# Patient Record
Sex: Female | Born: 1940 | Race: White | Hispanic: No | Smoking: Never smoker
Health system: Southern US, Community
[De-identification: ages and names within clinical notes are randomized; demographics above are authoritative.]

## PROBLEM LIST (undated history)

## (undated) DIAGNOSIS — J449 Chronic obstructive pulmonary disease, unspecified: Secondary | ICD-10-CM

## (undated) DIAGNOSIS — Z87442 Personal history of urinary calculi: Secondary | ICD-10-CM

## (undated) DIAGNOSIS — F0280 Dementia in other diseases classified elsewhere without behavioral disturbance: Secondary | ICD-10-CM

## (undated) DIAGNOSIS — G309 Alzheimer's disease, unspecified: Secondary | ICD-10-CM

## (undated) DIAGNOSIS — D72829 Elevated white blood cell count, unspecified: Secondary | ICD-10-CM

## (undated) DIAGNOSIS — M069 Rheumatoid arthritis, unspecified: Secondary | ICD-10-CM

## (undated) DIAGNOSIS — I10 Essential (primary) hypertension: Secondary | ICD-10-CM

## (undated) DIAGNOSIS — F0390 Unspecified dementia without behavioral disturbance: Secondary | ICD-10-CM

## (undated) DIAGNOSIS — R0600 Dyspnea, unspecified: Secondary | ICD-10-CM

## (undated) DIAGNOSIS — F419 Anxiety disorder, unspecified: Secondary | ICD-10-CM

## (undated) DIAGNOSIS — E876 Hypokalemia: Secondary | ICD-10-CM

## (undated) DIAGNOSIS — K5900 Constipation, unspecified: Secondary | ICD-10-CM

## (undated) DIAGNOSIS — F329 Major depressive disorder, single episode, unspecified: Secondary | ICD-10-CM

## (undated) DIAGNOSIS — M1990 Unspecified osteoarthritis, unspecified site: Secondary | ICD-10-CM

## (undated) DIAGNOSIS — D649 Anemia, unspecified: Secondary | ICD-10-CM

## (undated) DIAGNOSIS — F32A Depression, unspecified: Secondary | ICD-10-CM

## (undated) DIAGNOSIS — L409 Psoriasis, unspecified: Secondary | ICD-10-CM

## (undated) DIAGNOSIS — E538 Deficiency of other specified B group vitamins: Secondary | ICD-10-CM

## (undated) DIAGNOSIS — E559 Vitamin D deficiency, unspecified: Secondary | ICD-10-CM

## (undated) HISTORY — DX: Elevated white blood cell count, unspecified: D72.829

## (undated) HISTORY — DX: Vitamin D deficiency, unspecified: E55.9

## (undated) HISTORY — PX: TOE SURGERY: SHX1073

## (undated) HISTORY — DX: Psoriasis, unspecified: L40.9

## (undated) HISTORY — PX: APPENDECTOMY: SHX54

## (undated) HISTORY — DX: Unspecified dementia, unspecified severity, without behavioral disturbance, psychotic disturbance, mood disturbance, and anxiety: F03.90

## (undated) HISTORY — PX: OTHER SURGICAL HISTORY: SHX169

## (undated) HISTORY — DX: Dyspnea, unspecified: R06.00

---
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
## 2012-01-27 DIAGNOSIS — I10 Essential (primary) hypertension: Secondary | ICD-10-CM | POA: Diagnosis not present

## 2012-01-27 DIAGNOSIS — J449 Chronic obstructive pulmonary disease, unspecified: Secondary | ICD-10-CM | POA: Diagnosis not present

## 2012-01-27 DIAGNOSIS — R413 Other amnesia: Secondary | ICD-10-CM | POA: Diagnosis not present

## 2012-01-30 DIAGNOSIS — R413 Other amnesia: Secondary | ICD-10-CM | POA: Diagnosis not present

## 2012-01-30 DIAGNOSIS — G3184 Mild cognitive impairment, so stated: Secondary | ICD-10-CM | POA: Diagnosis not present

## 2012-02-14 DIAGNOSIS — R413 Other amnesia: Secondary | ICD-10-CM | POA: Diagnosis not present

## 2012-02-28 DIAGNOSIS — L408 Other psoriasis: Secondary | ICD-10-CM | POA: Diagnosis not present

## 2012-03-13 DIAGNOSIS — R413 Other amnesia: Secondary | ICD-10-CM | POA: Diagnosis not present

## 2012-03-13 DIAGNOSIS — R42 Dizziness and giddiness: Secondary | ICD-10-CM | POA: Diagnosis not present

## 2012-03-22 DIAGNOSIS — R42 Dizziness and giddiness: Secondary | ICD-10-CM | POA: Diagnosis not present

## 2012-05-03 DIAGNOSIS — R42 Dizziness and giddiness: Secondary | ICD-10-CM | POA: Diagnosis not present

## 2012-06-13 DIAGNOSIS — N390 Urinary tract infection, site not specified: Secondary | ICD-10-CM | POA: Diagnosis not present

## 2012-06-13 DIAGNOSIS — Z0000 Encounter for general adult medical examination without abnormal findings: Secondary | ICD-10-CM | POA: Diagnosis not present

## 2012-06-13 DIAGNOSIS — I10 Essential (primary) hypertension: Secondary | ICD-10-CM | POA: Diagnosis not present

## 2012-06-13 DIAGNOSIS — E785 Hyperlipidemia, unspecified: Secondary | ICD-10-CM | POA: Diagnosis not present

## 2012-06-13 DIAGNOSIS — R7989 Other specified abnormal findings of blood chemistry: Secondary | ICD-10-CM | POA: Diagnosis not present

## 2012-06-13 DIAGNOSIS — Z23 Encounter for immunization: Secondary | ICD-10-CM | POA: Diagnosis not present

## 2012-08-07 DIAGNOSIS — F0390 Unspecified dementia without behavioral disturbance: Secondary | ICD-10-CM | POA: Diagnosis not present

## 2012-08-10 DIAGNOSIS — F339 Major depressive disorder, recurrent, unspecified: Secondary | ICD-10-CM | POA: Diagnosis not present

## 2012-08-10 DIAGNOSIS — F429 Obsessive-compulsive disorder, unspecified: Secondary | ICD-10-CM | POA: Diagnosis not present

## 2012-08-13 DIAGNOSIS — F429 Obsessive-compulsive disorder, unspecified: Secondary | ICD-10-CM | POA: Diagnosis not present

## 2012-08-13 DIAGNOSIS — F339 Major depressive disorder, recurrent, unspecified: Secondary | ICD-10-CM | POA: Diagnosis not present

## 2012-08-22 DIAGNOSIS — F339 Major depressive disorder, recurrent, unspecified: Secondary | ICD-10-CM | POA: Diagnosis not present

## 2012-08-22 DIAGNOSIS — F429 Obsessive-compulsive disorder, unspecified: Secondary | ICD-10-CM | POA: Diagnosis not present

## 2012-08-24 DIAGNOSIS — F339 Major depressive disorder, recurrent, unspecified: Secondary | ICD-10-CM | POA: Diagnosis not present

## 2012-08-24 DIAGNOSIS — F429 Obsessive-compulsive disorder, unspecified: Secondary | ICD-10-CM | POA: Diagnosis not present

## 2012-09-13 DIAGNOSIS — F0390 Unspecified dementia without behavioral disturbance: Secondary | ICD-10-CM | POA: Diagnosis not present

## 2012-09-17 DIAGNOSIS — F411 Generalized anxiety disorder: Secondary | ICD-10-CM | POA: Diagnosis not present

## 2012-09-21 DIAGNOSIS — R413 Other amnesia: Secondary | ICD-10-CM | POA: Diagnosis not present

## 2012-09-25 DIAGNOSIS — R413 Other amnesia: Secondary | ICD-10-CM | POA: Diagnosis not present

## 2012-09-27 DIAGNOSIS — Z6834 Body mass index (BMI) 34.0-34.9, adult: Secondary | ICD-10-CM | POA: Diagnosis not present

## 2012-09-27 DIAGNOSIS — F0390 Unspecified dementia without behavioral disturbance: Secondary | ICD-10-CM | POA: Diagnosis not present

## 2012-09-27 DIAGNOSIS — J45909 Unspecified asthma, uncomplicated: Secondary | ICD-10-CM | POA: Diagnosis not present

## 2012-09-27 DIAGNOSIS — E785 Hyperlipidemia, unspecified: Secondary | ICD-10-CM | POA: Diagnosis not present

## 2012-09-27 DIAGNOSIS — I10 Essential (primary) hypertension: Secondary | ICD-10-CM | POA: Diagnosis not present

## 2012-09-27 DIAGNOSIS — IMO0002 Reserved for concepts with insufficient information to code with codable children: Secondary | ICD-10-CM | POA: Diagnosis not present

## 2012-10-16 DIAGNOSIS — R413 Other amnesia: Secondary | ICD-10-CM | POA: Diagnosis not present

## 2012-10-16 DIAGNOSIS — F411 Generalized anxiety disorder: Secondary | ICD-10-CM | POA: Diagnosis not present

## 2012-12-03 DIAGNOSIS — Z111 Encounter for screening for respiratory tuberculosis: Secondary | ICD-10-CM | POA: Diagnosis not present

## 2013-01-18 DIAGNOSIS — J45909 Unspecified asthma, uncomplicated: Secondary | ICD-10-CM | POA: Diagnosis not present

## 2013-01-18 DIAGNOSIS — F411 Generalized anxiety disorder: Secondary | ICD-10-CM | POA: Diagnosis not present

## 2013-01-18 DIAGNOSIS — E785 Hyperlipidemia, unspecified: Secondary | ICD-10-CM | POA: Diagnosis not present

## 2013-01-18 DIAGNOSIS — F0390 Unspecified dementia without behavioral disturbance: Secondary | ICD-10-CM | POA: Diagnosis not present

## 2013-01-18 DIAGNOSIS — L408 Other psoriasis: Secondary | ICD-10-CM | POA: Diagnosis not present

## 2013-05-24 DIAGNOSIS — R5381 Other malaise: Secondary | ICD-10-CM | POA: Diagnosis not present

## 2013-05-24 DIAGNOSIS — J438 Other emphysema: Secondary | ICD-10-CM | POA: Diagnosis not present

## 2013-05-24 DIAGNOSIS — I779 Disorder of arteries and arterioles, unspecified: Secondary | ICD-10-CM | POA: Diagnosis not present

## 2013-05-24 DIAGNOSIS — D72829 Elevated white blood cell count, unspecified: Secondary | ICD-10-CM | POA: Diagnosis not present

## 2013-05-24 DIAGNOSIS — M899 Disorder of bone, unspecified: Secondary | ICD-10-CM | POA: Diagnosis not present

## 2013-05-24 DIAGNOSIS — IMO0002 Reserved for concepts with insufficient information to code with codable children: Secondary | ICD-10-CM | POA: Diagnosis not present

## 2013-05-24 DIAGNOSIS — L408 Other psoriasis: Secondary | ICD-10-CM | POA: Diagnosis not present

## 2013-05-24 DIAGNOSIS — E559 Vitamin D deficiency, unspecified: Secondary | ICD-10-CM | POA: Diagnosis not present

## 2013-05-28 ENCOUNTER — Telehealth: Payer: Self-pay | Admitting: Hematology and Oncology

## 2013-05-28 NOTE — Telephone Encounter (Signed)
Left vm to pt's sister to return call on np ref

## 2013-06-03 ENCOUNTER — Telehealth: Payer: Self-pay | Admitting: Hematology and Oncology

## 2013-06-03 NOTE — Telephone Encounter (Signed)
C/D 06/03/13 for appt. 10/14

## 2013-06-03 NOTE — Telephone Encounter (Signed)
S/W PT SISTER (CAROL) AND GVE NP APPT 10/14 @ 10:30 W/DR. GORSUCH  REFERRING DR. SHARON WOLTERS DX-LEUKOCYTOSIS WELCOME PACKET MAILED.

## 2013-06-12 DIAGNOSIS — L408 Other psoriasis: Secondary | ICD-10-CM | POA: Diagnosis not present

## 2013-06-12 DIAGNOSIS — Z79899 Other long term (current) drug therapy: Secondary | ICD-10-CM | POA: Diagnosis not present

## 2013-06-25 ENCOUNTER — Telehealth: Payer: Self-pay | Admitting: Hematology and Oncology

## 2013-06-25 ENCOUNTER — Encounter: Payer: Self-pay | Admitting: Hematology and Oncology

## 2013-06-25 ENCOUNTER — Ambulatory Visit: Payer: Medicare Other

## 2013-06-25 ENCOUNTER — Ambulatory Visit (HOSPITAL_BASED_OUTPATIENT_CLINIC_OR_DEPARTMENT_OTHER): Payer: Medicare Other | Admitting: Lab

## 2013-06-25 ENCOUNTER — Ambulatory Visit (HOSPITAL_BASED_OUTPATIENT_CLINIC_OR_DEPARTMENT_OTHER): Payer: Medicare Other | Admitting: Hematology and Oncology

## 2013-06-25 ENCOUNTER — Ambulatory Visit (HOSPITAL_COMMUNITY)
Admission: RE | Admit: 2013-06-25 | Discharge: 2013-06-25 | Disposition: A | Discharge status: Home / Self Care | Payer: Medicare Other | Source: Ambulatory Visit | Attending: Hematology and Oncology | Admitting: Hematology and Oncology

## 2013-06-25 VITALS — BP 155/74 | HR 163 | Temp 97.8°F | Resp 20 | Ht 63.0 in | Wt 217.2 lb

## 2013-06-25 DIAGNOSIS — R0600 Dyspnea, unspecified: Secondary | ICD-10-CM

## 2013-06-25 DIAGNOSIS — D473 Essential (hemorrhagic) thrombocythemia: Secondary | ICD-10-CM

## 2013-06-25 DIAGNOSIS — D72829 Elevated white blood cell count, unspecified: Secondary | ICD-10-CM

## 2013-06-25 DIAGNOSIS — Z23 Encounter for immunization: Secondary | ICD-10-CM | POA: Diagnosis not present

## 2013-06-25 DIAGNOSIS — R0609 Other forms of dyspnea: Secondary | ICD-10-CM

## 2013-06-25 DIAGNOSIS — R0989 Other specified symptoms and signs involving the circulatory and respiratory systems: Secondary | ICD-10-CM

## 2013-06-25 DIAGNOSIS — D75839 Thrombocytosis, unspecified: Secondary | ICD-10-CM

## 2013-06-25 DIAGNOSIS — E559 Vitamin D deficiency, unspecified: Secondary | ICD-10-CM

## 2013-06-25 HISTORY — DX: Vitamin D deficiency, unspecified: E55.9

## 2013-06-25 HISTORY — DX: Elevated white blood cell count, unspecified: D72.829

## 2013-06-25 HISTORY — DX: Dyspnea, unspecified: R06.00

## 2013-06-25 LAB — COMPREHENSIVE METABOLIC PANEL (CC13)
AST: 11 U/L (ref 5–34)
Anion Gap: 10 mEq/L (ref 3–11)
CO2: 25 mEq/L (ref 22–29)
Creatinine: 0.8 mg/dL (ref 0.6–1.1)
Glucose: 108 mg/dl (ref 70–140)
Potassium: 4.1 mEq/L (ref 3.5–5.1)
Total Bilirubin: 0.45 mg/dL (ref 0.20–1.20)
Total Protein: 7.2 g/dL (ref 6.4–8.3)

## 2013-06-25 LAB — URINALYSIS, MICROSCOPIC - CHCC
Bilirubin (Urine): NEGATIVE
Ketones: NEGATIVE mg/dL
Leukocyte Esterase: NEGATIVE
Nitrite: NEGATIVE
Protein: 30 mg/dL

## 2013-06-25 LAB — CBC WITH DIFFERENTIAL/PLATELET
BASO%: 0.8 % (ref 0.0–2.0)
EOS%: 1 % (ref 0.0–7.0)
HCT: 41.3 % (ref 34.8–46.6)
MCH: 28.5 pg (ref 25.1–34.0)
MCHC: 32.3 g/dL (ref 31.5–36.0)
NEUT#: 14.5 10*3/uL — ABNORMAL HIGH (ref 1.5–6.5)
RDW: 15.2 % — ABNORMAL HIGH (ref 11.2–14.5)
WBC: 17.4 10*3/uL — ABNORMAL HIGH (ref 3.9–10.3)
lymph#: 2 10*3/uL (ref 0.9–3.3)

## 2013-06-25 LAB — MORPHOLOGY

## 2013-06-25 LAB — SEDIMENTATION RATE: Sed Rate: 23 mm/hr — ABNORMAL HIGH (ref 0–22)

## 2013-06-25 LAB — FERRITIN CHCC: Ferritin: 39 ng/ml (ref 9–269)

## 2013-06-25 MED ORDER — INFLUENZA VAC SPLIT QUAD 0.5 ML IM SUSP
0.5000 mL | Freq: Once | INTRAMUSCULAR | Status: AC
  Administered 2013-06-25: 0.5 mL via INTRAMUSCULAR
  Filled 2013-06-25: qty 0.5

## 2013-06-25 MED ORDER — VITAMIN D 1000 UNITS PO TABS: 1000.0000 [IU] | ORAL_TABLET | Freq: Every day | ORAL | Status: DC

## 2013-06-25 NOTE — Progress Notes (Signed)
[facility] CONSULT NOTE  Patient Care Team: Emeterio Reeve, MD as PCP - General (Family Medicine)  CHIEF COMPLAINTS/PURPOSE OF CONSULTATION:  Leukocytosis and thrombocytosis  HISTORY OF PRESENTING ILLNESS:  Danielle Dixon 72 y.o. female is here because of abnormal CBC. The opportunity to review her blood test data back from 2010. Over the past few years, she had fluctuation of her white count and platelet count. It ranges between normal and mildly elevated. For the platelet count, it ranges from 362-459. On her white blood cell count, it ranges from 9.7-14.6. Leukocytosis is predominantly neutrophilia. The patient had been suffering from psoriasis and arthritis for at least 5 years or more. She has been placed on topical corticosteroid cream predominantly, and most recently had received a steroid Dosepak and methotrexate. She denies any recent infection such as cough, nasal drainage, diarrhea or, her urinary tract infection symptoms such as dysuria, frequency, or urgency. In terms of the leukocytosis, she never had any prior diagnosis of blood clot. She have chronic shortness breath but was told she had emphysema due to passive secondhand smoking for a long time. Her most recent CBC from 05/24/2013 shoulder, 16.4 hemoglobin of 13.4 and a platelet count 426. Serum chemistries showed elevated alkaline phosphatase at 151 and the low serum vitamin D at 7.4.  MEDICAL HISTORY:  Past Medical History  Diagnosis Date  . Psoriasis   . Dementia   . Leukocytosis   . Leukocytosis, unspecified 06/25/2013  . Leukocytosis, unspecified 06/25/2013  . Dyspnea 06/25/2013  . Unspecified vitamin D deficiency 06/25/2013    SURGICAL HISTORY: Past Surgical History  Procedure Laterality Date  . Knee replacment Left   . Toe surgery    . Appendectomy      SOCIAL HISTORY: History   Social History  . Marital Status: Divorced    Spouse Name: N/A    Number of Children: N/A  . Years of  Education: N/A   Occupational History  . Not on file.   Social History Main Topics  . Smoking status: Never Smoker   . Smokeless tobacco: Not on file  . Alcohol Use: No  . Drug Use: No  . Sexual Activity: Not on file   Other Topics Concern  . Not on file   Social History Narrative  . No narrative on file    FAMILY HISTORY: Family History  Problem Relation Age of Onset  . Cancer Mother     breast cancer  . Cancer Father     lung    ALLERGIES:  has no allergies on file.  MEDICATIONS:  Current Outpatient Prescriptions  Medication Sig Dispense Refill  . ALPRAZolam (XANAX) 0.25 MG tablet Take 0.25 mg by mouth at bedtime as needed for sleep. Unsure of dose      . aspirin 81 MG tablet Take 81 mg by mouth daily.      Marland Kitchen atenolol (TENORMIN) 25 MG tablet Take 25 mg by mouth daily.      . cholecalciferol (VITAMIN D) 1000 UNITS tablet Take 1 tablet (1,000 Units total) by mouth daily.  30 tablet  3  . clobetasol cream (TEMOVATE) 0.05 % Apply topically 2 (two) times daily. To scalp      . donepezil (ARICEPT) 10 MG tablet Take 10 mg by mouth at bedtime as needed. Unsure of dose      . folic acid (FOLVITE) 1 MG tablet Take 1 mg by mouth daily.      . methotrexate (RHEUMATREX) 2.5 MG tablet Take  7.5 mg by mouth 3 (three) times a week.      Marland Kitchen PARoxetine HCl (PAXIL PO) Take by mouth.      . simvastatin (ZOCOR) 10 MG tablet Take 10 mg by mouth at bedtime. Unsure of dose      . zafirlukast (ACCOLATE) 20 MG tablet Take 20 mg by mouth 2 (two) times daily.       No current facility-administered medications for this visit.    REVIEW OF SYSTEMS:   Constitutional: Denies fevers, chills or abnormal night sweats Eyes: Denies blurriness of vision, double vision or watery eyes Ears, nose, mouth, throat, and face: Denies mucositis or sore throat Cardiovascular: Denies palpitation, chest discomfort or lower extremity swelling Gastrointestinal:  Denies nausea, heartburn or change in bowel  habits Lymphatics: Denies new lymphadenopathy or easy bruising Neurological:Denies numbness, tingling or new weaknesses Behavioral/Psych: Mood is stable, no new changes  All other systems were reviewed with the patient and are negative.  PHYSICAL EXAMINATION: ECOG PERFORMANCE STATUS: 1 - Symptomatic but completely ambulatory  Filed Vitals:   06/25/13 1050  BP: 155/74  Pulse: 163  Temp: 97.8 F (36.6 C)  Resp: 20   Filed Weights   06/25/13 1050  Weight: 217 lb 3.2 oz (98.521 kg)    GENERAL:alert, no distress and comfortable SKIN: noted patchy skin rashes resembling psoriasis OROPHARYNX:no exudate, no erythema and lips, buccal mucosa, and tongue normal  NECK: supple, thyroid normal size, non-tender, without nodularity LYMPH:  no palpable lymphadenopathy in the cervical, axillary or inguinal LUNGS: clear to auscultation and percussion with normal breathing effort HEART: regular rate & rhythm and no murmurs and no lower extremity edema ABDOMEN:abdomen soft, non-tender and normal bowel sounds Musculoskeletal:no cyanosis of digits and no clubbing  PSYCH: alert & oriented x 3 with fluent speech NEURO: no focal motor/sensory deficits  LABORATORY DATA:  I have reviewed the data as listed  ASSESSMENT:  leukocytosis and thrombocytosis a background history of psoriasis   PLAN:  #1 leukocytosis It is predominantly a neutrophilia. This is likely to be reactive in nature. A medication such as the topical steroid cream sometimes can have mild absorption and that could cause brief leukocytosis as well. The patient is having some mild shortness of breath with background history of emphysema and I would recommend ordering a chest x-ray for evaluation. It is not uncommon for patients like her could have asymptomatic urinary tract infection and I will order a urinalysis as well. At present time she is relatively asymptomatic. We will observe for now until a figure out the cause of this #2  thrombocytosis This again could be reactive in nature. Sometimes mired deficiency can also cause reactive thrombocytosis. I will order some iron study for this #3 psoriasis #4 arthritis The most recent provider visits suggested she might have a flare of arthritis and psoriasis. I would check a set rate to evaluate. Certainly this could cause abnormal CBC as described above #5 shortness of breath I will order a chest x-ray to make sure that she has not has any abnormal infiltrate or pleural effusion. She's been placed on methotrexate that sometimes accumulate in her body due to third spacing.  We discussed the importance of preventive care and reviewed the vaccination programs. She does not have any prior allergic reactions to influenza vaccination. She agrees to proceed with influenza vaccination today and we will administer it today at the clinic.  Orders Placed This Encounter  Procedures  . DG Chest 2 View    Standing  Status: Future     Number of Occurrences:      Standing Expiration Date: 06/25/2014    Order Specific Question:  Reason for Exam (SYMPTOM  OR DIAGNOSIS REQUIRED)    Answer:  dyspnea, Hx emphysema    Order Specific Question:  Preferred imaging location?    Answer:  [facility]  . CBC with Differential    Standing Status: Future     Number of Occurrences: 1     Standing Expiration Date: 06/25/2014  . Smear    Standing Status: Future     Number of Occurrences: 1     Standing Expiration Date: 06/25/2014  . Morphology    Standing Status: Future     Number of Occurrences: 1     Standing Expiration Date: 06/25/2014  . Ferritin    Standing Status: Future     Number of Occurrences: 1     Standing Expiration Date: 06/25/2014  . Comprehensive metabolic panel    Standing Status: Future     Number of Occurrences: 1     Standing Expiration Date: 06/25/2014  . ANA w/Reflex if Positive    Standing Status: Future     Number of Occurrences: 1     Standing Expiration  Date: 06/25/2014  . Sedimentation rate    Standing Status: Future     Number of Occurrences: 1     Standing Expiration Date: 06/25/2014  . Urinalysis, Microscopic - [facility]    Standing Status: Future     Number of Occurrences: 1     Standing Expiration Date: 06/25/2014    All questions were answered. The patient knows to call the clinic with any problems, questions or concerns. I spent 40 minutes counseling the patient face to face. The total time spent in the appointment was 60 minutes and more than 50% was on counseling.     Sausha Raymond, MD 06/25/2013 12:06 PM

## 2013-06-25 NOTE — Telephone Encounter (Signed)
gv and printed appt sched and avs for pt for OCT...sent pt back to lab and radiology

## 2013-06-25 NOTE — Progress Notes (Signed)
Checked in new pt with no financial concerns. °

## 2013-07-08 ENCOUNTER — Telehealth: Payer: Self-pay | Admitting: *Deleted

## 2013-07-08 DIAGNOSIS — F0280 Dementia in other diseases classified elsewhere without behavioral disturbance: Secondary | ICD-10-CM | POA: Diagnosis not present

## 2013-07-08 DIAGNOSIS — M6281 Muscle weakness (generalized): Secondary | ICD-10-CM | POA: Diagnosis not present

## 2013-07-08 DIAGNOSIS — I10 Essential (primary) hypertension: Secondary | ICD-10-CM | POA: Diagnosis not present

## 2013-07-08 NOTE — Telephone Encounter (Signed)
S/w pt's sister, Okey Regal.  Asked her to bring pt tomorrow at 3 pm instead of 3:15 pm as scheduled to help adjust for a new patient add on.  She agreed.

## 2013-07-09 ENCOUNTER — Ambulatory Visit (HOSPITAL_BASED_OUTPATIENT_CLINIC_OR_DEPARTMENT_OTHER): Payer: Medicare Other | Admitting: Hematology and Oncology

## 2013-07-09 ENCOUNTER — Telehealth: Payer: Self-pay | Admitting: *Deleted

## 2013-07-09 ENCOUNTER — Encounter: Payer: Self-pay | Admitting: Hematology and Oncology

## 2013-07-09 VITALS — BP 127/72 | HR 59 | Temp 97.1°F | Resp 20 | Ht 63.0 in | Wt 216.3 lb

## 2013-07-09 DIAGNOSIS — D473 Essential (hemorrhagic) thrombocythemia: Secondary | ICD-10-CM

## 2013-07-09 DIAGNOSIS — D72829 Elevated white blood cell count, unspecified: Secondary | ICD-10-CM | POA: Diagnosis not present

## 2013-07-09 NOTE — Telephone Encounter (Signed)
appts made and printed...td 

## 2013-07-09 NOTE — Progress Notes (Signed)
[facility] OFFICE PROGRESS NOTE  Emeterio Reeve, MD DIAGNOSIS:  Leukocytosis and thrombocytosis  SUMMARY OF HEMATOLOGIC HISTORY: Please see my previous dictation dictation 2 weeks ago for further details. This patient was referred here because of chronic leukocytosis and thrombocytosis INTERVAL HISTORY: Danielle Dixon 72 y.o. female returns for review of test results. In the meantime she is doing well.She denies any recent fever, chills, night sweats or abnormal weight loss   I have reviewed the past medical history, past surgical history, social history and family history with the patient and they are unchanged from previous note.  ALLERGIES:  has No Known Allergies.  MEDICATIONS:  Current Outpatient Prescriptions  Medication Sig Dispense Refill  . ALPRAZolam (XANAX) 0.25 MG tablet Take 0.25 mg by mouth at bedtime as needed for sleep. Unsure of dose      . aspirin 81 MG tablet Take 81 mg by mouth daily.      Marland Kitchen atenolol (TENORMIN) 25 MG tablet Take 25 mg by mouth daily.      . cholecalciferol (VITAMIN D) 1000 UNITS tablet Take 1 tablet (1,000 Units total) by mouth daily.  30 tablet  3  . clobetasol cream (TEMOVATE) 0.05 % Apply topically 2 (two) times daily. To scalp      . donepezil (ARICEPT) 10 MG tablet Take 10 mg by mouth at bedtime as needed. Unsure of dose      . folic acid (FOLVITE) 1 MG tablet Take 1 mg by mouth daily.      . methotrexate (RHEUMATREX) 2.5 MG tablet Take 7.5 mg by mouth 3 (three) times a week.      Marland Kitchen PARoxetine HCl (PAXIL PO) Take by mouth.      . simvastatin (ZOCOR) 10 MG tablet Take 10 mg by mouth at bedtime. Unsure of dose      . zafirlukast (ACCOLATE) 20 MG tablet Take 20 mg by mouth 2 (two) times daily.       No current facility-administered medications for this visit.     REVIEW OF SYSTEMS:   All other systems were reviewed with the patient and are negative.  PHYSICAL EXAMINATION: ECOG PERFORMANCE STATUS: 1 - Symptomatic but  completely ambulatory  Filed Vitals:   07/09/13 1437  BP: 127/72  Pulse: 59  Temp: 97.1 F (36.2 C)  Resp: 20   Filed Weights   07/09/13 1437  Weight: 216 lb 4.8 oz (98.113 kg)    GENERAL:alert, no distress and comfortable   NEURO: alert & oriented x 3 with fluent speech, no focal motor/sensory deficits  LABORATORY DATA:  I have reviewed the data as listed. I reviewed all the test results with her and family members. RADIOGRAPHIC STUDIES: I have personally reviewed the radiological images as listed and agreed with the findings in the report. Chest x-ray is normal  ASSESSMENT:  #1 leukocytosis #2 thrombocytosis  PLAN:  #1 leukocytosis Suspect this is benign, likely reactive in nature given her history of systemic inflammatory condition from psoriasis. Recommend observation for now. #2 thrombocytosis Again this is likely reactive in nature. Her ferritin is borderline low. I recommend oral multivitamin supplements. I recommend the patient return in 3 months with repeat blood work. In the meantime I do not feel the need for access of testing and that you've from a bone marrow biopsy would be low.  All questions were answered. The patient knows to call the clinic with any problems, questions or concerns. No barriers to learning was detected.  I spent 15 minutes  counseling the patient face to face. The total time spent in the appointment was 20 minutes and more than 50% was on counseling.     [facility], [name], MD 07/09/2013 9:06 PM

## 2013-10-09 ENCOUNTER — Telehealth: Payer: Self-pay | Admitting: Hematology and Oncology

## 2013-10-09 ENCOUNTER — Ambulatory Visit (HOSPITAL_BASED_OUTPATIENT_CLINIC_OR_DEPARTMENT_OTHER): Payer: Medicare Other | Admitting: Hematology and Oncology

## 2013-10-09 ENCOUNTER — Other Ambulatory Visit (HOSPITAL_BASED_OUTPATIENT_CLINIC_OR_DEPARTMENT_OTHER): Payer: Medicare Other

## 2013-10-09 VITALS — BP 145/96 | HR 93 | Temp 98.0°F | Resp 18 | Ht 63.0 in | Wt 222.9 lb

## 2013-10-09 DIAGNOSIS — L408 Other psoriasis: Secondary | ICD-10-CM

## 2013-10-09 DIAGNOSIS — J45909 Unspecified asthma, uncomplicated: Secondary | ICD-10-CM | POA: Diagnosis not present

## 2013-10-09 DIAGNOSIS — D473 Essential (hemorrhagic) thrombocythemia: Secondary | ICD-10-CM

## 2013-10-09 DIAGNOSIS — D72829 Elevated white blood cell count, unspecified: Secondary | ICD-10-CM | POA: Diagnosis not present

## 2013-10-09 DIAGNOSIS — D75839 Thrombocytosis, unspecified: Secondary | ICD-10-CM

## 2013-10-09 LAB — CBC WITH DIFFERENTIAL/PLATELET
BASO%: 0.2 % (ref 0.0–2.0)
Basophils Absolute: 0 10*3/uL (ref 0.0–0.1)
EOS%: 2.4 % (ref 0.0–7.0)
Eosinophils Absolute: 0.3 10*3/uL (ref 0.0–0.5)
HCT: 42.3 % (ref 34.8–46.6)
HGB: 13.7 g/dL (ref 11.6–15.9)
LYMPH%: 19 % (ref 14.0–49.7)
MCH: 30.7 pg (ref 25.1–34.0)
MCHC: 32.5 g/dL (ref 31.5–36.0)
MCV: 94.5 fL (ref 79.5–101.0)
MONO#: 0.5 10*3/uL (ref 0.1–0.9)
MONO%: 3.6 % (ref 0.0–14.0)
NEUT#: 10.6 10*3/uL — ABNORMAL HIGH (ref 1.5–6.5)
NEUT%: 74.8 % (ref 38.4–76.8)
Platelets: 459 10*3/uL — ABNORMAL HIGH (ref 145–400)
RBC: 4.47 10*6/uL (ref 3.70–5.45)
RDW: 17.4 % — ABNORMAL HIGH (ref 11.2–14.5)
WBC: 14.2 10*3/uL — AB (ref 3.9–10.3)
lymph#: 2.7 10*3/uL (ref 0.9–3.3)

## 2013-10-09 LAB — TECHNOLOGIST REVIEW

## 2013-10-09 LAB — SEDIMENTATION RATE: Sed Rate: 32 mm/hr — ABNORMAL HIGH (ref 0–22)

## 2013-10-09 LAB — FERRITIN CHCC: FERRITIN: 28 ng/mL (ref 9–269)

## 2013-10-09 NOTE — Telephone Encounter (Signed)
gave pt appt for lab and MD on September 2015 °

## 2013-10-09 NOTE — Progress Notes (Signed)
[facility] OFFICE PROGRESS NOTE  Danielle Coma, MD DIAGNOSIS:  Chronic leukocytosis and thrombocytosis  SUMMARY OF HEMATOLOGIC HISTORY: This is a patient was being referred here because of chronic leukocytosis and thrombocytosis. The patient is not symptomatic and she was being observed INTERVAL HISTORY: Danielle Dixon 73 y.o. female returns for further followup. She is doing well with no recent infection. She denies any recent fever, chills, night sweats or abnormal weight loss. She has chronic asthma but no recent asthmatic attack  I have reviewed the past medical history, past surgical history, social history and family history with the patient and they are unchanged from previous note.  ALLERGIES:  has No Known Allergies.  MEDICATIONS:  Current Outpatient Prescriptions  Medication Sig Dispense Refill  . ALPRAZolam (XANAX) 0.25 MG tablet Take 0.25 mg by mouth at bedtime as needed for sleep. Unsure of dose      . aspirin 81 MG tablet Take 81 mg by mouth daily.      Marland Kitchen atenolol (TENORMIN) 25 MG tablet Take 25 mg by mouth daily.      . cholecalciferol (VITAMIN D) 1000 UNITS tablet Take 1 tablet (1,000 Units total) by mouth daily.  30 tablet  3  . clobetasol cream (TEMOVATE) 0.05 % Apply topically 2 (two) times daily. To scalp      . donepezil (ARICEPT) 10 MG tablet Take 10 mg by mouth at bedtime as needed. Unsure of dose      . folic acid (FOLVITE) 1 MG tablet Take 1 mg by mouth daily.      . methotrexate (RHEUMATREX) 2.5 MG tablet Take 7.5 mg by mouth 3 (three) times a week.      Marland Kitchen PARoxetine HCl (PAXIL PO) Take by mouth.      . simvastatin (ZOCOR) 10 MG tablet Take 10 mg by mouth at bedtime. Unsure of dose      . zafirlukast (ACCOLATE) 20 MG tablet Take 20 mg by mouth 2 (two) times daily.       No current facility-administered medications for this visit.     REVIEW OF SYSTEMS:   Constitutional: Denies fevers, chills or night sweats Behavioral/Psych: Mood is  stable, no new changes  All other systems were reviewed with the patient and are negative.  PHYSICAL EXAMINATION: ECOG PERFORMANCE STATUS: 1 - Symptomatic but completely ambulatory  Filed Vitals:   10/09/13 1423  BP: 145/96  Pulse: 93  Temp: 98 F (36.7 C)  Resp: 18   Filed Weights   10/09/13 1423  Weight: 222 lb 14.4 oz (101.107 kg)    GENERAL:alert, no distress and comfortable. She is morbidly obese SKIN: skin color, texture, turgor are normal, no rashes or significant lesions LUNGS: clear to auscultation with occasional wheezes with normal breathing effort NEURO: alert & oriented x 3 with fluent speech, no focal motor/sensory deficits  LABORATORY DATA:  I have reviewed the data as listed Results for orders placed in visit on 10/09/13 (from the past 48 hour(s))  CBC WITH DIFFERENTIAL     Status: Abnormal   Collection Time    10/09/13  2:07 PM      Result Value Range   WBC 14.2 (*) 3.9 - 10.3 10e3/uL   NEUT# 10.6 (*) 1.5 - 6.5 10e3/uL   HGB 13.7  11.6 - 15.9 g/dL   HCT 42.3  34.8 - 46.6 %   Platelets 459 (*) 145 - 400 10e3/uL   MCV 94.5  79.5 - 101.0 fL   MCH 30.7  25.1 - 34.0 pg   MCHC 32.5  31.5 - 36.0 g/dL   RBC 4.47  3.70 - 5.45 10e6/uL   RDW 17.4 (*) 11.2 - 14.5 %   lymph# 2.7  0.9 - 3.3 10e3/uL   MONO# 0.5  0.1 - 0.9 10e3/uL   Eosinophils Absolute 0.3  0.0 - 0.5 10e3/uL   Basophils Absolute 0.0  0.0 - 0.1 10e3/uL   NEUT% 74.8  38.4 - 76.8 %   LYMPH% 19.0  14.0 - 49.7 %   MONO% 3.6  0.0 - 14.0 %   EOS% 2.4  0.0 - 7.0 %   BASO% 0.2  0.0 - 2.0 %  TECHNOLOGIST REVIEW     Status: None   Collection Time    10/09/13  2:07 PM      Result Value Range   Technologist Review Variant lymphs present      Lab Results  Component Value Date   WBC 14.2* 10/09/2013   HGB 13.7 10/09/2013   HCT 42.3 10/09/2013   MCV 94.5 10/09/2013   PLT 459* 10/09/2013   ASSESSMENT & PLAN:  #1 leukocytosis #2 chronic psoriasis #3 chronic asthma I suspect the cause of her leukocytosis  is benign, likely reactive in nature given her history of systemic inflammatory condition from psoriasis. Recommend observation for now. #4 thrombocytosis Again this is likely reactive in nature. Her ferritin is borderline low. I recommend oral multivitamin supplements. I recommend the patient return in 6 months with repeat blood work. In the meantime I do not feel the need for excessive testing and the yield from a bone marrow biopsy would be low. Julaine Hua is recommended to take aspirin 81 mg daily All questions were answered. The patient knows to call the clinic with any problems, questions or concerns. No barriers to learning was detected.  I spent 15 minutes counseling the patient face to face. The total time spent in the appointment was 20 minutes and more than 50% was on counseling.     [facility], [place], MD 10/09/2013 3:06 PM

## 2014-02-11 DIAGNOSIS — Z79899 Other long term (current) drug therapy: Secondary | ICD-10-CM | POA: Diagnosis not present

## 2014-02-11 DIAGNOSIS — L408 Other psoriasis: Secondary | ICD-10-CM | POA: Diagnosis not present

## 2014-02-28 DIAGNOSIS — L8990 Pressure ulcer of unspecified site, unspecified stage: Secondary | ICD-10-CM | POA: Diagnosis not present

## 2014-03-04 DIAGNOSIS — I10 Essential (primary) hypertension: Secondary | ICD-10-CM | POA: Diagnosis not present

## 2014-03-04 DIAGNOSIS — F3289 Other specified depressive episodes: Secondary | ICD-10-CM | POA: Diagnosis not present

## 2014-03-04 DIAGNOSIS — J438 Other emphysema: Secondary | ICD-10-CM | POA: Diagnosis not present

## 2014-03-04 DIAGNOSIS — M47814 Spondylosis without myelopathy or radiculopathy, thoracic region: Secondary | ICD-10-CM | POA: Diagnosis not present

## 2014-03-04 DIAGNOSIS — F329 Major depressive disorder, single episode, unspecified: Secondary | ICD-10-CM | POA: Diagnosis not present

## 2014-03-04 DIAGNOSIS — L408 Other psoriasis: Secondary | ICD-10-CM | POA: Diagnosis not present

## 2014-03-04 DIAGNOSIS — F0390 Unspecified dementia without behavioral disturbance: Secondary | ICD-10-CM | POA: Diagnosis not present

## 2014-03-04 DIAGNOSIS — Z9181 History of falling: Secondary | ICD-10-CM | POA: Diagnosis not present

## 2014-03-11 DIAGNOSIS — F3289 Other specified depressive episodes: Secondary | ICD-10-CM | POA: Diagnosis not present

## 2014-03-11 DIAGNOSIS — M47814 Spondylosis without myelopathy or radiculopathy, thoracic region: Secondary | ICD-10-CM | POA: Diagnosis not present

## 2014-03-11 DIAGNOSIS — I10 Essential (primary) hypertension: Secondary | ICD-10-CM | POA: Diagnosis not present

## 2014-03-11 DIAGNOSIS — F329 Major depressive disorder, single episode, unspecified: Secondary | ICD-10-CM | POA: Diagnosis not present

## 2014-03-11 DIAGNOSIS — J438 Other emphysema: Secondary | ICD-10-CM | POA: Diagnosis not present

## 2014-03-11 DIAGNOSIS — L408 Other psoriasis: Secondary | ICD-10-CM | POA: Diagnosis not present

## 2014-03-11 DIAGNOSIS — F0390 Unspecified dementia without behavioral disturbance: Secondary | ICD-10-CM | POA: Diagnosis not present

## 2014-03-12 DIAGNOSIS — F0390 Unspecified dementia without behavioral disturbance: Secondary | ICD-10-CM | POA: Diagnosis not present

## 2014-03-12 DIAGNOSIS — L408 Other psoriasis: Secondary | ICD-10-CM | POA: Diagnosis not present

## 2014-03-12 DIAGNOSIS — I10 Essential (primary) hypertension: Secondary | ICD-10-CM | POA: Diagnosis not present

## 2014-03-12 DIAGNOSIS — F329 Major depressive disorder, single episode, unspecified: Secondary | ICD-10-CM | POA: Diagnosis not present

## 2014-03-12 DIAGNOSIS — J438 Other emphysema: Secondary | ICD-10-CM | POA: Diagnosis not present

## 2014-03-12 DIAGNOSIS — M47814 Spondylosis without myelopathy or radiculopathy, thoracic region: Secondary | ICD-10-CM | POA: Diagnosis not present

## 2014-03-12 DIAGNOSIS — F3289 Other specified depressive episodes: Secondary | ICD-10-CM | POA: Diagnosis not present

## 2014-03-13 DIAGNOSIS — F0390 Unspecified dementia without behavioral disturbance: Secondary | ICD-10-CM | POA: Diagnosis not present

## 2014-03-13 DIAGNOSIS — F329 Major depressive disorder, single episode, unspecified: Secondary | ICD-10-CM | POA: Diagnosis not present

## 2014-03-13 DIAGNOSIS — J438 Other emphysema: Secondary | ICD-10-CM | POA: Diagnosis not present

## 2014-03-13 DIAGNOSIS — L408 Other psoriasis: Secondary | ICD-10-CM | POA: Diagnosis not present

## 2014-03-13 DIAGNOSIS — F3289 Other specified depressive episodes: Secondary | ICD-10-CM | POA: Diagnosis not present

## 2014-03-18 DIAGNOSIS — L408 Other psoriasis: Secondary | ICD-10-CM | POA: Diagnosis not present

## 2014-03-18 DIAGNOSIS — F3289 Other specified depressive episodes: Secondary | ICD-10-CM | POA: Diagnosis not present

## 2014-03-18 DIAGNOSIS — F329 Major depressive disorder, single episode, unspecified: Secondary | ICD-10-CM | POA: Diagnosis not present

## 2014-03-18 DIAGNOSIS — F0390 Unspecified dementia without behavioral disturbance: Secondary | ICD-10-CM | POA: Diagnosis not present

## 2014-03-18 DIAGNOSIS — J438 Other emphysema: Secondary | ICD-10-CM | POA: Diagnosis not present

## 2014-03-18 DIAGNOSIS — M47814 Spondylosis without myelopathy or radiculopathy, thoracic region: Secondary | ICD-10-CM | POA: Diagnosis not present

## 2014-03-18 DIAGNOSIS — I10 Essential (primary) hypertension: Secondary | ICD-10-CM | POA: Diagnosis not present

## 2014-03-21 DIAGNOSIS — I10 Essential (primary) hypertension: Secondary | ICD-10-CM | POA: Diagnosis not present

## 2014-03-21 DIAGNOSIS — M47814 Spondylosis without myelopathy or radiculopathy, thoracic region: Secondary | ICD-10-CM | POA: Diagnosis not present

## 2014-03-21 DIAGNOSIS — F0390 Unspecified dementia without behavioral disturbance: Secondary | ICD-10-CM | POA: Diagnosis not present

## 2014-03-21 DIAGNOSIS — F329 Major depressive disorder, single episode, unspecified: Secondary | ICD-10-CM | POA: Diagnosis not present

## 2014-03-21 DIAGNOSIS — L408 Other psoriasis: Secondary | ICD-10-CM | POA: Diagnosis not present

## 2014-03-21 DIAGNOSIS — F3289 Other specified depressive episodes: Secondary | ICD-10-CM | POA: Diagnosis not present

## 2014-03-21 DIAGNOSIS — J438 Other emphysema: Secondary | ICD-10-CM | POA: Diagnosis not present

## 2014-05-08 DIAGNOSIS — L408 Other psoriasis: Secondary | ICD-10-CM | POA: Diagnosis not present

## 2014-05-08 DIAGNOSIS — Z79899 Other long term (current) drug therapy: Secondary | ICD-10-CM | POA: Diagnosis not present

## 2014-06-09 ENCOUNTER — Other Ambulatory Visit: Payer: Medicare Other

## 2014-06-09 ENCOUNTER — Telehealth: Payer: Self-pay | Admitting: Hematology and Oncology

## 2014-06-09 ENCOUNTER — Ambulatory Visit: Payer: Medicare Other | Admitting: Hematology and Oncology

## 2014-06-09 NOTE — Telephone Encounter (Signed)
Pt's sister called states pt is not doing well, r/s and sister confirmed next apt..... KJ

## 2014-06-12 ENCOUNTER — Ambulatory Visit: Payer: Medicare Other | Admitting: Hematology and Oncology

## 2014-06-12 ENCOUNTER — Other Ambulatory Visit: Payer: Medicare Other

## 2014-06-26 ENCOUNTER — Other Ambulatory Visit: Payer: Medicare Other

## 2014-06-26 ENCOUNTER — Telehealth: Payer: Self-pay | Admitting: Hematology and Oncology

## 2014-06-26 ENCOUNTER — Ambulatory Visit: Payer: Medicare Other | Admitting: Hematology and Oncology

## 2014-06-26 NOTE — Telephone Encounter (Signed)
pt called to r/s ......done...pt aware of new d.t °

## 2014-07-06 IMAGING — CR DG CHEST 2V
2 series · 2 of 2 positions shown · non-contrast
Comparison: None.

CLINICAL DATA: Unspecified leukocytosis.

EXAM:
CHEST  2 VIEW

[w chest pa]
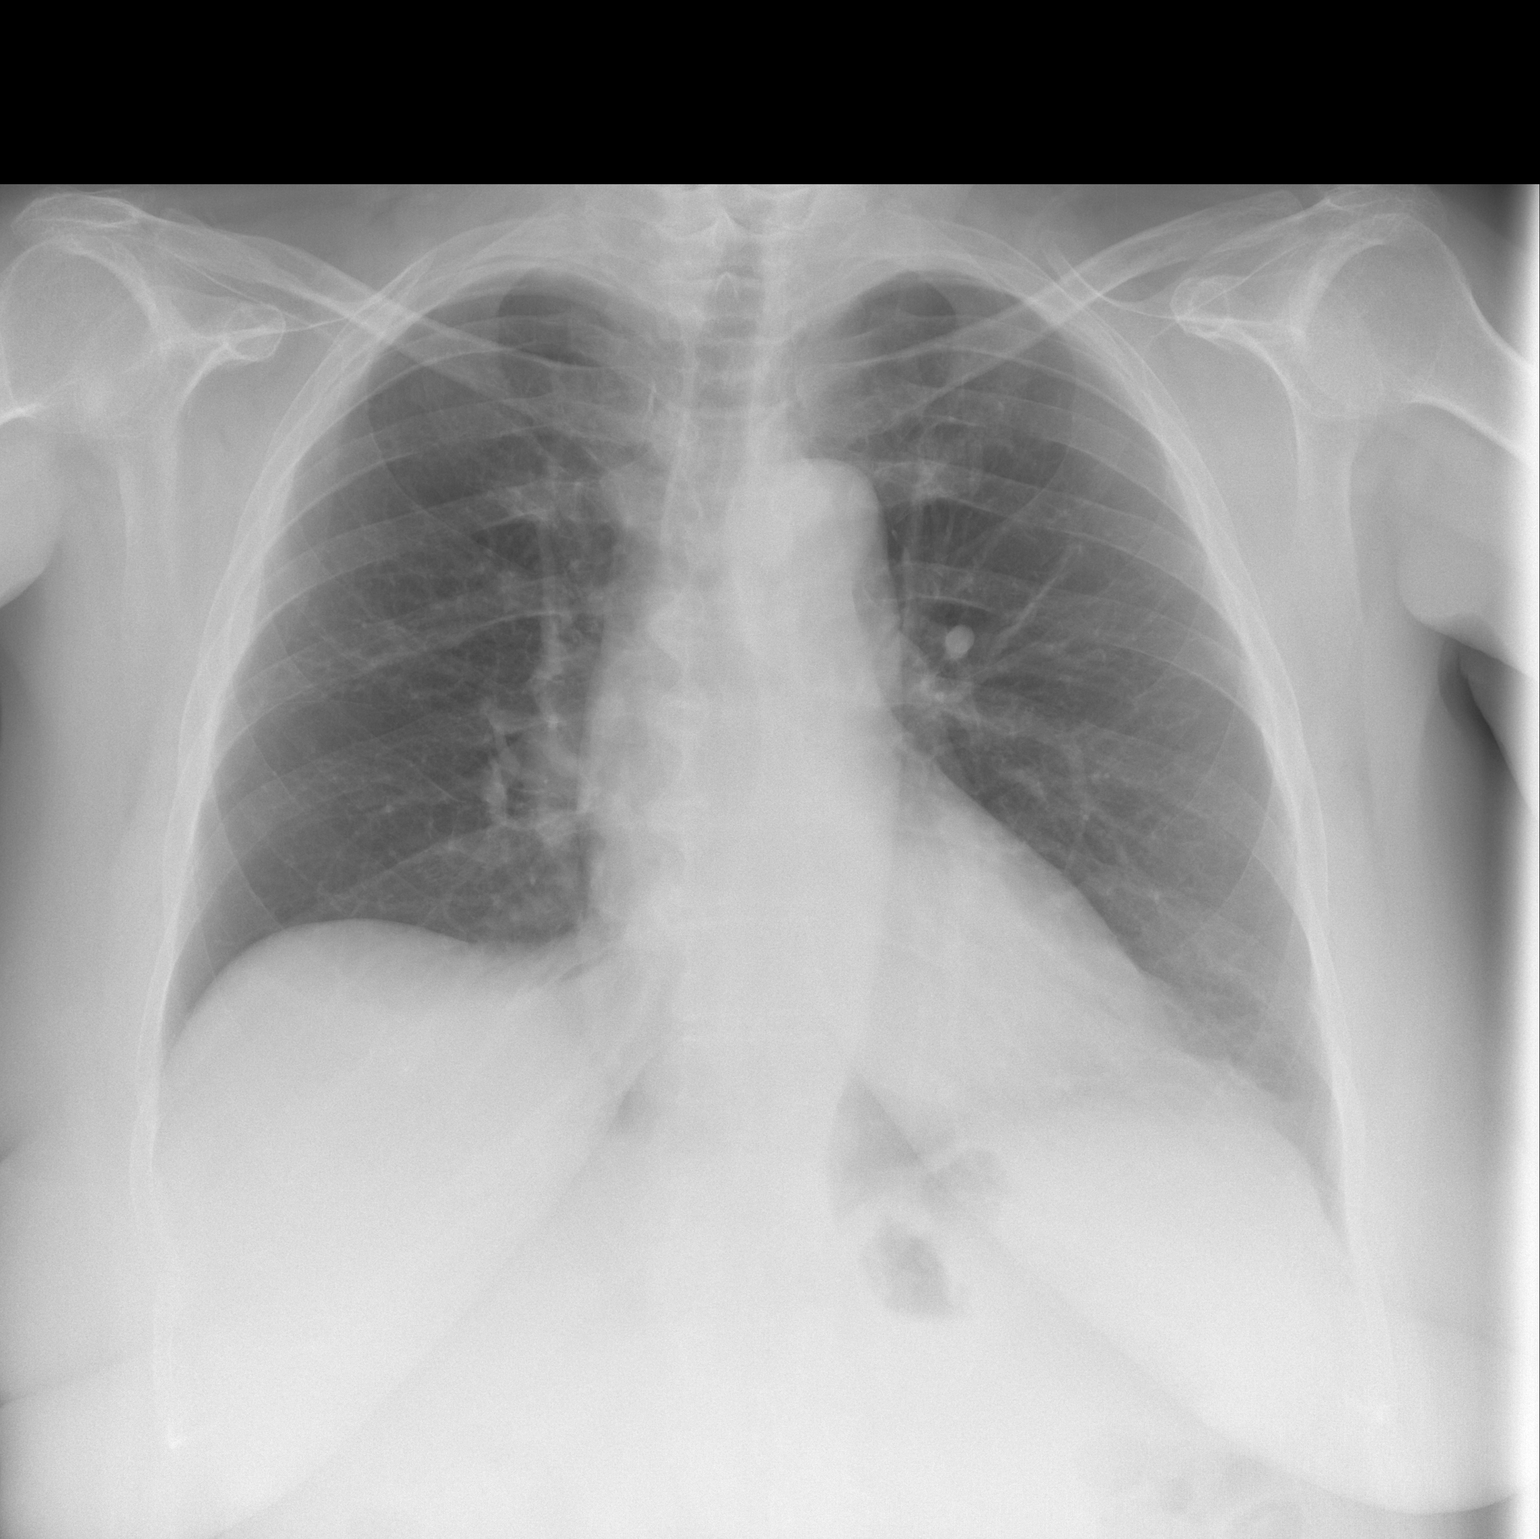

[w chest lat]
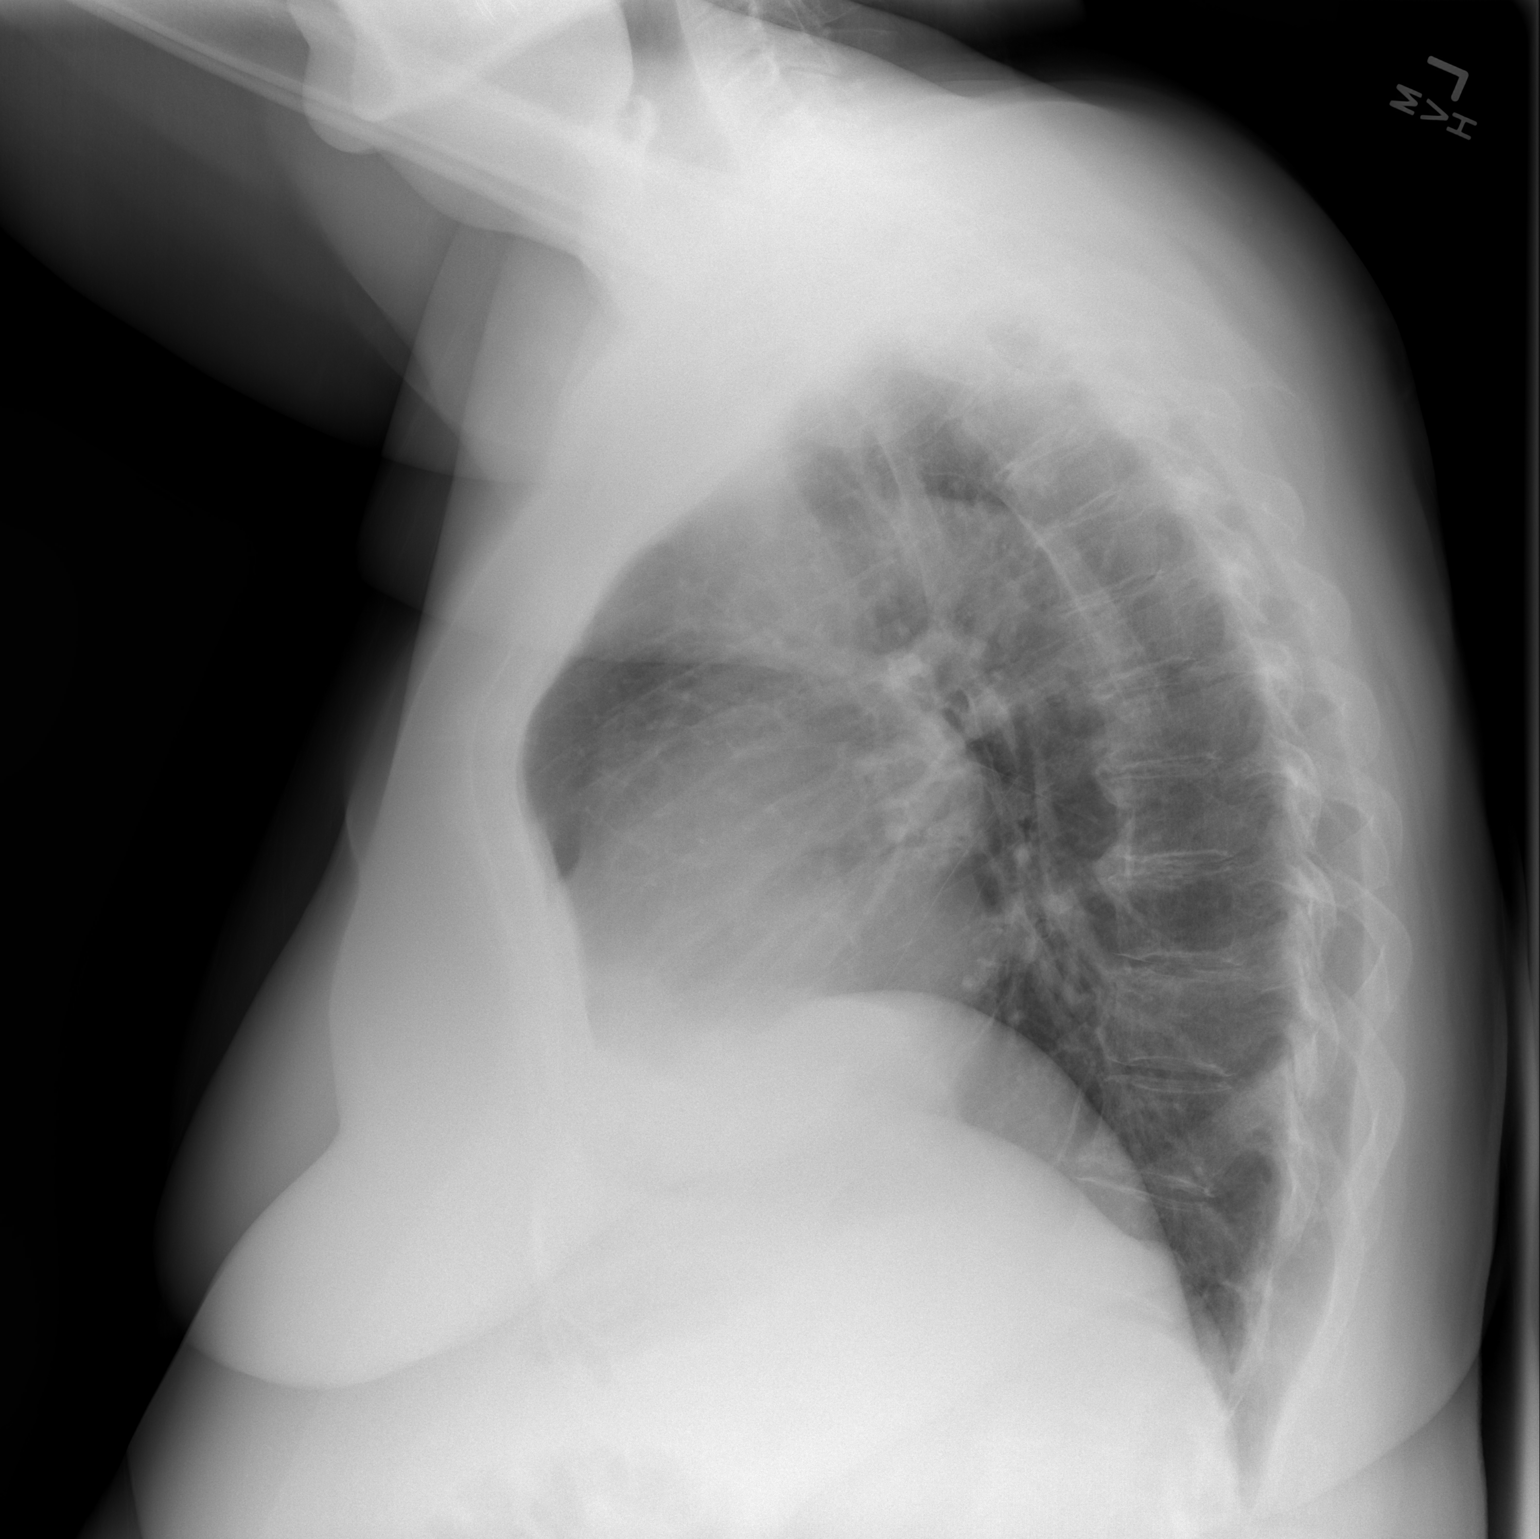

[2 of 2 positions shown; findings below may reference images not displayed]

FINDINGS: The heart size and mediastinal contours are within normal limits.
Both lungs are clear. The bony thorax is demineralized. There are
degenerative changes along the thoracic spine.
IMPRESSION: No active cardiopulmonary disease.

## 2014-07-10 ENCOUNTER — Ambulatory Visit: Payer: Medicare Other | Admitting: Hematology and Oncology

## 2014-07-10 ENCOUNTER — Other Ambulatory Visit: Payer: Medicare Other

## 2015-02-11 DIAGNOSIS — I10 Essential (primary) hypertension: Secondary | ICD-10-CM | POA: Diagnosis not present

## 2015-02-11 DIAGNOSIS — Z79899 Other long term (current) drug therapy: Secondary | ICD-10-CM | POA: Diagnosis not present

## 2015-02-11 DIAGNOSIS — L400 Psoriasis vulgaris: Secondary | ICD-10-CM | POA: Diagnosis not present

## 2015-02-11 DIAGNOSIS — M899 Disorder of bone, unspecified: Secondary | ICD-10-CM | POA: Diagnosis not present

## 2015-02-11 DIAGNOSIS — Z23 Encounter for immunization: Secondary | ICD-10-CM | POA: Diagnosis not present

## 2015-02-11 DIAGNOSIS — F0390 Unspecified dementia without behavioral disturbance: Secondary | ICD-10-CM | POA: Diagnosis not present

## 2015-02-11 DIAGNOSIS — E559 Vitamin D deficiency, unspecified: Secondary | ICD-10-CM | POA: Diagnosis not present

## 2015-02-11 DIAGNOSIS — E785 Hyperlipidemia, unspecified: Secondary | ICD-10-CM | POA: Diagnosis not present

## 2015-02-11 DIAGNOSIS — Z0000 Encounter for general adult medical examination without abnormal findings: Secondary | ICD-10-CM | POA: Diagnosis not present

## 2015-02-11 DIAGNOSIS — F324 Major depressive disorder, single episode, in partial remission: Secondary | ICD-10-CM | POA: Diagnosis not present

## 2015-03-12 DIAGNOSIS — L400 Psoriasis vulgaris: Secondary | ICD-10-CM | POA: Diagnosis not present

## 2015-11-12 DIAGNOSIS — L400 Psoriasis vulgaris: Secondary | ICD-10-CM | POA: Diagnosis not present

## 2016-02-15 DIAGNOSIS — F324 Major depressive disorder, single episode, in partial remission: Secondary | ICD-10-CM | POA: Diagnosis not present

## 2016-02-15 DIAGNOSIS — G309 Alzheimer's disease, unspecified: Secondary | ICD-10-CM | POA: Diagnosis not present

## 2016-02-15 DIAGNOSIS — F0390 Unspecified dementia without behavioral disturbance: Secondary | ICD-10-CM | POA: Diagnosis not present

## 2016-02-15 DIAGNOSIS — E785 Hyperlipidemia, unspecified: Secondary | ICD-10-CM | POA: Diagnosis not present

## 2016-02-15 DIAGNOSIS — Z79899 Other long term (current) drug therapy: Secondary | ICD-10-CM | POA: Diagnosis not present

## 2016-02-15 DIAGNOSIS — I10 Essential (primary) hypertension: Secondary | ICD-10-CM | POA: Diagnosis not present

## 2016-02-15 DIAGNOSIS — Z0000 Encounter for general adult medical examination without abnormal findings: Secondary | ICD-10-CM | POA: Diagnosis not present

## 2016-02-15 DIAGNOSIS — J439 Emphysema, unspecified: Secondary | ICD-10-CM | POA: Diagnosis not present

## 2016-02-15 DIAGNOSIS — L400 Psoriasis vulgaris: Secondary | ICD-10-CM | POA: Diagnosis not present

## 2016-02-15 DIAGNOSIS — M47814 Spondylosis without myelopathy or radiculopathy, thoracic region: Secondary | ICD-10-CM | POA: Diagnosis not present

## 2016-02-15 DIAGNOSIS — E559 Vitamin D deficiency, unspecified: Secondary | ICD-10-CM | POA: Diagnosis not present

## 2016-02-15 DIAGNOSIS — D72829 Elevated white blood cell count, unspecified: Secondary | ICD-10-CM | POA: Diagnosis not present

## 2016-02-24 DIAGNOSIS — D72829 Elevated white blood cell count, unspecified: Secondary | ICD-10-CM | POA: Diagnosis not present

## 2016-04-23 ENCOUNTER — Encounter (HOSPITAL_COMMUNITY): Payer: Self-pay | Admitting: Emergency Medicine

## 2016-04-23 ENCOUNTER — Emergency Department (HOSPITAL_COMMUNITY): Payer: Medicare Other

## 2016-04-23 ENCOUNTER — Emergency Department (HOSPITAL_COMMUNITY)
Admission: EM | Admit: 2016-04-23 | Discharge: 2016-04-23 | Disposition: A | Discharge status: Home / Self Care | Payer: Medicare Other | Attending: Emergency Medicine | Admitting: Emergency Medicine

## 2016-04-23 DIAGNOSIS — S199XXA Unspecified injury of neck, initial encounter: Secondary | ICD-10-CM | POA: Diagnosis not present

## 2016-04-23 DIAGNOSIS — Z7982 Long term (current) use of aspirin: Secondary | ICD-10-CM | POA: Insufficient documentation

## 2016-04-23 DIAGNOSIS — Z79899 Other long term (current) drug therapy: Secondary | ICD-10-CM | POA: Diagnosis not present

## 2016-04-23 DIAGNOSIS — Z23 Encounter for immunization: Secondary | ICD-10-CM | POA: Diagnosis not present

## 2016-04-23 DIAGNOSIS — W1809XA Striking against other object with subsequent fall, initial encounter: Secondary | ICD-10-CM | POA: Diagnosis not present

## 2016-04-23 DIAGNOSIS — Y9389 Activity, other specified: Secondary | ICD-10-CM | POA: Insufficient documentation

## 2016-04-23 DIAGNOSIS — Y999 Unspecified external cause status: Secondary | ICD-10-CM | POA: Insufficient documentation

## 2016-04-23 DIAGNOSIS — S0101XA Laceration without foreign body of scalp, initial encounter: Secondary | ICD-10-CM | POA: Diagnosis not present

## 2016-04-23 DIAGNOSIS — W19XXXA Unspecified fall, initial encounter: Secondary | ICD-10-CM

## 2016-04-23 DIAGNOSIS — S0181XA Laceration without foreign body of other part of head, initial encounter: Secondary | ICD-10-CM | POA: Diagnosis not present

## 2016-04-23 DIAGNOSIS — Y92009 Unspecified place in unspecified non-institutional (private) residence as the place of occurrence of the external cause: Secondary | ICD-10-CM | POA: Diagnosis not present

## 2016-04-23 DIAGNOSIS — M25569 Pain in unspecified knee: Secondary | ICD-10-CM | POA: Diagnosis not present

## 2016-04-23 DIAGNOSIS — R9431 Abnormal electrocardiogram [ECG] [EKG]: Secondary | ICD-10-CM | POA: Diagnosis not present

## 2016-04-23 DIAGNOSIS — S0990XA Unspecified injury of head, initial encounter: Secondary | ICD-10-CM | POA: Diagnosis not present

## 2016-04-23 LAB — URINALYSIS, ROUTINE W REFLEX MICROSCOPIC
Bilirubin Urine: NEGATIVE
GLUCOSE, UA: NEGATIVE mg/dL
Ketones, ur: NEGATIVE mg/dL
Nitrite: NEGATIVE
PH: 6 (ref 5.0–8.0)
PROTEIN: NEGATIVE mg/dL
Specific Gravity, Urine: 1.019 (ref 1.005–1.030)

## 2016-04-23 LAB — CBC WITH DIFFERENTIAL/PLATELET
BASOS ABS: 0 10*3/uL (ref 0.0–0.1)
Basophils Relative: 0 %
Eosinophils Absolute: 0.1 10*3/uL (ref 0.0–0.7)
Eosinophils Relative: 1 %
HEMATOCRIT: 42.4 % (ref 36.0–46.0)
Hemoglobin: 13.6 g/dL (ref 12.0–15.0)
LYMPHS ABS: 2.3 10*3/uL (ref 0.7–4.0)
LYMPHS PCT: 12 %
MCH: 32 pg (ref 26.0–34.0)
MCHC: 32.1 g/dL (ref 30.0–36.0)
MCV: 99.8 fL (ref 78.0–100.0)
Monocytes Absolute: 0.8 10*3/uL (ref 0.1–1.0)
Monocytes Relative: 4 %
Neutro Abs: 16.3 10*3/uL — ABNORMAL HIGH (ref 1.7–7.7)
Neutrophils Relative %: 83 %
Platelets: 401 10*3/uL — ABNORMAL HIGH (ref 150–400)
RBC: 4.25 MIL/uL (ref 3.87–5.11)
RDW: 15.5 % (ref 11.5–15.5)
WBC: 19.5 10*3/uL — ABNORMAL HIGH (ref 4.0–10.5)

## 2016-04-23 LAB — I-STAT CHEM 8, ED
BUN: 10 mg/dL (ref 6–20)
CHLORIDE: 103 mmol/L (ref 101–111)
CREATININE: 0.8 mg/dL (ref 0.44–1.00)
Calcium, Ion: 1.16 mmol/L (ref 1.12–1.23)
GLUCOSE: 113 mg/dL — AB (ref 65–99)
HCT: 42 % (ref 36.0–46.0)
Hemoglobin: 14.3 g/dL (ref 12.0–15.0)
POTASSIUM: 4 mmol/L (ref 3.5–5.1)
Sodium: 143 mmol/L (ref 135–145)
TCO2: 27 mmol/L (ref 0–100)

## 2016-04-23 LAB — URINE MICROSCOPIC-ADD ON

## 2016-04-23 LAB — COMPREHENSIVE METABOLIC PANEL
ALK PHOS: 128 U/L — AB (ref 38–126)
ALT: 22 U/L (ref 14–54)
AST: 15 U/L (ref 15–41)
Albumin: 3.8 g/dL (ref 3.5–5.0)
Anion gap: 10 (ref 5–15)
BUN: 12 mg/dL (ref 6–20)
CALCIUM: 9.1 mg/dL (ref 8.9–10.3)
CO2: 25 mmol/L (ref 22–32)
CREATININE: 0.82 mg/dL (ref 0.44–1.00)
Chloride: 107 mmol/L (ref 101–111)
Glucose, Bld: 114 mg/dL — ABNORMAL HIGH (ref 65–99)
Potassium: 3.9 mmol/L (ref 3.5–5.1)
Sodium: 142 mmol/L (ref 135–145)
Total Bilirubin: 0.9 mg/dL (ref 0.3–1.2)
Total Protein: 6.8 g/dL (ref 6.5–8.1)

## 2016-04-23 LAB — CBG MONITORING, ED: Glucose-Capillary: 121 mg/dL — ABNORMAL HIGH (ref 65–99)

## 2016-04-23 LAB — I-STAT TROPONIN, ED: TROPONIN I, POC: 0.01 ng/mL (ref 0.00–0.08)

## 2016-04-23 MED ORDER — TETANUS-DIPHTH-ACELL PERTUSSIS 5-2.5-18.5 LF-MCG/0.5 IM SUSP
0.5000 mL | Freq: Once | INTRAMUSCULAR | Status: AC
  Administered 2016-04-23: 0.5 mL via INTRAMUSCULAR
  Filled 2016-04-23: qty 0.5

## 2016-04-23 MED ORDER — DEXTROSE 5 % IV SOLN
1.0000 g | Freq: Once | INTRAVENOUS | Status: AC
  Administered 2016-04-23: 1 g via INTRAVENOUS
  Filled 2016-04-23: qty 10

## 2016-04-23 MED ORDER — LIDOCAINE HCL (PF) 1 % IJ SOLN
10.0000 mL | Freq: Once | INTRAMUSCULAR | Status: AC
  Administered 2016-04-23: 10 mL via INTRADERMAL
  Filled 2016-04-23: qty 30

## 2016-04-23 NOTE — ED Provider Notes (Signed)
[facility] DEPT Provider Note   CSN: [id number] Arrival date & time: 04/23/16  0909  First Provider Contact:  None       History   Chief Complaint Chief Complaint  Patient presents with  . Fall  . Laceration    HPI Danielle Dixon is a 75 y.o. female with a past medical history of mild dementia who lives in assisted living facility. Patient presents after fall this morning. Patient states "I was standing up and then I just hit the floor." When asked if she passed out or lost consciousness. The patient says no. The patient denies mechanical fall. She also denies weakness. When I asked the patient to clarify, she again states "I just hit the floor." She denies racing or skipping heart, she is not on any blood thinners. She denies any recent volume loss, dizziness, muffled hearings or symptoms of presyncope. She denies shortness of breath or chest pain. The patient complains of some pain in her forehead where she suffered a laceration after hitting her head. She denies neurologic symptoms such as headache, changes in vision, vertigo, unilateral weakness, difficulty with speech. Patient is able to ambulate with assistance from stretcher to bed.  HPI  Past Medical History:  Diagnosis Date  . Dementia   . Dyspnea 06/25/2013  . Leukocytosis   . Leukocytosis, unspecified 06/25/2013  . Leukocytosis, unspecified 06/25/2013  . Psoriasis   . Unspecified vitamin D deficiency 06/25/2013    Patient Active Problem List   Diagnosis Date Noted  . Leukocytosis, unspecified 06/25/2013  . Dyspnea 06/25/2013  . Thrombocytosis ([place]) 06/25/2013  . Leukocytosis, unspecified 06/25/2013  . Need for prophylactic vaccination and inoculation against influenza 06/25/2013  . Unspecified vitamin D deficiency 06/25/2013    Past Surgical History:  Procedure Laterality Date  . APPENDECTOMY    . knee replacment Left   . TOE SURGERY      OB History    No data available       Home Medications      Prior to Admission medications   Medication Sig Start Date End Date Taking? Authorizing Provider  ALPRAZolam (XANAX) 0.25 MG tablet Take 0.25 mg by mouth 3 (three) times daily as needed for anxiety. Unsure of dose    Yes Historical Provider, MD  aspirin 81 MG tablet Take 81 mg by mouth daily.   Yes Historical Provider, MD  atenolol (TENORMIN) 25 MG tablet Take 25 mg by mouth daily.   Yes Historical Provider, MD  buPROPion (WELLBUTRIN XL) 300 MG 24 hr tablet Take 300 mg by mouth daily.   Yes Historical Provider, MD  cholecalciferol (VITAMIN D) 1000 UNITS tablet Take 1 tablet (1,000 Units total) by mouth daily. 06/25/13  Yes Heath Lark, MD  clobetasol cream (TEMOVATE) [id number] % Apply 1 application topically 2 (two) times daily as needed (ITCHING). To scalp    Yes Historical Provider, MD  donepezil (ARICEPT) 10 MG tablet Take 10 mg by mouth at bedtime. Unsure of dose    Yes Historical Provider, MD  folic acid (FOLVITE) 1 MG tablet Take 1 mg by mouth daily.   Yes Historical Provider, MD  methotrexate (RHEUMATREX) 2.5 MG tablet Take 15 mg by mouth every Wednesday.    Yes Historical Provider, MD  PARoxetine (PAXIL) 20 MG tablet Take 20 mg by mouth daily.   Yes Historical Provider, MD  simvastatin (ZOCOR) 10 MG tablet Take 10 mg by mouth at bedtime. Unsure of dose   Yes Historical Provider, MD  zafirlukast (ACCOLATE) 20  MG tablet Take 20 mg by mouth 2 (two) times daily.   Yes Historical Provider, MD    Family History Family History  Problem Relation Age of Onset  . Cancer Mother     breast cancer  . Cancer Father     lung    Social History Social History  Substance Use Topics  . Smoking status: Never Smoker  . Smokeless tobacco: Not on file  . Alcohol use No     Allergies   Review of patient's allergies indicates no known allergies.   Review of Systems Review of Systems  Ten systems reviewed and are negative for acute change, except as noted in the HPI.   Physical Exam Updated  Vital Signs BP 121/91 (BP Location: Left Arm)   Pulse 76   Temp 97.7 F (36.5 C)   Resp 20   SpO2 96%   Physical Exam  Physical Exam  Nursing note and vitals reviewed. Constitutional: She is oriented to person, place, and time. She appears well-developed and well-nourished. No distress.  HENT:  Head: Normocephalic.  4 cm jagged laceration of the left forehead. No bony tenderness around the eyes, no pain with eye movement, no nasal deformities. No hematomas of the scalp Eyes: Conjunctivae normal and EOM are normal. Pupils are equal, round, and reactive to light. No scleral icterus.  Neck: Normal range of motion.  Cardiovascular: Normal rate, regular rhythm and normal heart sounds.  Exam reveals no gallop and no friction rub.   No murmur heard. Pulmonary/Chest: Effort normal and breath sounds normal. No respiratory distress.  Abdominal: Soft. Bowel sounds are normal. She exhibits no distension and no mass. There is no tenderness. There is no guarding.  Neurological: She is alert and oriented to person, place, and time.  Skin: Skin is warm and dry. She is not diaphoretic.    ED Treatments / Results  Labs (all labs ordered are listed, but only abnormal results are displayed) Labs Reviewed  URINE CULTURE - Abnormal; Notable for the following:       Result Value   Culture MULTIPLE SPECIES PRESENT, SUGGEST RECOLLECTION (*)    All other components within normal limits  CBC WITH DIFFERENTIAL/PLATELET - Abnormal; Notable for the following:    WBC 19.5 (*)    Platelets 401 (*)    Neutro Abs 16.3 (*)    All other components within normal limits  COMPREHENSIVE METABOLIC PANEL - Abnormal; Notable for the following:    Glucose, Bld 114 (*)    Alkaline Phosphatase 128 (*)    All other components within normal limits  URINALYSIS, ROUTINE W REFLEX MICROSCOPIC (NOT AT [facility]) - Abnormal; Notable for the following:    APPearance CLOUDY (*)    Hgb urine dipstick LARGE (*)    Leukocytes, UA  MODERATE (*)    All other components within normal limits  URINE MICROSCOPIC-ADD ON - Abnormal; Notable for the following:    Squamous Epithelial / LPF 6-30 (*)    Bacteria, UA FEW (*)    Crystals CA OXALATE CRYSTALS (*)    All other components within normal limits  I-STAT CHEM 8, ED - Abnormal; Notable for the following:    Glucose, Bld 113 (*)    All other components within normal limits  CBG MONITORING, ED - Abnormal; Notable for the following:    Glucose-Capillary 121 (*)    All other components within normal limits  POCT CBG (FASTING - GLUCOSE)-MANUAL ENTRY  I-STAT TROPOININ, ED    EKG  EKG Interpretation None       Radiology Ct Head Wo Contrast  Result Date: 04/23/2016 CLINICAL DATA:  Fall.  Laceration. EXAM: CT HEAD WITHOUT CONTRAST CT CERVICAL SPINE WITHOUT CONTRAST TECHNIQUE: Multidetector CT imaging of the head and cervical spine was performed following the standard protocol without intravenous contrast. Multiplanar CT image reconstructions of the cervical spine were also generated. COMPARISON:  None. FINDINGS: CT HEAD FINDINGS Study mildly degraded by motion. The ventricles are normal in configuration. There is ventricular and sulcal enlargement reflecting mild atrophy. There are no parenchymal masses or mass effect. There is no evidence of an infarct. Patchy white matter hypoattenuation is noted consistent with mild chronic microvascular ischemic change. There are no extra-axial masses or abnormal fluid collections. There is no intracranial hemorrhage. The visualized sinuses and mastoid air cells are clear. No skull fracture. CT CERVICAL SPINE FINDINGS No fracture spondylolisthesis. Generalized straightening of the normal cervical lordosis. Moderate loss disc height at C2-C3, C5-C6 and C6-C7. Bones are demineralized. Soft tissues are unremarkable.  The lung apices are clear. IMPRESSION: HEAD CT: No acute intracranial abnormalities. Mild atrophy and chronic microvascular  ischemic change. CERVICAL CT:  No fracture or acute finding. Electronically Signed   By: Lajean Manes M.D.   On: 04/23/2016 10:32   Ct Cervical Spine Wo Contrast  Result Date: 04/23/2016 CLINICAL DATA:  Fall.  Laceration. EXAM: CT HEAD WITHOUT CONTRAST CT CERVICAL SPINE WITHOUT CONTRAST TECHNIQUE: Multidetector CT imaging of the head and cervical spine was performed following the standard protocol without intravenous contrast. Multiplanar CT image reconstructions of the cervical spine were also generated. COMPARISON:  None. FINDINGS: CT HEAD FINDINGS Study mildly degraded by motion. The ventricles are normal in configuration. There is ventricular and sulcal enlargement reflecting mild atrophy. There are no parenchymal masses or mass effect. There is no evidence of an infarct. Patchy white matter hypoattenuation is noted consistent with mild chronic microvascular ischemic change. There are no extra-axial masses or abnormal fluid collections. There is no intracranial hemorrhage. The visualized sinuses and mastoid air cells are clear. No skull fracture. CT CERVICAL SPINE FINDINGS No fracture spondylolisthesis. Generalized straightening of the normal cervical lordosis. Moderate loss disc height at C2-C3, C5-C6 and C6-C7. Bones are demineralized. Soft tissues are unremarkable.  The lung apices are clear. IMPRESSION: HEAD CT: No acute intracranial abnormalities. Mild atrophy and chronic microvascular ischemic change. CERVICAL CT:  No fracture or acute finding. Electronically Signed   By: Lajean Manes M.D.   On: 04/23/2016 10:32    Procedures .Marland KitchenLaceration Repair Date/Time: 04/24/2016 5:04 PM Performed by: Margarita Mail Authorized by: Margarita Mail   Consent:    Consent obtained:  Verbal   Risks discussed:  Infection and pain   Alternatives discussed:  No treatment Laceration details:    Location:  Face   Face location:  Forehead   Length (cm):  4   Depth (mm):  4 Repair type:    Repair type:   Simple Pre-procedure details:    Preparation:  Patient was prepped and draped in usual sterile fashion Exploration:    Hemostasis achieved with:  Direct pressure   Wound exploration: entire depth of wound probed and visualized     Contaminated: no   Treatment:    Area cleansed with:  Betadine   Amount of cleaning:  Standard   Irrigation solution:  Sterile saline Skin repair:    Repair method:  Sutures   Suture size:  5-0   Suture material:  Nylon   Suture technique:  Simple interrupted   Number of sutures:  3 Approximation:    Approximation:  Loose   Vermilion border: well-aligned   Post-procedure details:    Patient tolerance of procedure:  Tolerated well, no immediate complications   (including critical care time)  Medications Ordered in ED Medications  Tdap (BOOSTRIX) injection 0.5 mL (0.5 mLs Intramuscular Given 04/23/16 0943)  lidocaine (PF) (XYLOCAINE) 1 % injection 10 mL (10 mLs Intradermal Given 04/23/16 1221)  cefTRIAXone (ROCEPHIN) 1 g in dextrose 5 % 50 mL IVPB (0 g Intravenous Stopped 04/23/16 1250)     Initial Impression / Assessment and Plan / ED Course  I have reviewed the triage vital signs and the nursing notes.  Pertinent labs & imaging results that were available during my care of the patient were reviewed by me and considered in my medical decision making (see chart for details).  Clinical Course  Value Comment By Time  EKG 12-Lead (Reviewed) Lacretia Leigh, MD 08/12 1410  EKG 12-Lead (Reviewed) Lacretia Leigh, MD 08/12 1410  EKG 12-Lead (Reviewed) Lacretia Leigh, MD 08/12 1410  EKG 12-Lead (Reviewed) Lacretia Leigh, MD 08/12 1411  EKG 12-Lead (Reviewed) Lacretia Leigh, MD 08/12 1412    ED ECG REPORT   Date: 04/24/2016  Rate: 69  Rhythm: normal sinus rhythm  QRS Axis: normal  Intervals: normal  ST/T Wave abnormalities: nonspecific T wave changes  Conduction Disutrbances:none  Narrative Interpretation:   Old EKG Reviewed: unchanged  I have  personally reviewed the EKG tracing and agree with the computerized printout as noted.\Patient was asymptomatic urine. Urine culture sent. Will not treat at this point. EKG is unremarkable. Her workup is unremarkable regarding potential syncope. Patient does state that she did not syncopized. Laceration of the forehead, repaired. She is ambulatory here in the ED. She appears safe for discharge at this time.   Final Clinical Impressions(s) / ED Diagnoses   Final diagnoses:  Fall, initial encounter  Forehead laceration, initial encounter    New Prescriptions Discharge Medication List as of 04/23/2016  2:23 PM       Margarita Mail, PA-C 04/24/16 1706    Lacretia Leigh, MD 04/25/16 1651

## 2016-04-23 NOTE — ED Triage Notes (Signed)
Per EMS pt from [facility] with complaint laceration to forehead related to unwitnessed floor. No LOC or blood thinners.

## 2016-04-23 NOTE — ED Notes (Signed)
Bed: [id number] Expected date:  Expected time:  Means of arrival:  Comments: Fall, head lac

## 2016-04-23 NOTE — ED Provider Notes (Signed)
Medical screening examination/treatment/procedure(s) were conducted as a shared visit with non-physician practitioner(s) and myself.  I personally evaluated the patient during the encounter.   EKG Interpretation None     75 year old female here after a fall at home. States took a step and fell forward onto her face. No loss of consciousness. Patient had a for a laceration that was repaired. Head and neck CT without acute fracture. Patient neurological baseline and will be discharged home  ED ECG REPORT   Date: 04/23/2016  Rate: 69  Rhythm: normal sinus rhythm  QRS Axis: left  Intervals: normal  ST/T Wave abnormalities: nonspecific T wave changes  Conduction Disutrbances:none  Narrative Interpretation:   Old EKG Reviewed: none available  I have personally reviewed the EKG tracing and agree with the computerized printout as noted.    Lacretia Leigh, MD 04/23/16 1425

## 2016-04-23 NOTE — ED Notes (Signed)
At time of [facility] arrival pt sat self down [place] style on floor and stated to weak to leave. Pt assisted to bed and VS reassessed. Artelia Laroche charge nurse aware. Pt continued to be discharged.

## 2016-04-23 NOTE — ED Notes (Signed)
Sister [name] on phone ensure pt has transportation back to [facility]. Staff verbalized to sister Corey Harold will transport pt home. Sister continued to state pt "thinks she is staying the night because she is too tired." Staff verbalized to pt ride is on way and she is medically stable/okay to go home.

## 2016-04-24 LAB — URINE CULTURE

## 2016-05-05 DIAGNOSIS — Z4802 Encounter for removal of sutures: Secondary | ICD-10-CM | POA: Diagnosis not present

## 2016-05-05 DIAGNOSIS — W19XXXD Unspecified fall, subsequent encounter: Secondary | ICD-10-CM | POA: Diagnosis not present

## 2016-06-07 DIAGNOSIS — R42 Dizziness and giddiness: Secondary | ICD-10-CM | POA: Diagnosis not present

## 2016-06-07 DIAGNOSIS — F324 Major depressive disorder, single episode, in partial remission: Secondary | ICD-10-CM | POA: Diagnosis not present

## 2016-06-07 DIAGNOSIS — Z23 Encounter for immunization: Secondary | ICD-10-CM | POA: Diagnosis not present

## 2016-06-07 DIAGNOSIS — L400 Psoriasis vulgaris: Secondary | ICD-10-CM | POA: Diagnosis not present

## 2016-07-01 DIAGNOSIS — L400 Psoriasis vulgaris: Secondary | ICD-10-CM | POA: Diagnosis not present

## 2016-07-15 DIAGNOSIS — L400 Psoriasis vulgaris: Secondary | ICD-10-CM | POA: Diagnosis not present

## 2016-07-28 DIAGNOSIS — L400 Psoriasis vulgaris: Secondary | ICD-10-CM | POA: Diagnosis not present

## 2016-08-17 DIAGNOSIS — Z79899 Other long term (current) drug therapy: Secondary | ICD-10-CM | POA: Diagnosis not present

## 2016-08-17 DIAGNOSIS — L400 Psoriasis vulgaris: Secondary | ICD-10-CM | POA: Diagnosis not present

## 2016-09-22 DIAGNOSIS — Z79899 Other long term (current) drug therapy: Secondary | ICD-10-CM | POA: Diagnosis not present

## 2016-09-22 DIAGNOSIS — L400 Psoriasis vulgaris: Secondary | ICD-10-CM | POA: Diagnosis not present

## 2016-10-24 DIAGNOSIS — R319 Hematuria, unspecified: Secondary | ICD-10-CM | POA: Diagnosis not present

## 2016-10-24 DIAGNOSIS — N390 Urinary tract infection, site not specified: Secondary | ICD-10-CM | POA: Diagnosis not present

## 2016-12-15 DIAGNOSIS — F411 Generalized anxiety disorder: Secondary | ICD-10-CM | POA: Diagnosis not present

## 2016-12-15 DIAGNOSIS — F331 Major depressive disorder, recurrent, moderate: Secondary | ICD-10-CM | POA: Diagnosis not present

## 2016-12-15 DIAGNOSIS — F0281 Dementia in other diseases classified elsewhere with behavioral disturbance: Secondary | ICD-10-CM | POA: Diagnosis not present

## 2016-12-26 DIAGNOSIS — F324 Major depressive disorder, single episode, in partial remission: Secondary | ICD-10-CM | POA: Diagnosis not present

## 2016-12-26 DIAGNOSIS — L409 Psoriasis, unspecified: Secondary | ICD-10-CM | POA: Diagnosis not present

## 2016-12-26 DIAGNOSIS — L89101 Pressure ulcer of unspecified part of back, stage 1: Secondary | ICD-10-CM | POA: Diagnosis not present

## 2016-12-26 DIAGNOSIS — M47814 Spondylosis without myelopathy or radiculopathy, thoracic region: Secondary | ICD-10-CM | POA: Diagnosis not present

## 2016-12-29 DIAGNOSIS — F0280 Dementia in other diseases classified elsewhere without behavioral disturbance: Secondary | ICD-10-CM | POA: Diagnosis not present

## 2016-12-29 DIAGNOSIS — F329 Major depressive disorder, single episode, unspecified: Secondary | ICD-10-CM | POA: Diagnosis not present

## 2016-12-29 DIAGNOSIS — I10 Essential (primary) hypertension: Secondary | ICD-10-CM | POA: Diagnosis not present

## 2016-12-29 DIAGNOSIS — M8580 Other specified disorders of bone density and structure, unspecified site: Secondary | ICD-10-CM | POA: Diagnosis not present

## 2016-12-29 DIAGNOSIS — G309 Alzheimer's disease, unspecified: Secondary | ICD-10-CM | POA: Diagnosis not present

## 2016-12-29 DIAGNOSIS — L409 Psoriasis, unspecified: Secondary | ICD-10-CM | POA: Diagnosis not present

## 2017-01-02 DIAGNOSIS — G309 Alzheimer's disease, unspecified: Secondary | ICD-10-CM | POA: Diagnosis not present

## 2017-01-02 DIAGNOSIS — I10 Essential (primary) hypertension: Secondary | ICD-10-CM | POA: Diagnosis not present

## 2017-01-02 DIAGNOSIS — F0280 Dementia in other diseases classified elsewhere without behavioral disturbance: Secondary | ICD-10-CM | POA: Diagnosis not present

## 2017-01-02 DIAGNOSIS — L409 Psoriasis, unspecified: Secondary | ICD-10-CM | POA: Diagnosis not present

## 2017-01-02 DIAGNOSIS — F329 Major depressive disorder, single episode, unspecified: Secondary | ICD-10-CM | POA: Diagnosis not present

## 2017-01-02 DIAGNOSIS — M8580 Other specified disorders of bone density and structure, unspecified site: Secondary | ICD-10-CM | POA: Diagnosis not present

## 2017-01-09 DIAGNOSIS — F0280 Dementia in other diseases classified elsewhere without behavioral disturbance: Secondary | ICD-10-CM | POA: Diagnosis not present

## 2017-01-09 DIAGNOSIS — F329 Major depressive disorder, single episode, unspecified: Secondary | ICD-10-CM | POA: Diagnosis not present

## 2017-01-09 DIAGNOSIS — I10 Essential (primary) hypertension: Secondary | ICD-10-CM | POA: Diagnosis not present

## 2017-01-09 DIAGNOSIS — L409 Psoriasis, unspecified: Secondary | ICD-10-CM | POA: Diagnosis not present

## 2017-01-09 DIAGNOSIS — G309 Alzheimer's disease, unspecified: Secondary | ICD-10-CM | POA: Diagnosis not present

## 2017-01-09 DIAGNOSIS — M8580 Other specified disorders of bone density and structure, unspecified site: Secondary | ICD-10-CM | POA: Diagnosis not present

## 2017-01-16 DIAGNOSIS — F0280 Dementia in other diseases classified elsewhere without behavioral disturbance: Secondary | ICD-10-CM | POA: Diagnosis not present

## 2017-01-16 DIAGNOSIS — L409 Psoriasis, unspecified: Secondary | ICD-10-CM | POA: Diagnosis not present

## 2017-01-16 DIAGNOSIS — G309 Alzheimer's disease, unspecified: Secondary | ICD-10-CM | POA: Diagnosis not present

## 2017-01-16 DIAGNOSIS — M8580 Other specified disorders of bone density and structure, unspecified site: Secondary | ICD-10-CM | POA: Diagnosis not present

## 2017-01-16 DIAGNOSIS — F329 Major depressive disorder, single episode, unspecified: Secondary | ICD-10-CM | POA: Diagnosis not present

## 2017-01-16 DIAGNOSIS — I10 Essential (primary) hypertension: Secondary | ICD-10-CM | POA: Diagnosis not present

## 2017-01-18 DIAGNOSIS — L400 Psoriasis vulgaris: Secondary | ICD-10-CM | POA: Diagnosis not present

## 2017-01-18 DIAGNOSIS — Z79899 Other long term (current) drug therapy: Secondary | ICD-10-CM | POA: Diagnosis not present

## 2017-01-25 DIAGNOSIS — G309 Alzheimer's disease, unspecified: Secondary | ICD-10-CM | POA: Diagnosis not present

## 2017-01-25 DIAGNOSIS — L409 Psoriasis, unspecified: Secondary | ICD-10-CM | POA: Diagnosis not present

## 2017-01-25 DIAGNOSIS — M8580 Other specified disorders of bone density and structure, unspecified site: Secondary | ICD-10-CM | POA: Diagnosis not present

## 2017-01-25 DIAGNOSIS — F329 Major depressive disorder, single episode, unspecified: Secondary | ICD-10-CM | POA: Diagnosis not present

## 2017-01-25 DIAGNOSIS — I10 Essential (primary) hypertension: Secondary | ICD-10-CM | POA: Diagnosis not present

## 2017-01-25 DIAGNOSIS — F0280 Dementia in other diseases classified elsewhere without behavioral disturbance: Secondary | ICD-10-CM | POA: Diagnosis not present

## 2017-02-16 DIAGNOSIS — Z79899 Other long term (current) drug therapy: Secondary | ICD-10-CM | POA: Diagnosis not present

## 2017-02-16 DIAGNOSIS — E559 Vitamin D deficiency, unspecified: Secondary | ICD-10-CM | POA: Diagnosis not present

## 2017-02-16 DIAGNOSIS — I10 Essential (primary) hypertension: Secondary | ICD-10-CM | POA: Diagnosis not present

## 2017-02-16 DIAGNOSIS — Z0000 Encounter for general adult medical examination without abnormal findings: Secondary | ICD-10-CM | POA: Diagnosis not present

## 2017-02-16 DIAGNOSIS — K5909 Other constipation: Secondary | ICD-10-CM | POA: Diagnosis not present

## 2017-02-16 DIAGNOSIS — R32 Unspecified urinary incontinence: Secondary | ICD-10-CM | POA: Diagnosis not present

## 2017-02-16 DIAGNOSIS — E785 Hyperlipidemia, unspecified: Secondary | ICD-10-CM | POA: Diagnosis not present

## 2017-02-16 DIAGNOSIS — Z6836 Body mass index (BMI) 36.0-36.9, adult: Secondary | ICD-10-CM | POA: Diagnosis not present

## 2017-04-04 DIAGNOSIS — L0390 Cellulitis, unspecified: Secondary | ICD-10-CM | POA: Diagnosis not present

## 2017-04-15 DIAGNOSIS — F0280 Dementia in other diseases classified elsewhere without behavioral disturbance: Secondary | ICD-10-CM | POA: Diagnosis not present

## 2017-04-15 DIAGNOSIS — L89310 Pressure ulcer of right buttock, unstageable: Secondary | ICD-10-CM | POA: Diagnosis not present

## 2017-04-15 DIAGNOSIS — J439 Emphysema, unspecified: Secondary | ICD-10-CM | POA: Diagnosis not present

## 2017-04-15 DIAGNOSIS — L89320 Pressure ulcer of left buttock, unstageable: Secondary | ICD-10-CM | POA: Diagnosis not present

## 2017-04-15 DIAGNOSIS — L309 Dermatitis, unspecified: Secondary | ICD-10-CM | POA: Diagnosis not present

## 2017-04-15 DIAGNOSIS — G309 Alzheimer's disease, unspecified: Secondary | ICD-10-CM | POA: Diagnosis not present

## 2017-04-18 DIAGNOSIS — L89320 Pressure ulcer of left buttock, unstageable: Secondary | ICD-10-CM | POA: Diagnosis not present

## 2017-04-18 DIAGNOSIS — L89310 Pressure ulcer of right buttock, unstageable: Secondary | ICD-10-CM | POA: Diagnosis not present

## 2017-04-18 DIAGNOSIS — J439 Emphysema, unspecified: Secondary | ICD-10-CM | POA: Diagnosis not present

## 2017-04-18 DIAGNOSIS — G309 Alzheimer's disease, unspecified: Secondary | ICD-10-CM | POA: Diagnosis not present

## 2017-04-18 DIAGNOSIS — F0280 Dementia in other diseases classified elsewhere without behavioral disturbance: Secondary | ICD-10-CM | POA: Diagnosis not present

## 2017-04-18 DIAGNOSIS — L309 Dermatitis, unspecified: Secondary | ICD-10-CM | POA: Diagnosis not present

## 2017-04-20 DIAGNOSIS — G309 Alzheimer's disease, unspecified: Secondary | ICD-10-CM | POA: Diagnosis not present

## 2017-04-20 DIAGNOSIS — F0280 Dementia in other diseases classified elsewhere without behavioral disturbance: Secondary | ICD-10-CM | POA: Diagnosis not present

## 2017-04-20 DIAGNOSIS — L89320 Pressure ulcer of left buttock, unstageable: Secondary | ICD-10-CM | POA: Diagnosis not present

## 2017-04-20 DIAGNOSIS — J439 Emphysema, unspecified: Secondary | ICD-10-CM | POA: Diagnosis not present

## 2017-04-20 DIAGNOSIS — L309 Dermatitis, unspecified: Secondary | ICD-10-CM | POA: Diagnosis not present

## 2017-04-20 DIAGNOSIS — L89310 Pressure ulcer of right buttock, unstageable: Secondary | ICD-10-CM | POA: Diagnosis not present

## 2017-04-25 DIAGNOSIS — F0280 Dementia in other diseases classified elsewhere without behavioral disturbance: Secondary | ICD-10-CM | POA: Diagnosis not present

## 2017-04-25 DIAGNOSIS — L89310 Pressure ulcer of right buttock, unstageable: Secondary | ICD-10-CM | POA: Diagnosis not present

## 2017-04-25 DIAGNOSIS — J439 Emphysema, unspecified: Secondary | ICD-10-CM | POA: Diagnosis not present

## 2017-04-25 DIAGNOSIS — L89320 Pressure ulcer of left buttock, unstageable: Secondary | ICD-10-CM | POA: Diagnosis not present

## 2017-04-25 DIAGNOSIS — L309 Dermatitis, unspecified: Secondary | ICD-10-CM | POA: Diagnosis not present

## 2017-04-25 DIAGNOSIS — G309 Alzheimer's disease, unspecified: Secondary | ICD-10-CM | POA: Diagnosis not present

## 2017-04-27 DIAGNOSIS — G309 Alzheimer's disease, unspecified: Secondary | ICD-10-CM | POA: Diagnosis not present

## 2017-04-27 DIAGNOSIS — L89310 Pressure ulcer of right buttock, unstageable: Secondary | ICD-10-CM | POA: Diagnosis not present

## 2017-04-27 DIAGNOSIS — L89320 Pressure ulcer of left buttock, unstageable: Secondary | ICD-10-CM | POA: Diagnosis not present

## 2017-04-27 DIAGNOSIS — L309 Dermatitis, unspecified: Secondary | ICD-10-CM | POA: Diagnosis not present

## 2017-04-27 DIAGNOSIS — F0280 Dementia in other diseases classified elsewhere without behavioral disturbance: Secondary | ICD-10-CM | POA: Diagnosis not present

## 2017-04-27 DIAGNOSIS — J439 Emphysema, unspecified: Secondary | ICD-10-CM | POA: Diagnosis not present

## 2017-05-08 DIAGNOSIS — M0689 Other specified rheumatoid arthritis, multiple sites: Secondary | ICD-10-CM | POA: Diagnosis not present

## 2017-05-08 DIAGNOSIS — D729 Disorder of white blood cells, unspecified: Secondary | ICD-10-CM | POA: Diagnosis not present

## 2017-05-08 DIAGNOSIS — R269 Unspecified abnormalities of gait and mobility: Secondary | ICD-10-CM | POA: Diagnosis not present

## 2017-05-08 DIAGNOSIS — N39498 Other specified urinary incontinence: Secondary | ICD-10-CM | POA: Diagnosis not present

## 2017-05-08 DIAGNOSIS — E538 Deficiency of other specified B group vitamins: Secondary | ICD-10-CM | POA: Diagnosis not present

## 2017-05-09 DIAGNOSIS — Z79899 Other long term (current) drug therapy: Secondary | ICD-10-CM | POA: Diagnosis not present

## 2017-05-16 DIAGNOSIS — G300 Alzheimer's disease with early onset: Secondary | ICD-10-CM | POA: Diagnosis not present

## 2017-05-16 DIAGNOSIS — R269 Unspecified abnormalities of gait and mobility: Secondary | ICD-10-CM | POA: Diagnosis not present

## 2017-05-16 DIAGNOSIS — L259 Unspecified contact dermatitis, unspecified cause: Secondary | ICD-10-CM | POA: Diagnosis not present

## 2017-05-16 DIAGNOSIS — N39490 Overflow incontinence: Secondary | ICD-10-CM | POA: Diagnosis not present

## 2017-05-25 DIAGNOSIS — L400 Psoriasis vulgaris: Secondary | ICD-10-CM | POA: Diagnosis not present

## 2017-05-25 DIAGNOSIS — L0889 Other specified local infections of the skin and subcutaneous tissue: Secondary | ICD-10-CM | POA: Diagnosis not present

## 2017-05-25 DIAGNOSIS — Z79899 Other long term (current) drug therapy: Secondary | ICD-10-CM | POA: Diagnosis not present

## 2017-05-31 DIAGNOSIS — G300 Alzheimer's disease with early onset: Secondary | ICD-10-CM | POA: Diagnosis not present

## 2017-08-07 DIAGNOSIS — Z7982 Long term (current) use of aspirin: Secondary | ICD-10-CM | POA: Diagnosis not present

## 2017-08-07 DIAGNOSIS — F339 Major depressive disorder, recurrent, unspecified: Secondary | ICD-10-CM | POA: Diagnosis not present

## 2017-08-07 DIAGNOSIS — M0600 Rheumatoid arthritis without rheumatoid factor, unspecified site: Secondary | ICD-10-CM | POA: Diagnosis not present

## 2017-08-07 DIAGNOSIS — N39490 Overflow incontinence: Secondary | ICD-10-CM | POA: Diagnosis not present

## 2017-08-07 DIAGNOSIS — G300 Alzheimer's disease with early onset: Secondary | ICD-10-CM | POA: Diagnosis not present

## 2017-11-15 DIAGNOSIS — F418 Other specified anxiety disorders: Secondary | ICD-10-CM | POA: Diagnosis not present

## 2017-11-15 DIAGNOSIS — N39490 Overflow incontinence: Secondary | ICD-10-CM | POA: Diagnosis not present

## 2017-11-15 DIAGNOSIS — F3289 Other specified depressive episodes: Secondary | ICD-10-CM | POA: Diagnosis not present

## 2017-11-15 DIAGNOSIS — G308 Other Alzheimer's disease: Secondary | ICD-10-CM | POA: Diagnosis not present

## 2017-11-15 DIAGNOSIS — Z79899 Other long term (current) drug therapy: Secondary | ICD-10-CM | POA: Diagnosis not present

## 2017-11-15 DIAGNOSIS — Z7982 Long term (current) use of aspirin: Secondary | ICD-10-CM | POA: Diagnosis not present

## 2017-12-18 DIAGNOSIS — F0391 Unspecified dementia with behavioral disturbance: Secondary | ICD-10-CM | POA: Diagnosis not present

## 2017-12-18 DIAGNOSIS — L408 Other psoriasis: Secondary | ICD-10-CM | POA: Diagnosis not present

## 2017-12-18 DIAGNOSIS — N39490 Overflow incontinence: Secondary | ICD-10-CM | POA: Diagnosis not present

## 2017-12-18 DIAGNOSIS — M06011 Rheumatoid arthritis without rheumatoid factor, right shoulder: Secondary | ICD-10-CM | POA: Diagnosis not present

## 2017-12-18 DIAGNOSIS — I10 Essential (primary) hypertension: Secondary | ICD-10-CM | POA: Diagnosis not present

## 2017-12-18 DIAGNOSIS — F349 Persistent mood [affective] disorder, unspecified: Secondary | ICD-10-CM | POA: Diagnosis not present

## 2017-12-18 DIAGNOSIS — R460 Very low level of personal hygiene: Secondary | ICD-10-CM | POA: Diagnosis not present

## 2017-12-26 DIAGNOSIS — N390 Urinary tract infection, site not specified: Secondary | ICD-10-CM | POA: Diagnosis not present

## 2017-12-26 DIAGNOSIS — G301 Alzheimer's disease with late onset: Secondary | ICD-10-CM | POA: Diagnosis not present

## 2017-12-26 DIAGNOSIS — M069 Rheumatoid arthritis, unspecified: Secondary | ICD-10-CM | POA: Diagnosis not present

## 2017-12-26 DIAGNOSIS — R451 Restlessness and agitation: Secondary | ICD-10-CM | POA: Diagnosis not present

## 2017-12-26 DIAGNOSIS — I10 Essential (primary) hypertension: Secondary | ICD-10-CM | POA: Diagnosis not present

## 2017-12-26 DIAGNOSIS — Z789 Other specified health status: Secondary | ICD-10-CM | POA: Diagnosis not present

## 2017-12-26 DIAGNOSIS — E7800 Pure hypercholesterolemia, unspecified: Secondary | ICD-10-CM | POA: Diagnosis not present

## 2018-01-02 DIAGNOSIS — G301 Alzheimer's disease with late onset: Secondary | ICD-10-CM | POA: Diagnosis not present

## 2018-01-02 DIAGNOSIS — I10 Essential (primary) hypertension: Secondary | ICD-10-CM | POA: Diagnosis not present

## 2018-01-02 DIAGNOSIS — N390 Urinary tract infection, site not specified: Secondary | ICD-10-CM | POA: Diagnosis not present

## 2018-01-05 DIAGNOSIS — Z79899 Other long term (current) drug therapy: Secondary | ICD-10-CM | POA: Diagnosis not present

## 2018-01-05 DIAGNOSIS — N390 Urinary tract infection, site not specified: Secondary | ICD-10-CM | POA: Diagnosis not present

## 2018-01-17 DIAGNOSIS — F0281 Dementia in other diseases classified elsewhere with behavioral disturbance: Secondary | ICD-10-CM | POA: Diagnosis not present

## 2018-01-17 DIAGNOSIS — G309 Alzheimer's disease, unspecified: Secondary | ICD-10-CM | POA: Diagnosis not present

## 2018-01-17 DIAGNOSIS — M069 Rheumatoid arthritis, unspecified: Secondary | ICD-10-CM | POA: Diagnosis not present

## 2018-01-17 DIAGNOSIS — I10 Essential (primary) hypertension: Secondary | ICD-10-CM | POA: Diagnosis not present

## 2018-01-18 DIAGNOSIS — G309 Alzheimer's disease, unspecified: Secondary | ICD-10-CM | POA: Diagnosis not present

## 2018-01-18 DIAGNOSIS — F0281 Dementia in other diseases classified elsewhere with behavioral disturbance: Secondary | ICD-10-CM | POA: Diagnosis not present

## 2018-01-23 DIAGNOSIS — G309 Alzheimer's disease, unspecified: Secondary | ICD-10-CM | POA: Diagnosis not present

## 2018-01-23 DIAGNOSIS — F0281 Dementia in other diseases classified elsewhere with behavioral disturbance: Secondary | ICD-10-CM | POA: Diagnosis not present

## 2018-01-24 DIAGNOSIS — G309 Alzheimer's disease, unspecified: Secondary | ICD-10-CM | POA: Diagnosis not present

## 2018-01-24 DIAGNOSIS — F0281 Dementia in other diseases classified elsewhere with behavioral disturbance: Secondary | ICD-10-CM | POA: Diagnosis not present

## 2018-01-26 DIAGNOSIS — F0281 Dementia in other diseases classified elsewhere with behavioral disturbance: Secondary | ICD-10-CM | POA: Diagnosis not present

## 2018-01-26 DIAGNOSIS — G309 Alzheimer's disease, unspecified: Secondary | ICD-10-CM | POA: Diagnosis not present

## 2018-02-01 DIAGNOSIS — F0281 Dementia in other diseases classified elsewhere with behavioral disturbance: Secondary | ICD-10-CM | POA: Diagnosis not present

## 2018-02-01 DIAGNOSIS — G309 Alzheimer's disease, unspecified: Secondary | ICD-10-CM | POA: Diagnosis not present

## 2018-02-06 DIAGNOSIS — F0281 Dementia in other diseases classified elsewhere with behavioral disturbance: Secondary | ICD-10-CM | POA: Diagnosis not present

## 2018-02-06 DIAGNOSIS — G309 Alzheimer's disease, unspecified: Secondary | ICD-10-CM | POA: Diagnosis not present

## 2018-02-08 DIAGNOSIS — G309 Alzheimer's disease, unspecified: Secondary | ICD-10-CM | POA: Diagnosis not present

## 2018-02-08 DIAGNOSIS — F0281 Dementia in other diseases classified elsewhere with behavioral disturbance: Secondary | ICD-10-CM | POA: Diagnosis not present

## 2018-02-13 DIAGNOSIS — F0281 Dementia in other diseases classified elsewhere with behavioral disturbance: Secondary | ICD-10-CM | POA: Diagnosis not present

## 2018-02-13 DIAGNOSIS — G309 Alzheimer's disease, unspecified: Secondary | ICD-10-CM | POA: Diagnosis not present

## 2018-02-15 DIAGNOSIS — M069 Rheumatoid arthritis, unspecified: Secondary | ICD-10-CM | POA: Diagnosis not present

## 2018-02-15 DIAGNOSIS — E7800 Pure hypercholesterolemia, unspecified: Secondary | ICD-10-CM | POA: Diagnosis not present

## 2018-02-15 DIAGNOSIS — G309 Alzheimer's disease, unspecified: Secondary | ICD-10-CM | POA: Diagnosis not present

## 2018-02-15 DIAGNOSIS — I10 Essential (primary) hypertension: Secondary | ICD-10-CM | POA: Diagnosis not present

## 2018-02-15 DIAGNOSIS — I739 Peripheral vascular disease, unspecified: Secondary | ICD-10-CM | POA: Diagnosis not present

## 2018-02-15 DIAGNOSIS — B351 Tinea unguium: Secondary | ICD-10-CM | POA: Diagnosis not present

## 2018-02-15 DIAGNOSIS — F0281 Dementia in other diseases classified elsewhere with behavioral disturbance: Secondary | ICD-10-CM | POA: Diagnosis not present

## 2018-02-15 DIAGNOSIS — G301 Alzheimer's disease with late onset: Secondary | ICD-10-CM | POA: Diagnosis not present

## 2018-02-20 DIAGNOSIS — J811 Chronic pulmonary edema: Secondary | ICD-10-CM | POA: Diagnosis not present

## 2018-02-20 DIAGNOSIS — F0281 Dementia in other diseases classified elsewhere with behavioral disturbance: Secondary | ICD-10-CM | POA: Diagnosis not present

## 2018-02-20 DIAGNOSIS — G309 Alzheimer's disease, unspecified: Secondary | ICD-10-CM | POA: Diagnosis not present

## 2018-02-21 DIAGNOSIS — B351 Tinea unguium: Secondary | ICD-10-CM | POA: Diagnosis not present

## 2018-02-21 DIAGNOSIS — G301 Alzheimer's disease with late onset: Secondary | ICD-10-CM | POA: Diagnosis not present

## 2018-02-21 DIAGNOSIS — I10 Essential (primary) hypertension: Secondary | ICD-10-CM | POA: Diagnosis not present

## 2018-02-21 DIAGNOSIS — J069 Acute upper respiratory infection, unspecified: Secondary | ICD-10-CM | POA: Diagnosis not present

## 2018-02-21 DIAGNOSIS — I739 Peripheral vascular disease, unspecified: Secondary | ICD-10-CM | POA: Diagnosis not present

## 2018-02-21 DIAGNOSIS — J45901 Unspecified asthma with (acute) exacerbation: Secondary | ICD-10-CM | POA: Diagnosis not present

## 2018-02-24 DIAGNOSIS — G309 Alzheimer's disease, unspecified: Secondary | ICD-10-CM | POA: Diagnosis not present

## 2018-02-24 DIAGNOSIS — F0281 Dementia in other diseases classified elsewhere with behavioral disturbance: Secondary | ICD-10-CM | POA: Diagnosis not present

## 2018-02-27 DIAGNOSIS — F0281 Dementia in other diseases classified elsewhere with behavioral disturbance: Secondary | ICD-10-CM | POA: Diagnosis not present

## 2018-02-27 DIAGNOSIS — G309 Alzheimer's disease, unspecified: Secondary | ICD-10-CM | POA: Diagnosis not present

## 2018-02-28 DIAGNOSIS — K5900 Constipation, unspecified: Secondary | ICD-10-CM | POA: Diagnosis not present

## 2018-02-28 DIAGNOSIS — J441 Chronic obstructive pulmonary disease with (acute) exacerbation: Secondary | ICD-10-CM | POA: Diagnosis not present

## 2018-02-28 DIAGNOSIS — I10 Essential (primary) hypertension: Secondary | ICD-10-CM | POA: Diagnosis not present

## 2018-02-28 DIAGNOSIS — J069 Acute upper respiratory infection, unspecified: Secondary | ICD-10-CM | POA: Diagnosis not present

## 2018-02-28 DIAGNOSIS — G301 Alzheimer's disease with late onset: Secondary | ICD-10-CM | POA: Diagnosis not present

## 2018-02-28 DIAGNOSIS — I739 Peripheral vascular disease, unspecified: Secondary | ICD-10-CM | POA: Diagnosis not present

## 2018-03-01 DIAGNOSIS — G309 Alzheimer's disease, unspecified: Secondary | ICD-10-CM | POA: Diagnosis not present

## 2018-03-01 DIAGNOSIS — F0281 Dementia in other diseases classified elsewhere with behavioral disturbance: Secondary | ICD-10-CM | POA: Diagnosis not present

## 2018-03-06 DIAGNOSIS — I10 Essential (primary) hypertension: Secondary | ICD-10-CM | POA: Diagnosis not present

## 2018-03-06 DIAGNOSIS — J441 Chronic obstructive pulmonary disease with (acute) exacerbation: Secondary | ICD-10-CM | POA: Diagnosis not present

## 2018-03-06 DIAGNOSIS — G309 Alzheimer's disease, unspecified: Secondary | ICD-10-CM | POA: Diagnosis not present

## 2018-03-06 DIAGNOSIS — F0281 Dementia in other diseases classified elsewhere with behavioral disturbance: Secondary | ICD-10-CM | POA: Diagnosis not present

## 2018-03-06 DIAGNOSIS — M069 Rheumatoid arthritis, unspecified: Secondary | ICD-10-CM | POA: Diagnosis not present

## 2018-03-06 DIAGNOSIS — G301 Alzheimer's disease with late onset: Secondary | ICD-10-CM | POA: Diagnosis not present

## 2018-03-06 DIAGNOSIS — B351 Tinea unguium: Secondary | ICD-10-CM | POA: Diagnosis not present

## 2018-03-06 DIAGNOSIS — I739 Peripheral vascular disease, unspecified: Secondary | ICD-10-CM | POA: Diagnosis not present

## 2018-03-08 DIAGNOSIS — G309 Alzheimer's disease, unspecified: Secondary | ICD-10-CM | POA: Diagnosis not present

## 2018-03-08 DIAGNOSIS — F0281 Dementia in other diseases classified elsewhere with behavioral disturbance: Secondary | ICD-10-CM | POA: Diagnosis not present

## 2018-03-12 DIAGNOSIS — F0281 Dementia in other diseases classified elsewhere with behavioral disturbance: Secondary | ICD-10-CM | POA: Diagnosis not present

## 2018-03-12 DIAGNOSIS — G309 Alzheimer's disease, unspecified: Secondary | ICD-10-CM | POA: Diagnosis not present

## 2018-03-13 DIAGNOSIS — G309 Alzheimer's disease, unspecified: Secondary | ICD-10-CM | POA: Diagnosis not present

## 2018-03-13 DIAGNOSIS — F0281 Dementia in other diseases classified elsewhere with behavioral disturbance: Secondary | ICD-10-CM | POA: Diagnosis not present

## 2018-03-14 DIAGNOSIS — J449 Chronic obstructive pulmonary disease, unspecified: Secondary | ICD-10-CM | POA: Diagnosis not present

## 2018-03-14 DIAGNOSIS — I10 Essential (primary) hypertension: Secondary | ICD-10-CM | POA: Diagnosis not present

## 2018-03-14 DIAGNOSIS — I739 Peripheral vascular disease, unspecified: Secondary | ICD-10-CM | POA: Diagnosis not present

## 2018-03-14 DIAGNOSIS — K5900 Constipation, unspecified: Secondary | ICD-10-CM | POA: Diagnosis not present

## 2018-03-14 DIAGNOSIS — G301 Alzheimer's disease with late onset: Secondary | ICD-10-CM | POA: Diagnosis not present

## 2018-03-16 DIAGNOSIS — F0281 Dementia in other diseases classified elsewhere with behavioral disturbance: Secondary | ICD-10-CM | POA: Diagnosis not present

## 2018-03-16 DIAGNOSIS — G309 Alzheimer's disease, unspecified: Secondary | ICD-10-CM | POA: Diagnosis not present

## 2018-03-20 DIAGNOSIS — F4320 Adjustment disorder, unspecified: Secondary | ICD-10-CM | POA: Diagnosis not present

## 2018-03-20 DIAGNOSIS — F39 Unspecified mood [affective] disorder: Secondary | ICD-10-CM | POA: Diagnosis not present

## 2018-03-20 DIAGNOSIS — F0390 Unspecified dementia without behavioral disturbance: Secondary | ICD-10-CM | POA: Diagnosis not present

## 2018-03-27 DIAGNOSIS — D649 Anemia, unspecified: Secondary | ICD-10-CM | POA: Diagnosis not present

## 2018-03-27 DIAGNOSIS — Z79899 Other long term (current) drug therapy: Secondary | ICD-10-CM | POA: Diagnosis not present

## 2018-03-27 DIAGNOSIS — E559 Vitamin D deficiency, unspecified: Secondary | ICD-10-CM | POA: Diagnosis not present

## 2018-03-28 DIAGNOSIS — J449 Chronic obstructive pulmonary disease, unspecified: Secondary | ICD-10-CM | POA: Diagnosis not present

## 2018-03-28 DIAGNOSIS — R05 Cough: Secondary | ICD-10-CM | POA: Diagnosis not present

## 2018-03-28 DIAGNOSIS — M069 Rheumatoid arthritis, unspecified: Secondary | ICD-10-CM | POA: Diagnosis not present

## 2018-03-28 DIAGNOSIS — K5900 Constipation, unspecified: Secondary | ICD-10-CM | POA: Diagnosis not present

## 2018-03-28 DIAGNOSIS — F419 Anxiety disorder, unspecified: Secondary | ICD-10-CM | POA: Diagnosis not present

## 2018-03-28 DIAGNOSIS — B351 Tinea unguium: Secondary | ICD-10-CM | POA: Diagnosis not present

## 2018-03-28 DIAGNOSIS — G301 Alzheimer's disease with late onset: Secondary | ICD-10-CM | POA: Diagnosis not present

## 2018-03-28 DIAGNOSIS — I10 Essential (primary) hypertension: Secondary | ICD-10-CM | POA: Diagnosis not present

## 2018-03-29 DIAGNOSIS — F39 Unspecified mood [affective] disorder: Secondary | ICD-10-CM | POA: Diagnosis not present

## 2018-03-29 DIAGNOSIS — F4320 Adjustment disorder, unspecified: Secondary | ICD-10-CM | POA: Diagnosis not present

## 2018-03-29 DIAGNOSIS — F0390 Unspecified dementia without behavioral disturbance: Secondary | ICD-10-CM | POA: Diagnosis not present

## 2018-04-11 DIAGNOSIS — Z87440 Personal history of urinary (tract) infections: Secondary | ICD-10-CM | POA: Diagnosis not present

## 2018-04-11 DIAGNOSIS — I10 Essential (primary) hypertension: Secondary | ICD-10-CM | POA: Diagnosis not present

## 2018-04-11 DIAGNOSIS — M21611 Bunion of right foot: Secondary | ICD-10-CM | POA: Diagnosis not present

## 2018-04-11 DIAGNOSIS — F419 Anxiety disorder, unspecified: Secondary | ICD-10-CM | POA: Diagnosis not present

## 2018-04-11 DIAGNOSIS — F3289 Other specified depressive episodes: Secondary | ICD-10-CM | POA: Diagnosis not present

## 2018-04-11 DIAGNOSIS — D519 Vitamin B12 deficiency anemia, unspecified: Secondary | ICD-10-CM | POA: Diagnosis not present

## 2018-04-11 DIAGNOSIS — M069 Rheumatoid arthritis, unspecified: Secondary | ICD-10-CM | POA: Diagnosis not present

## 2018-04-11 DIAGNOSIS — G301 Alzheimer's disease with late onset: Secondary | ICD-10-CM | POA: Diagnosis not present

## 2018-04-11 DIAGNOSIS — I739 Peripheral vascular disease, unspecified: Secondary | ICD-10-CM | POA: Diagnosis not present

## 2018-04-11 DIAGNOSIS — K5900 Constipation, unspecified: Secondary | ICD-10-CM | POA: Diagnosis not present

## 2018-04-11 DIAGNOSIS — J449 Chronic obstructive pulmonary disease, unspecified: Secondary | ICD-10-CM | POA: Diagnosis not present

## 2018-04-11 DIAGNOSIS — B351 Tinea unguium: Secondary | ICD-10-CM | POA: Diagnosis not present

## 2018-04-17 DIAGNOSIS — F0390 Unspecified dementia without behavioral disturbance: Secondary | ICD-10-CM | POA: Diagnosis not present

## 2018-04-17 DIAGNOSIS — F4320 Adjustment disorder, unspecified: Secondary | ICD-10-CM | POA: Diagnosis not present

## 2018-04-17 DIAGNOSIS — F39 Unspecified mood [affective] disorder: Secondary | ICD-10-CM | POA: Diagnosis not present

## 2018-05-15 DIAGNOSIS — H40033 Anatomical narrow angle, bilateral: Secondary | ICD-10-CM | POA: Diagnosis not present

## 2018-05-15 DIAGNOSIS — H25813 Combined forms of age-related cataract, bilateral: Secondary | ICD-10-CM | POA: Diagnosis not present

## 2018-05-17 DIAGNOSIS — F4320 Adjustment disorder, unspecified: Secondary | ICD-10-CM | POA: Diagnosis not present

## 2018-05-17 DIAGNOSIS — F0390 Unspecified dementia without behavioral disturbance: Secondary | ICD-10-CM | POA: Diagnosis not present

## 2018-05-17 DIAGNOSIS — F39 Unspecified mood [affective] disorder: Secondary | ICD-10-CM | POA: Diagnosis not present

## 2018-06-06 DIAGNOSIS — Z01818 Encounter for other preprocedural examination: Secondary | ICD-10-CM | POA: Diagnosis not present

## 2018-06-06 DIAGNOSIS — H25811 Combined forms of age-related cataract, right eye: Secondary | ICD-10-CM | POA: Diagnosis not present

## 2018-06-19 DIAGNOSIS — F0390 Unspecified dementia without behavioral disturbance: Secondary | ICD-10-CM | POA: Diagnosis not present

## 2018-06-19 DIAGNOSIS — F4320 Adjustment disorder, unspecified: Secondary | ICD-10-CM | POA: Diagnosis not present

## 2018-06-19 DIAGNOSIS — F39 Unspecified mood [affective] disorder: Secondary | ICD-10-CM | POA: Diagnosis not present

## 2018-06-21 DIAGNOSIS — G301 Alzheimer's disease with late onset: Secondary | ICD-10-CM | POA: Diagnosis not present

## 2018-06-21 DIAGNOSIS — D519 Vitamin B12 deficiency anemia, unspecified: Secondary | ICD-10-CM | POA: Diagnosis not present

## 2018-06-21 DIAGNOSIS — K5900 Constipation, unspecified: Secondary | ICD-10-CM | POA: Diagnosis not present

## 2018-06-21 DIAGNOSIS — I10 Essential (primary) hypertension: Secondary | ICD-10-CM | POA: Diagnosis not present

## 2018-06-21 DIAGNOSIS — M21611 Bunion of right foot: Secondary | ICD-10-CM | POA: Diagnosis not present

## 2018-06-21 DIAGNOSIS — J449 Chronic obstructive pulmonary disease, unspecified: Secondary | ICD-10-CM | POA: Diagnosis not present

## 2018-06-21 DIAGNOSIS — F3289 Other specified depressive episodes: Secondary | ICD-10-CM | POA: Diagnosis not present

## 2018-06-21 DIAGNOSIS — B351 Tinea unguium: Secondary | ICD-10-CM | POA: Diagnosis not present

## 2018-06-21 DIAGNOSIS — M069 Rheumatoid arthritis, unspecified: Secondary | ICD-10-CM | POA: Diagnosis not present

## 2018-06-21 DIAGNOSIS — F419 Anxiety disorder, unspecified: Secondary | ICD-10-CM | POA: Diagnosis not present

## 2018-06-21 DIAGNOSIS — Z87440 Personal history of urinary (tract) infections: Secondary | ICD-10-CM | POA: Diagnosis not present

## 2018-06-21 DIAGNOSIS — I739 Peripheral vascular disease, unspecified: Secondary | ICD-10-CM | POA: Diagnosis not present

## 2018-06-23 DIAGNOSIS — R262 Difficulty in walking, not elsewhere classified: Secondary | ICD-10-CM | POA: Diagnosis not present

## 2018-06-23 DIAGNOSIS — I10 Essential (primary) hypertension: Secondary | ICD-10-CM | POA: Diagnosis not present

## 2018-06-23 DIAGNOSIS — M6281 Muscle weakness (generalized): Secondary | ICD-10-CM | POA: Diagnosis not present

## 2018-06-23 DIAGNOSIS — F0281 Dementia in other diseases classified elsewhere with behavioral disturbance: Secondary | ICD-10-CM | POA: Diagnosis not present

## 2018-06-23 DIAGNOSIS — G309 Alzheimer's disease, unspecified: Secondary | ICD-10-CM | POA: Diagnosis not present

## 2018-06-23 DIAGNOSIS — M0600 Rheumatoid arthritis without rheumatoid factor, unspecified site: Secondary | ICD-10-CM | POA: Diagnosis not present

## 2018-06-26 DIAGNOSIS — I10 Essential (primary) hypertension: Secondary | ICD-10-CM | POA: Diagnosis not present

## 2018-06-26 DIAGNOSIS — F0281 Dementia in other diseases classified elsewhere with behavioral disturbance: Secondary | ICD-10-CM | POA: Diagnosis not present

## 2018-06-26 DIAGNOSIS — M6281 Muscle weakness (generalized): Secondary | ICD-10-CM | POA: Diagnosis not present

## 2018-06-26 DIAGNOSIS — G309 Alzheimer's disease, unspecified: Secondary | ICD-10-CM | POA: Diagnosis not present

## 2018-06-26 DIAGNOSIS — R262 Difficulty in walking, not elsewhere classified: Secondary | ICD-10-CM | POA: Diagnosis not present

## 2018-06-26 DIAGNOSIS — M0600 Rheumatoid arthritis without rheumatoid factor, unspecified site: Secondary | ICD-10-CM | POA: Diagnosis not present

## 2018-06-27 DIAGNOSIS — M0600 Rheumatoid arthritis without rheumatoid factor, unspecified site: Secondary | ICD-10-CM | POA: Diagnosis not present

## 2018-06-27 DIAGNOSIS — I10 Essential (primary) hypertension: Secondary | ICD-10-CM | POA: Diagnosis not present

## 2018-06-27 DIAGNOSIS — R262 Difficulty in walking, not elsewhere classified: Secondary | ICD-10-CM | POA: Diagnosis not present

## 2018-06-27 DIAGNOSIS — F0281 Dementia in other diseases classified elsewhere with behavioral disturbance: Secondary | ICD-10-CM | POA: Diagnosis not present

## 2018-06-27 DIAGNOSIS — M6281 Muscle weakness (generalized): Secondary | ICD-10-CM | POA: Diagnosis not present

## 2018-06-27 DIAGNOSIS — G309 Alzheimer's disease, unspecified: Secondary | ICD-10-CM | POA: Diagnosis not present

## 2018-06-28 DIAGNOSIS — R262 Difficulty in walking, not elsewhere classified: Secondary | ICD-10-CM | POA: Diagnosis not present

## 2018-06-28 DIAGNOSIS — G309 Alzheimer's disease, unspecified: Secondary | ICD-10-CM | POA: Diagnosis not present

## 2018-06-28 DIAGNOSIS — F0281 Dementia in other diseases classified elsewhere with behavioral disturbance: Secondary | ICD-10-CM | POA: Diagnosis not present

## 2018-06-28 DIAGNOSIS — I10 Essential (primary) hypertension: Secondary | ICD-10-CM | POA: Diagnosis not present

## 2018-06-28 DIAGNOSIS — M0600 Rheumatoid arthritis without rheumatoid factor, unspecified site: Secondary | ICD-10-CM | POA: Diagnosis not present

## 2018-06-28 DIAGNOSIS — M6281 Muscle weakness (generalized): Secondary | ICD-10-CM | POA: Diagnosis not present

## 2018-07-02 DIAGNOSIS — F0281 Dementia in other diseases classified elsewhere with behavioral disturbance: Secondary | ICD-10-CM | POA: Diagnosis not present

## 2018-07-02 DIAGNOSIS — G309 Alzheimer's disease, unspecified: Secondary | ICD-10-CM | POA: Diagnosis not present

## 2018-07-02 DIAGNOSIS — M0600 Rheumatoid arthritis without rheumatoid factor, unspecified site: Secondary | ICD-10-CM | POA: Diagnosis not present

## 2018-07-02 DIAGNOSIS — I10 Essential (primary) hypertension: Secondary | ICD-10-CM | POA: Diagnosis not present

## 2018-07-02 DIAGNOSIS — R262 Difficulty in walking, not elsewhere classified: Secondary | ICD-10-CM | POA: Diagnosis not present

## 2018-07-02 DIAGNOSIS — M6281 Muscle weakness (generalized): Secondary | ICD-10-CM | POA: Diagnosis not present

## 2018-07-03 DIAGNOSIS — I10 Essential (primary) hypertension: Secondary | ICD-10-CM | POA: Diagnosis not present

## 2018-07-03 DIAGNOSIS — R262 Difficulty in walking, not elsewhere classified: Secondary | ICD-10-CM | POA: Diagnosis not present

## 2018-07-03 DIAGNOSIS — F0281 Dementia in other diseases classified elsewhere with behavioral disturbance: Secondary | ICD-10-CM | POA: Diagnosis not present

## 2018-07-03 DIAGNOSIS — G309 Alzheimer's disease, unspecified: Secondary | ICD-10-CM | POA: Diagnosis not present

## 2018-07-03 DIAGNOSIS — M6281 Muscle weakness (generalized): Secondary | ICD-10-CM | POA: Diagnosis not present

## 2018-07-03 DIAGNOSIS — M0600 Rheumatoid arthritis without rheumatoid factor, unspecified site: Secondary | ICD-10-CM | POA: Diagnosis not present

## 2018-07-04 DIAGNOSIS — I10 Essential (primary) hypertension: Secondary | ICD-10-CM | POA: Diagnosis not present

## 2018-07-04 DIAGNOSIS — F0281 Dementia in other diseases classified elsewhere with behavioral disturbance: Secondary | ICD-10-CM | POA: Diagnosis not present

## 2018-07-04 DIAGNOSIS — M0600 Rheumatoid arthritis without rheumatoid factor, unspecified site: Secondary | ICD-10-CM | POA: Diagnosis not present

## 2018-07-04 DIAGNOSIS — M6281 Muscle weakness (generalized): Secondary | ICD-10-CM | POA: Diagnosis not present

## 2018-07-04 DIAGNOSIS — G309 Alzheimer's disease, unspecified: Secondary | ICD-10-CM | POA: Diagnosis not present

## 2018-07-04 DIAGNOSIS — R262 Difficulty in walking, not elsewhere classified: Secondary | ICD-10-CM | POA: Diagnosis not present

## 2018-07-06 DIAGNOSIS — M0600 Rheumatoid arthritis without rheumatoid factor, unspecified site: Secondary | ICD-10-CM | POA: Diagnosis not present

## 2018-07-06 DIAGNOSIS — G309 Alzheimer's disease, unspecified: Secondary | ICD-10-CM | POA: Diagnosis not present

## 2018-07-06 DIAGNOSIS — R262 Difficulty in walking, not elsewhere classified: Secondary | ICD-10-CM | POA: Diagnosis not present

## 2018-07-06 DIAGNOSIS — F0281 Dementia in other diseases classified elsewhere with behavioral disturbance: Secondary | ICD-10-CM | POA: Diagnosis not present

## 2018-07-06 DIAGNOSIS — M6281 Muscle weakness (generalized): Secondary | ICD-10-CM | POA: Diagnosis not present

## 2018-07-06 DIAGNOSIS — I10 Essential (primary) hypertension: Secondary | ICD-10-CM | POA: Diagnosis not present

## 2018-07-10 DIAGNOSIS — G309 Alzheimer's disease, unspecified: Secondary | ICD-10-CM | POA: Diagnosis not present

## 2018-07-10 DIAGNOSIS — M0600 Rheumatoid arthritis without rheumatoid factor, unspecified site: Secondary | ICD-10-CM | POA: Diagnosis not present

## 2018-07-10 DIAGNOSIS — I10 Essential (primary) hypertension: Secondary | ICD-10-CM | POA: Diagnosis not present

## 2018-07-10 DIAGNOSIS — M6281 Muscle weakness (generalized): Secondary | ICD-10-CM | POA: Diagnosis not present

## 2018-07-10 DIAGNOSIS — R262 Difficulty in walking, not elsewhere classified: Secondary | ICD-10-CM | POA: Diagnosis not present

## 2018-07-10 DIAGNOSIS — F0281 Dementia in other diseases classified elsewhere with behavioral disturbance: Secondary | ICD-10-CM | POA: Diagnosis not present

## 2018-07-11 DIAGNOSIS — F0281 Dementia in other diseases classified elsewhere with behavioral disturbance: Secondary | ICD-10-CM | POA: Diagnosis not present

## 2018-07-11 DIAGNOSIS — M6281 Muscle weakness (generalized): Secondary | ICD-10-CM | POA: Diagnosis not present

## 2018-07-11 DIAGNOSIS — I10 Essential (primary) hypertension: Secondary | ICD-10-CM | POA: Diagnosis not present

## 2018-07-11 DIAGNOSIS — M0600 Rheumatoid arthritis without rheumatoid factor, unspecified site: Secondary | ICD-10-CM | POA: Diagnosis not present

## 2018-07-11 DIAGNOSIS — G309 Alzheimer's disease, unspecified: Secondary | ICD-10-CM | POA: Diagnosis not present

## 2018-07-11 DIAGNOSIS — R262 Difficulty in walking, not elsewhere classified: Secondary | ICD-10-CM | POA: Diagnosis not present

## 2018-07-12 DIAGNOSIS — G309 Alzheimer's disease, unspecified: Secondary | ICD-10-CM | POA: Diagnosis not present

## 2018-07-12 DIAGNOSIS — R262 Difficulty in walking, not elsewhere classified: Secondary | ICD-10-CM | POA: Diagnosis not present

## 2018-07-12 DIAGNOSIS — M0600 Rheumatoid arthritis without rheumatoid factor, unspecified site: Secondary | ICD-10-CM | POA: Diagnosis not present

## 2018-07-12 DIAGNOSIS — F0281 Dementia in other diseases classified elsewhere with behavioral disturbance: Secondary | ICD-10-CM | POA: Diagnosis not present

## 2018-07-12 DIAGNOSIS — M6281 Muscle weakness (generalized): Secondary | ICD-10-CM | POA: Diagnosis not present

## 2018-07-12 DIAGNOSIS — I10 Essential (primary) hypertension: Secondary | ICD-10-CM | POA: Diagnosis not present

## 2018-07-13 DIAGNOSIS — R262 Difficulty in walking, not elsewhere classified: Secondary | ICD-10-CM | POA: Diagnosis not present

## 2018-07-13 DIAGNOSIS — M6281 Muscle weakness (generalized): Secondary | ICD-10-CM | POA: Diagnosis not present

## 2018-07-13 DIAGNOSIS — F0281 Dementia in other diseases classified elsewhere with behavioral disturbance: Secondary | ICD-10-CM | POA: Diagnosis not present

## 2018-07-13 DIAGNOSIS — M0600 Rheumatoid arthritis without rheumatoid factor, unspecified site: Secondary | ICD-10-CM | POA: Diagnosis not present

## 2018-07-13 DIAGNOSIS — I10 Essential (primary) hypertension: Secondary | ICD-10-CM | POA: Diagnosis not present

## 2018-07-13 DIAGNOSIS — G309 Alzheimer's disease, unspecified: Secondary | ICD-10-CM | POA: Diagnosis not present

## 2018-07-16 DIAGNOSIS — F0281 Dementia in other diseases classified elsewhere with behavioral disturbance: Secondary | ICD-10-CM | POA: Diagnosis not present

## 2018-07-16 DIAGNOSIS — I10 Essential (primary) hypertension: Secondary | ICD-10-CM | POA: Diagnosis not present

## 2018-07-16 DIAGNOSIS — M0600 Rheumatoid arthritis without rheumatoid factor, unspecified site: Secondary | ICD-10-CM | POA: Diagnosis not present

## 2018-07-16 DIAGNOSIS — G309 Alzheimer's disease, unspecified: Secondary | ICD-10-CM | POA: Diagnosis not present

## 2018-07-16 DIAGNOSIS — R262 Difficulty in walking, not elsewhere classified: Secondary | ICD-10-CM | POA: Diagnosis not present

## 2018-07-16 DIAGNOSIS — M6281 Muscle weakness (generalized): Secondary | ICD-10-CM | POA: Diagnosis not present

## 2018-07-17 DIAGNOSIS — R262 Difficulty in walking, not elsewhere classified: Secondary | ICD-10-CM | POA: Diagnosis not present

## 2018-07-17 DIAGNOSIS — M6281 Muscle weakness (generalized): Secondary | ICD-10-CM | POA: Diagnosis not present

## 2018-07-17 DIAGNOSIS — F0281 Dementia in other diseases classified elsewhere with behavioral disturbance: Secondary | ICD-10-CM | POA: Diagnosis not present

## 2018-07-17 DIAGNOSIS — F0390 Unspecified dementia without behavioral disturbance: Secondary | ICD-10-CM | POA: Diagnosis not present

## 2018-07-17 DIAGNOSIS — I10 Essential (primary) hypertension: Secondary | ICD-10-CM | POA: Diagnosis not present

## 2018-07-17 DIAGNOSIS — F419 Anxiety disorder, unspecified: Secondary | ICD-10-CM | POA: Diagnosis not present

## 2018-07-17 DIAGNOSIS — M0600 Rheumatoid arthritis without rheumatoid factor, unspecified site: Secondary | ICD-10-CM | POA: Diagnosis not present

## 2018-07-17 DIAGNOSIS — F39 Unspecified mood [affective] disorder: Secondary | ICD-10-CM | POA: Diagnosis not present

## 2018-07-17 DIAGNOSIS — G309 Alzheimer's disease, unspecified: Secondary | ICD-10-CM | POA: Diagnosis not present

## 2018-07-19 DIAGNOSIS — G309 Alzheimer's disease, unspecified: Secondary | ICD-10-CM | POA: Diagnosis not present

## 2018-07-19 DIAGNOSIS — F0281 Dementia in other diseases classified elsewhere with behavioral disturbance: Secondary | ICD-10-CM | POA: Diagnosis not present

## 2018-07-19 DIAGNOSIS — I10 Essential (primary) hypertension: Secondary | ICD-10-CM | POA: Diagnosis not present

## 2018-07-19 DIAGNOSIS — M0600 Rheumatoid arthritis without rheumatoid factor, unspecified site: Secondary | ICD-10-CM | POA: Diagnosis not present

## 2018-07-19 DIAGNOSIS — R262 Difficulty in walking, not elsewhere classified: Secondary | ICD-10-CM | POA: Diagnosis not present

## 2018-07-19 DIAGNOSIS — M6281 Muscle weakness (generalized): Secondary | ICD-10-CM | POA: Diagnosis not present

## 2018-07-24 DIAGNOSIS — M0600 Rheumatoid arthritis without rheumatoid factor, unspecified site: Secondary | ICD-10-CM | POA: Diagnosis not present

## 2018-07-24 DIAGNOSIS — F0281 Dementia in other diseases classified elsewhere with behavioral disturbance: Secondary | ICD-10-CM | POA: Diagnosis not present

## 2018-07-24 DIAGNOSIS — R262 Difficulty in walking, not elsewhere classified: Secondary | ICD-10-CM | POA: Diagnosis not present

## 2018-07-24 DIAGNOSIS — G309 Alzheimer's disease, unspecified: Secondary | ICD-10-CM | POA: Diagnosis not present

## 2018-07-24 DIAGNOSIS — M6281 Muscle weakness (generalized): Secondary | ICD-10-CM | POA: Diagnosis not present

## 2018-07-24 DIAGNOSIS — I10 Essential (primary) hypertension: Secondary | ICD-10-CM | POA: Diagnosis not present

## 2018-07-26 DIAGNOSIS — R262 Difficulty in walking, not elsewhere classified: Secondary | ICD-10-CM | POA: Diagnosis not present

## 2018-07-26 DIAGNOSIS — I10 Essential (primary) hypertension: Secondary | ICD-10-CM | POA: Diagnosis not present

## 2018-07-26 DIAGNOSIS — F0281 Dementia in other diseases classified elsewhere with behavioral disturbance: Secondary | ICD-10-CM | POA: Diagnosis not present

## 2018-07-26 DIAGNOSIS — G309 Alzheimer's disease, unspecified: Secondary | ICD-10-CM | POA: Diagnosis not present

## 2018-07-26 DIAGNOSIS — M0600 Rheumatoid arthritis without rheumatoid factor, unspecified site: Secondary | ICD-10-CM | POA: Diagnosis not present

## 2018-07-26 DIAGNOSIS — M6281 Muscle weakness (generalized): Secondary | ICD-10-CM | POA: Diagnosis not present

## 2018-07-31 DIAGNOSIS — M6281 Muscle weakness (generalized): Secondary | ICD-10-CM | POA: Diagnosis not present

## 2018-07-31 DIAGNOSIS — R262 Difficulty in walking, not elsewhere classified: Secondary | ICD-10-CM | POA: Diagnosis not present

## 2018-07-31 DIAGNOSIS — G309 Alzheimer's disease, unspecified: Secondary | ICD-10-CM | POA: Diagnosis not present

## 2018-07-31 DIAGNOSIS — M0600 Rheumatoid arthritis without rheumatoid factor, unspecified site: Secondary | ICD-10-CM | POA: Diagnosis not present

## 2018-07-31 DIAGNOSIS — F0281 Dementia in other diseases classified elsewhere with behavioral disturbance: Secondary | ICD-10-CM | POA: Diagnosis not present

## 2018-07-31 DIAGNOSIS — I10 Essential (primary) hypertension: Secondary | ICD-10-CM | POA: Diagnosis not present

## 2018-08-16 DIAGNOSIS — F0390 Unspecified dementia without behavioral disturbance: Secondary | ICD-10-CM | POA: Diagnosis not present

## 2018-08-16 DIAGNOSIS — F39 Unspecified mood [affective] disorder: Secondary | ICD-10-CM | POA: Diagnosis not present

## 2018-08-16 DIAGNOSIS — F419 Anxiety disorder, unspecified: Secondary | ICD-10-CM | POA: Diagnosis not present

## 2018-09-13 DIAGNOSIS — F419 Anxiety disorder, unspecified: Secondary | ICD-10-CM | POA: Diagnosis not present

## 2018-09-20 DIAGNOSIS — F0390 Unspecified dementia without behavioral disturbance: Secondary | ICD-10-CM | POA: Diagnosis not present

## 2018-09-20 DIAGNOSIS — F39 Unspecified mood [affective] disorder: Secondary | ICD-10-CM | POA: Diagnosis not present

## 2018-09-20 DIAGNOSIS — F419 Anxiety disorder, unspecified: Secondary | ICD-10-CM | POA: Diagnosis not present

## 2018-09-27 DIAGNOSIS — F39 Unspecified mood [affective] disorder: Secondary | ICD-10-CM | POA: Diagnosis not present

## 2018-09-27 DIAGNOSIS — F419 Anxiety disorder, unspecified: Secondary | ICD-10-CM | POA: Diagnosis not present

## 2018-10-03 DIAGNOSIS — F0281 Dementia in other diseases classified elsewhere with behavioral disturbance: Secondary | ICD-10-CM | POA: Diagnosis not present

## 2018-10-03 DIAGNOSIS — F419 Anxiety disorder, unspecified: Secondary | ICD-10-CM | POA: Diagnosis not present

## 2018-10-03 DIAGNOSIS — M069 Rheumatoid arthritis, unspecified: Secondary | ICD-10-CM | POA: Diagnosis not present

## 2018-10-03 DIAGNOSIS — Z87440 Personal history of urinary (tract) infections: Secondary | ICD-10-CM | POA: Diagnosis not present

## 2018-10-03 DIAGNOSIS — K5900 Constipation, unspecified: Secondary | ICD-10-CM | POA: Diagnosis not present

## 2018-10-03 DIAGNOSIS — I739 Peripheral vascular disease, unspecified: Secondary | ICD-10-CM | POA: Diagnosis not present

## 2018-10-03 DIAGNOSIS — I10 Essential (primary) hypertension: Secondary | ICD-10-CM | POA: Diagnosis not present

## 2018-10-03 DIAGNOSIS — J449 Chronic obstructive pulmonary disease, unspecified: Secondary | ICD-10-CM | POA: Diagnosis not present

## 2018-10-03 DIAGNOSIS — D519 Vitamin B12 deficiency anemia, unspecified: Secondary | ICD-10-CM | POA: Diagnosis not present

## 2018-10-03 DIAGNOSIS — F3289 Other specified depressive episodes: Secondary | ICD-10-CM | POA: Diagnosis not present

## 2018-10-03 DIAGNOSIS — R451 Restlessness and agitation: Secondary | ICD-10-CM | POA: Diagnosis not present

## 2018-10-03 DIAGNOSIS — G301 Alzheimer's disease with late onset: Secondary | ICD-10-CM | POA: Diagnosis not present

## 2018-10-05 DIAGNOSIS — F329 Major depressive disorder, single episode, unspecified: Secondary | ICD-10-CM | POA: Diagnosis not present

## 2018-10-05 DIAGNOSIS — G309 Alzheimer's disease, unspecified: Secondary | ICD-10-CM | POA: Diagnosis not present

## 2018-10-05 DIAGNOSIS — Z87440 Personal history of urinary (tract) infections: Secondary | ICD-10-CM | POA: Diagnosis not present

## 2018-10-05 DIAGNOSIS — R3981 Functional urinary incontinence: Secondary | ICD-10-CM | POA: Diagnosis not present

## 2018-10-05 DIAGNOSIS — F0281 Dementia in other diseases classified elsewhere with behavioral disturbance: Secondary | ICD-10-CM | POA: Diagnosis not present

## 2018-10-05 DIAGNOSIS — L22 Diaper dermatitis: Secondary | ICD-10-CM | POA: Diagnosis not present

## 2018-10-05 DIAGNOSIS — D510 Vitamin B12 deficiency anemia due to intrinsic factor deficiency: Secondary | ICD-10-CM | POA: Diagnosis not present

## 2018-10-05 DIAGNOSIS — D649 Anemia, unspecified: Secondary | ICD-10-CM | POA: Diagnosis not present

## 2018-10-05 DIAGNOSIS — M069 Rheumatoid arthritis, unspecified: Secondary | ICD-10-CM | POA: Diagnosis not present

## 2018-10-05 DIAGNOSIS — K5901 Slow transit constipation: Secondary | ICD-10-CM | POA: Diagnosis not present

## 2018-10-05 DIAGNOSIS — I10 Essential (primary) hypertension: Secondary | ICD-10-CM | POA: Diagnosis not present

## 2018-10-11 DIAGNOSIS — F329 Major depressive disorder, single episode, unspecified: Secondary | ICD-10-CM | POA: Diagnosis not present

## 2018-10-15 DIAGNOSIS — L84 Corns and callosities: Secondary | ICD-10-CM | POA: Diagnosis not present

## 2018-10-15 DIAGNOSIS — Q845 Enlarged and hypertrophic nails: Secondary | ICD-10-CM | POA: Diagnosis not present

## 2018-10-15 DIAGNOSIS — I739 Peripheral vascular disease, unspecified: Secondary | ICD-10-CM | POA: Diagnosis not present

## 2018-10-15 DIAGNOSIS — L603 Nail dystrophy: Secondary | ICD-10-CM | POA: Diagnosis not present

## 2018-10-15 DIAGNOSIS — B351 Tinea unguium: Secondary | ICD-10-CM | POA: Diagnosis not present

## 2018-10-18 DIAGNOSIS — F419 Anxiety disorder, unspecified: Secondary | ICD-10-CM | POA: Diagnosis not present

## 2018-10-18 DIAGNOSIS — F39 Unspecified mood [affective] disorder: Secondary | ICD-10-CM | POA: Diagnosis not present

## 2018-10-18 DIAGNOSIS — F0390 Unspecified dementia without behavioral disturbance: Secondary | ICD-10-CM | POA: Diagnosis not present

## 2018-10-24 DIAGNOSIS — R54 Age-related physical debility: Secondary | ICD-10-CM | POA: Diagnosis not present

## 2018-10-24 DIAGNOSIS — I10 Essential (primary) hypertension: Secondary | ICD-10-CM | POA: Diagnosis not present

## 2018-10-24 DIAGNOSIS — F0281 Dementia in other diseases classified elsewhere with behavioral disturbance: Secondary | ICD-10-CM | POA: Diagnosis not present

## 2018-10-24 DIAGNOSIS — M069 Rheumatoid arthritis, unspecified: Secondary | ICD-10-CM | POA: Diagnosis not present

## 2018-10-25 DIAGNOSIS — F419 Anxiety disorder, unspecified: Secondary | ICD-10-CM | POA: Diagnosis not present

## 2018-10-25 DIAGNOSIS — F329 Major depressive disorder, single episode, unspecified: Secondary | ICD-10-CM | POA: Diagnosis not present

## 2018-11-01 DIAGNOSIS — F419 Anxiety disorder, unspecified: Secondary | ICD-10-CM | POA: Diagnosis not present

## 2018-11-01 DIAGNOSIS — F329 Major depressive disorder, single episode, unspecified: Secondary | ICD-10-CM | POA: Diagnosis not present

## 2018-11-04 DIAGNOSIS — F0281 Dementia in other diseases classified elsewhere with behavioral disturbance: Secondary | ICD-10-CM | POA: Diagnosis not present

## 2018-11-04 DIAGNOSIS — K5901 Slow transit constipation: Secondary | ICD-10-CM | POA: Diagnosis not present

## 2018-11-04 DIAGNOSIS — D649 Anemia, unspecified: Secondary | ICD-10-CM | POA: Diagnosis not present

## 2018-11-04 DIAGNOSIS — Z87440 Personal history of urinary (tract) infections: Secondary | ICD-10-CM | POA: Diagnosis not present

## 2018-11-04 DIAGNOSIS — R3981 Functional urinary incontinence: Secondary | ICD-10-CM | POA: Diagnosis not present

## 2018-11-04 DIAGNOSIS — F329 Major depressive disorder, single episode, unspecified: Secondary | ICD-10-CM | POA: Diagnosis not present

## 2018-11-04 DIAGNOSIS — G309 Alzheimer's disease, unspecified: Secondary | ICD-10-CM | POA: Diagnosis not present

## 2018-11-04 DIAGNOSIS — I10 Essential (primary) hypertension: Secondary | ICD-10-CM | POA: Diagnosis not present

## 2018-11-04 DIAGNOSIS — D510 Vitamin B12 deficiency anemia due to intrinsic factor deficiency: Secondary | ICD-10-CM | POA: Diagnosis not present

## 2018-11-04 DIAGNOSIS — L22 Diaper dermatitis: Secondary | ICD-10-CM | POA: Diagnosis not present

## 2018-11-04 DIAGNOSIS — M069 Rheumatoid arthritis, unspecified: Secondary | ICD-10-CM | POA: Diagnosis not present

## 2018-11-15 DIAGNOSIS — F39 Unspecified mood [affective] disorder: Secondary | ICD-10-CM | POA: Diagnosis not present

## 2018-11-15 DIAGNOSIS — F0390 Unspecified dementia without behavioral disturbance: Secondary | ICD-10-CM | POA: Diagnosis not present

## 2018-11-15 DIAGNOSIS — F419 Anxiety disorder, unspecified: Secondary | ICD-10-CM | POA: Diagnosis not present

## 2018-12-12 DIAGNOSIS — F331 Major depressive disorder, recurrent, moderate: Secondary | ICD-10-CM | POA: Diagnosis not present

## 2018-12-12 DIAGNOSIS — D519 Vitamin B12 deficiency anemia, unspecified: Secondary | ICD-10-CM | POA: Diagnosis not present

## 2018-12-12 DIAGNOSIS — K5900 Constipation, unspecified: Secondary | ICD-10-CM | POA: Diagnosis not present

## 2018-12-12 DIAGNOSIS — J449 Chronic obstructive pulmonary disease, unspecified: Secondary | ICD-10-CM | POA: Diagnosis not present

## 2018-12-12 DIAGNOSIS — F419 Anxiety disorder, unspecified: Secondary | ICD-10-CM | POA: Diagnosis not present

## 2018-12-12 DIAGNOSIS — I10 Essential (primary) hypertension: Secondary | ICD-10-CM | POA: Diagnosis not present

## 2018-12-12 DIAGNOSIS — M069 Rheumatoid arthritis, unspecified: Secondary | ICD-10-CM | POA: Diagnosis not present

## 2018-12-12 DIAGNOSIS — E785 Hyperlipidemia, unspecified: Secondary | ICD-10-CM | POA: Diagnosis not present

## 2018-12-12 DIAGNOSIS — F0281 Dementia in other diseases classified elsewhere with behavioral disturbance: Secondary | ICD-10-CM | POA: Diagnosis not present

## 2018-12-13 DIAGNOSIS — F39 Unspecified mood [affective] disorder: Secondary | ICD-10-CM | POA: Diagnosis not present

## 2018-12-13 DIAGNOSIS — F0390 Unspecified dementia without behavioral disturbance: Secondary | ICD-10-CM | POA: Diagnosis not present

## 2018-12-13 DIAGNOSIS — F419 Anxiety disorder, unspecified: Secondary | ICD-10-CM | POA: Diagnosis not present

## 2019-01-08 DIAGNOSIS — E559 Vitamin D deficiency, unspecified: Secondary | ICD-10-CM | POA: Diagnosis not present

## 2019-01-08 DIAGNOSIS — D649 Anemia, unspecified: Secondary | ICD-10-CM | POA: Diagnosis not present

## 2019-01-08 DIAGNOSIS — E039 Hypothyroidism, unspecified: Secondary | ICD-10-CM | POA: Diagnosis not present

## 2019-01-08 DIAGNOSIS — D519 Vitamin B12 deficiency anemia, unspecified: Secondary | ICD-10-CM | POA: Diagnosis not present

## 2019-01-08 DIAGNOSIS — E785 Hyperlipidemia, unspecified: Secondary | ICD-10-CM | POA: Diagnosis not present

## 2019-01-08 DIAGNOSIS — Z79899 Other long term (current) drug therapy: Secondary | ICD-10-CM | POA: Diagnosis not present

## 2019-02-27 DIAGNOSIS — F0281 Dementia in other diseases classified elsewhere with behavioral disturbance: Secondary | ICD-10-CM | POA: Diagnosis not present

## 2019-02-27 DIAGNOSIS — D519 Vitamin B12 deficiency anemia, unspecified: Secondary | ICD-10-CM | POA: Diagnosis not present

## 2019-02-27 DIAGNOSIS — E559 Vitamin D deficiency, unspecified: Secondary | ICD-10-CM | POA: Diagnosis not present

## 2019-02-27 DIAGNOSIS — M069 Rheumatoid arthritis, unspecified: Secondary | ICD-10-CM | POA: Diagnosis not present

## 2019-02-27 DIAGNOSIS — K5900 Constipation, unspecified: Secondary | ICD-10-CM | POA: Diagnosis not present

## 2019-02-27 DIAGNOSIS — F419 Anxiety disorder, unspecified: Secondary | ICD-10-CM | POA: Diagnosis not present

## 2019-02-27 DIAGNOSIS — E785 Hyperlipidemia, unspecified: Secondary | ICD-10-CM | POA: Diagnosis not present

## 2019-02-27 DIAGNOSIS — F331 Major depressive disorder, recurrent, moderate: Secondary | ICD-10-CM | POA: Diagnosis not present

## 2019-02-27 DIAGNOSIS — J449 Chronic obstructive pulmonary disease, unspecified: Secondary | ICD-10-CM | POA: Diagnosis not present

## 2019-02-27 DIAGNOSIS — I10 Essential (primary) hypertension: Secondary | ICD-10-CM | POA: Diagnosis not present

## 2019-02-28 DIAGNOSIS — F0281 Dementia in other diseases classified elsewhere with behavioral disturbance: Secondary | ICD-10-CM | POA: Diagnosis not present

## 2019-02-28 DIAGNOSIS — E785 Hyperlipidemia, unspecified: Secondary | ICD-10-CM | POA: Diagnosis not present

## 2019-02-28 DIAGNOSIS — I10 Essential (primary) hypertension: Secondary | ICD-10-CM | POA: Diagnosis not present

## 2019-02-28 DIAGNOSIS — D519 Vitamin B12 deficiency anemia, unspecified: Secondary | ICD-10-CM | POA: Diagnosis not present

## 2019-05-01 DIAGNOSIS — B351 Tinea unguium: Secondary | ICD-10-CM | POA: Diagnosis not present

## 2019-05-01 DIAGNOSIS — F0281 Dementia in other diseases classified elsewhere with behavioral disturbance: Secondary | ICD-10-CM | POA: Diagnosis not present

## 2019-05-08 DIAGNOSIS — D519 Vitamin B12 deficiency anemia, unspecified: Secondary | ICD-10-CM | POA: Diagnosis not present

## 2019-05-08 DIAGNOSIS — E785 Hyperlipidemia, unspecified: Secondary | ICD-10-CM | POA: Diagnosis not present

## 2019-05-08 DIAGNOSIS — F419 Anxiety disorder, unspecified: Secondary | ICD-10-CM | POA: Diagnosis not present

## 2019-05-08 DIAGNOSIS — E559 Vitamin D deficiency, unspecified: Secondary | ICD-10-CM | POA: Diagnosis not present

## 2019-05-08 DIAGNOSIS — K5900 Constipation, unspecified: Secondary | ICD-10-CM | POA: Diagnosis not present

## 2019-05-08 DIAGNOSIS — J449 Chronic obstructive pulmonary disease, unspecified: Secondary | ICD-10-CM | POA: Diagnosis not present

## 2019-05-08 DIAGNOSIS — I10 Essential (primary) hypertension: Secondary | ICD-10-CM | POA: Diagnosis not present

## 2019-05-08 DIAGNOSIS — F331 Major depressive disorder, recurrent, moderate: Secondary | ICD-10-CM | POA: Diagnosis not present

## 2019-05-08 DIAGNOSIS — F0281 Dementia in other diseases classified elsewhere with behavioral disturbance: Secondary | ICD-10-CM | POA: Diagnosis not present

## 2019-05-08 DIAGNOSIS — M069 Rheumatoid arthritis, unspecified: Secondary | ICD-10-CM | POA: Diagnosis not present

## 2019-05-28 DIAGNOSIS — F419 Anxiety disorder, unspecified: Secondary | ICD-10-CM | POA: Diagnosis not present

## 2019-05-28 DIAGNOSIS — F0390 Unspecified dementia without behavioral disturbance: Secondary | ICD-10-CM | POA: Diagnosis not present

## 2019-05-28 DIAGNOSIS — F39 Unspecified mood [affective] disorder: Secondary | ICD-10-CM | POA: Diagnosis not present

## 2019-06-13 DIAGNOSIS — I739 Peripheral vascular disease, unspecified: Secondary | ICD-10-CM | POA: Diagnosis not present

## 2019-06-13 DIAGNOSIS — Q845 Enlarged and hypertrophic nails: Secondary | ICD-10-CM | POA: Diagnosis not present

## 2019-06-20 DIAGNOSIS — F0390 Unspecified dementia without behavioral disturbance: Secondary | ICD-10-CM | POA: Diagnosis not present

## 2019-06-20 DIAGNOSIS — F39 Unspecified mood [affective] disorder: Secondary | ICD-10-CM | POA: Diagnosis not present

## 2019-06-20 DIAGNOSIS — F419 Anxiety disorder, unspecified: Secondary | ICD-10-CM | POA: Diagnosis not present

## 2019-07-09 ENCOUNTER — Encounter (HOSPITAL_COMMUNITY): Payer: Self-pay

## 2019-07-09 ENCOUNTER — Emergency Department (HOSPITAL_COMMUNITY): Payer: Medicare Other

## 2019-07-09 ENCOUNTER — Inpatient Hospital Stay (HOSPITAL_COMMUNITY)
Admission: EM | Admit: 2019-07-09 | Discharge: 2019-07-17 | DRG: 853 | Disposition: A | Discharge status: Home / Self Care | Payer: Medicare Other | Attending: Internal Medicine | Admitting: Internal Medicine

## 2019-07-09 ENCOUNTER — Other Ambulatory Visit: Payer: Self-pay

## 2019-07-09 DIAGNOSIS — Z20828 Contact with and (suspected) exposure to other viral communicable diseases: Secondary | ICD-10-CM | POA: Diagnosis present

## 2019-07-09 DIAGNOSIS — N136 Pyonephrosis: Secondary | ICD-10-CM | POA: Diagnosis present

## 2019-07-09 DIAGNOSIS — A419 Sepsis, unspecified organism: Secondary | ICD-10-CM | POA: Diagnosis present

## 2019-07-09 DIAGNOSIS — L89159 Pressure ulcer of sacral region, unspecified stage: Secondary | ICD-10-CM | POA: Diagnosis present

## 2019-07-09 DIAGNOSIS — F0390 Unspecified dementia without behavioral disturbance: Secondary | ICD-10-CM | POA: Diagnosis present

## 2019-07-09 DIAGNOSIS — R5381 Other malaise: Secondary | ICD-10-CM | POA: Diagnosis not present

## 2019-07-09 DIAGNOSIS — Z803 Family history of malignant neoplasm of breast: Secondary | ICD-10-CM

## 2019-07-09 DIAGNOSIS — R6520 Severe sepsis without septic shock: Secondary | ICD-10-CM | POA: Diagnosis present

## 2019-07-09 DIAGNOSIS — M79606 Pain in leg, unspecified: Secondary | ICD-10-CM

## 2019-07-09 DIAGNOSIS — M25569 Pain in unspecified knee: Secondary | ICD-10-CM | POA: Diagnosis present

## 2019-07-09 DIAGNOSIS — G9341 Metabolic encephalopathy: Secondary | ICD-10-CM | POA: Diagnosis present

## 2019-07-09 DIAGNOSIS — M79661 Pain in right lower leg: Secondary | ICD-10-CM | POA: Diagnosis not present

## 2019-07-09 DIAGNOSIS — K228 Other specified diseases of esophagus: Secondary | ICD-10-CM | POA: Diagnosis present

## 2019-07-09 DIAGNOSIS — Z7401 Bed confinement status: Secondary | ICD-10-CM | POA: Diagnosis not present

## 2019-07-09 DIAGNOSIS — M069 Rheumatoid arthritis, unspecified: Secondary | ICD-10-CM | POA: Diagnosis present

## 2019-07-09 DIAGNOSIS — W19XXXA Unspecified fall, initial encounter: Secondary | ICD-10-CM | POA: Diagnosis not present

## 2019-07-09 DIAGNOSIS — G9340 Encephalopathy, unspecified: Secondary | ICD-10-CM | POA: Diagnosis not present

## 2019-07-09 DIAGNOSIS — N179 Acute kidney failure, unspecified: Secondary | ICD-10-CM | POA: Diagnosis present

## 2019-07-09 DIAGNOSIS — I959 Hypotension, unspecified: Secondary | ICD-10-CM | POA: Diagnosis not present

## 2019-07-09 DIAGNOSIS — R1310 Dysphagia, unspecified: Secondary | ICD-10-CM | POA: Diagnosis not present

## 2019-07-09 DIAGNOSIS — R4182 Altered mental status, unspecified: Secondary | ICD-10-CM | POA: Diagnosis not present

## 2019-07-09 DIAGNOSIS — D473 Essential (hemorrhagic) thrombocythemia: Secondary | ICD-10-CM | POA: Diagnosis not present

## 2019-07-09 DIAGNOSIS — L89152 Pressure ulcer of sacral region, stage 2: Secondary | ICD-10-CM | POA: Diagnosis not present

## 2019-07-09 DIAGNOSIS — D72829 Elevated white blood cell count, unspecified: Secondary | ICD-10-CM | POA: Diagnosis not present

## 2019-07-09 DIAGNOSIS — R001 Bradycardia, unspecified: Secondary | ICD-10-CM | POA: Diagnosis not present

## 2019-07-09 DIAGNOSIS — M25571 Pain in right ankle and joints of right foot: Secondary | ICD-10-CM | POA: Diagnosis not present

## 2019-07-09 DIAGNOSIS — Z7982 Long term (current) use of aspirin: Secondary | ICD-10-CM

## 2019-07-09 DIAGNOSIS — I10 Essential (primary) hypertension: Secondary | ICD-10-CM | POA: Diagnosis present

## 2019-07-09 DIAGNOSIS — R270 Ataxia, unspecified: Secondary | ICD-10-CM | POA: Diagnosis not present

## 2019-07-09 DIAGNOSIS — R296 Repeated falls: Secondary | ICD-10-CM | POA: Diagnosis present

## 2019-07-09 DIAGNOSIS — D62 Acute posthemorrhagic anemia: Secondary | ICD-10-CM | POA: Diagnosis present

## 2019-07-09 DIAGNOSIS — F03A0 Unspecified dementia, mild, without behavioral disturbance, psychotic disturbance, mood disturbance, and anxiety: Secondary | ICD-10-CM | POA: Diagnosis present

## 2019-07-09 DIAGNOSIS — R0989 Other specified symptoms and signs involving the circulatory and respiratory systems: Secondary | ICD-10-CM

## 2019-07-09 DIAGNOSIS — R0902 Hypoxemia: Secondary | ICD-10-CM | POA: Diagnosis not present

## 2019-07-09 DIAGNOSIS — L8990 Pressure ulcer of unspecified site, unspecified stage: Secondary | ICD-10-CM | POA: Insufficient documentation

## 2019-07-09 DIAGNOSIS — N132 Hydronephrosis with renal and ureteral calculous obstruction: Secondary | ICD-10-CM | POA: Diagnosis not present

## 2019-07-09 DIAGNOSIS — M2550 Pain in unspecified joint: Secondary | ICD-10-CM | POA: Diagnosis not present

## 2019-07-09 DIAGNOSIS — R509 Fever, unspecified: Secondary | ICD-10-CM | POA: Diagnosis not present

## 2019-07-09 DIAGNOSIS — M1711 Unilateral primary osteoarthritis, right knee: Secondary | ICD-10-CM | POA: Diagnosis not present

## 2019-07-09 DIAGNOSIS — N390 Urinary tract infection, site not specified: Secondary | ICD-10-CM | POA: Diagnosis not present

## 2019-07-09 MED ORDER — ACETAMINOPHEN 325 MG PO TABS: 650.0000 mg | ORAL_TABLET | Freq: Four times a day (QID) | ORAL | Status: DC | PRN

## 2019-07-09 NOTE — ED Provider Notes (Signed)
[facility] DEPT Provider Note   CSN: [id number] Arrival date & time: 07/09/19  2145     History   Chief Complaint Chief Complaint  Patient presents with   Altered Mental Status   Fall    HPI Danielle Dixon is a 78 y.o. female with history of dementia and rheumatoid arthritis on methotrexate who presents to the Emergency Department by EMS from [facility] with a chief complaint of recurrent falls today.   Spoke with Varney Biles, med tech, at [employer] notes that the patient, who ambulates with a walker at baseline, had been unable to walk earlier in the day and had been sitting in her chair.  At approximately 20:15, staff walked in the room to give the patient her evening medications and found her on the floor side of her chair.  The assessed her for wounds and placed her back in her chair.  At approximately, staff checked on her again and found her in the floor in front her of chair and "was blue." Varney Biles did not do this on her evaluation.  She states that the patient's mouth was rhythmically twitching and she had some twitching of her arms and legs that persisted until EMS arrived.  She states that during this time but the patient did have her eyes open and was intermittently able to speak to staff.  She was able to state her name when asked at times, but other times was unable to answer.  She also notes that her speech was very garbled when she attempted to answer questions and seemed to be going "in and out". Staff states at baseline the patient is oriented to person, time, and place.  Staff also noted that she seemed to have difficulty lifting her left leg when they attempted to stand her up and walker.  She takes a baby aspirin daily.  No history of seizures.   Level 5 caveat secondary to dementia.     The history is provided by the nursing home, the EMS personnel and medical records. No language interpreter was used.    Past Medical  History:  Diagnosis Date   Dementia ([place])    Dyspnea 06/25/2013   Leukocytosis    Leukocytosis, unspecified 06/25/2013   Leukocytosis, unspecified 06/25/2013   Psoriasis    Unspecified vitamin D deficiency 06/25/2013    Patient Active Problem List   Diagnosis Date Noted   Pyohydronephrosis 07/10/2019   Sepsis ([place]) 07/10/2019   Mild dementia ([place]) [id number]   Acute metabolic encephalopathy [id number]   Rheumatoid arthritis ([place]) 07/10/2019   Leukocytosis, unspecified 06/25/2013   Dyspnea 06/25/2013   Thrombocytosis ([place]) 06/25/2013   Leukocytosis, unspecified 06/25/2013   Need for prophylactic vaccination and inoculation against influenza 06/25/2013   Unspecified vitamin D deficiency 06/25/2013    Past Surgical History:  Procedure Laterality Date   APPENDECTOMY     knee replacment Left    TOE SURGERY       OB History   No obstetric history on file.      Home Medications    Prior to Admission medications   Medication Sig Start Date End Date Taking? Authorizing Provider  albuterol (VENTOLIN HFA) 108 (90 Base) MCG/ACT inhaler Inhale 2 puffs into the lungs 4 (four) times daily.   Yes [provider]  ARIPiprazole (ABILIFY) 2 MG tablet Take 2 mg by mouth daily.   Yes [provider]  aspirin 81 MG tablet Take 81 mg by mouth daily.   Yes [provider]  atenolol (TENORMIN) 25 MG tablet Take 25 mg by mouth daily.   Yes [provider]  buPROPion (WELLBUTRIN SR) 100 MG 12 hr tablet Take 100 mg by mouth daily.   Yes [provider]  cholecalciferol (VITAMIN D) 1000 UNITS tablet Take 1 tablet (1,000 Units total) by mouth daily. 06/25/13  Yes Gorsuch, Ni, MD  donepezil (ARICEPT) 10 MG tablet Take 10 mg by mouth at bedtime. Unsure of dose    Yes [provider]  folic acid (FOLVITE) 1 MG tablet Take 1 mg by mouth daily.   Yes [provider]  methotrexate (RHEUMATREX) 2.5 MG tablet Take 17.5  mg by mouth every Wednesday.    Yes [provider]  PARoxetine (PAXIL-CR) 25 MG 24 hr tablet Take 25 mg by mouth daily.   Yes [provider]  simvastatin (ZOCOR) 10 MG tablet Take 10 mg by mouth at bedtime. Unsure of dose   Yes [provider]  vitamin B-12 (CYANOCOBALAMIN) 500 MCG tablet Take 500 mcg by mouth daily.   Yes [provider]  zafirlukast (ACCOLATE) 20 MG tablet Take 20 mg by mouth 2 (two) times daily.   Yes [provider]    Family History Family History  Problem Relation Age of Onset   Cancer Mother        breast cancer   Cancer Father        lung    Social History Social History   Tobacco Use   Smoking status: Never Smoker  Substance Use Topics   Alcohol use: No   Drug use: No     Allergies   Patient has no known allergies.   Review of Systems Review of Systems  Unable to perform ROS: Dementia     Physical Exam Updated Vital Signs BP (!) 145/77    Pulse 71    Temp (!) 102.9 F (39.4 C) (Rectal)    Resp (!) 23    SpO2 92%   Physical Exam Vitals signs and nursing note reviewed.  Constitutional:      General: She is not in acute distress.    Appearance: She is not ill-appearing, toxic-appearing or diaphoretic.     Comments: C-collar in place.   HENT:     Head: Normocephalic.  Eyes:     Conjunctiva/sclera: Conjunctivae normal.     Comments: Drainage noted to the left eye.  Bilateral eyes are noninjected.  Neck:     Musculoskeletal: Neck supple.  Cardiovascular:     Rate and Rhythm: Normal rate and regular rhythm.     Heart sounds: No murmur. No friction rub. No gallop.   Pulmonary:     Effort: Pulmonary effort is normal. No respiratory distress.     Breath sounds: No stridor. No wheezing, rhonchi or rales.     Comments: No tenderness to palpation to the bilateral chest wall. Chest:     Chest wall: No tenderness.  Abdominal:     General: There is no distension.     Palpations: Abdomen is  soft. There is no mass.     Tenderness: There is no abdominal tenderness. There is no right CVA tenderness, left CVA tenderness, guarding or rebound.     Hernia: No hernia is present.     Comments: Abdomen is soft and nontender.  Musculoskeletal:     Right lower leg: No edema.     Left lower leg: No edema.     Comments: Bruising and superficial abrasions noted  to the right lower extremity.  Tender to palpation diffusely to the right knee and patient winces with range of motion of the right knee.  Able to hold the bilateral legs in the air independently.  She has diffuse boutonniere deformities and nodules noted to the hands and feet, consistent with rheumatoid arthritis.  No tenderness to the cervical, thoracic, or lumbar spinous processes or bilateral paraspinal muscles.  Skin:    General: Skin is warm.     Findings: No rash.  Neurological:     Mental Status: She is alert.     Comments:   She is oriented to name.  States her birthday is [birth year].  Does not respond when asked city, state, or current year.  She has intermittent dysarthria.  Intermittently follows simple commands.  Psychiatric:        Behavior: Behavior normal.      ED Treatments / Results  Labs (all labs ordered are listed, but only abnormal results are displayed) Labs Reviewed  URINALYSIS, ROUTINE W REFLEX MICROSCOPIC - Abnormal; Notable for the following components:      Result Value   Color, Urine AMBER (*)    APPearance HAZY (*)    Hgb urine dipstick MODERATE (*)    Ketones, ur 5 (*)    Protein, ur 100 (*)    Leukocytes,Ua LARGE (*)    RBC / HPF >50 (*)    Bacteria, UA MANY (*)    All other components within normal limits  CBC WITH DIFFERENTIAL/PLATELET - Abnormal; Notable for the following components:   WBC 14.2 (*)    Neutro Abs 13.2 (*)    Lymphs Abs 0.6 (*)    Abs Immature Granulocytes 0.09 (*)    All other components within normal limits  COMPREHENSIVE METABOLIC PANEL - Abnormal; Notable for the  following components:   Potassium 3.4 (*)    CO2 21 (*)    Glucose, Bld 172 (*)    Creatinine, Ser 1.51 (*)    Calcium 8.6 (*)    Total Protein 6.3 (*)    Albumin 3.2 (*)    Total Bilirubin 2.1 (*)    GFR calc non Af Amer 33 (*)    GFR calc Af Amer 38 (*)    All other components within normal limits  LACTIC ACID, PLASMA - Abnormal; Notable for the following components:   Lactic Acid, Venous 2.6 (*)    All other components within normal limits  CBG MONITORING, ED - Abnormal; Notable for the following components:   Glucose-Capillary 171 (*)    All other components within normal limits  TROPONIN I (HIGH SENSITIVITY) - Abnormal; Notable for the following components:   Troponin I (High Sensitivity) 34 (*)    All other components within normal limits  TROPONIN I (HIGH SENSITIVITY) - Abnormal; Notable for the following components:   Troponin I (High Sensitivity) 31 (*)    All other components within normal limits  SARS CORONAVIRUS 2 BY RT PCR (HOSPITAL ORDER, [place])  PHOSPHORUS  MAGNESIUM  LACTIC ACID, PLASMA  HEMOGLOBIN A1C    EKG EKG Interpretation  Date/Time:  Tuesday July 09 2019 23:09:01 EDT Ventricular Rate:  54 PR Interval:    QRS Duration: 108 QT Interval:  646 QTC Calculation: 613 R Axis:   -46 Text Interpretation: Sinus rhythm Left ventricular hypertrophy Inferior infarct, old Prolonged QT interval Confirmed by Addison Lank [phone]) on 07/10/2019 1:06:36 AM   Radiology Dg Chest 1 View  Result Date:  07/10/2019 CLINICAL DATA:  Fevers and hypoxia EXAM: CHEST  1 VIEW COMPARISON:  06/25/2013 FINDINGS: Cardiac shadow is stable. Aortic calcifications are seen. The lungs are well aerated bilaterally. No focal infiltrate or sizable effusion is noted. No acute bony abnormality is seen. IMPRESSION: No acute abnormality noted. Electronically Signed   By: Inez Catalina M.D.   On: 07/10/2019 02:14   Dg Knee 2 Views Right  Result Date:  07/09/2019 CLINICAL DATA:  78 year old female with right lower extremity pain. EXAM: RIGHT KNEE - 1-2 VIEW; RIGHT ANKLE - COMPLETE 3+ VIEW; RIGHT TIBIA AND FIBULA - 2 VIEW COMPARISON:  None. FINDINGS: There is no acute fracture or dislocation. The bones are osteopenic. 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 Fixation screw through the proximal first metatarsal noted. The ankle mortise is intact. The soft tissues are unremarkable. No joint effusion. IMPRESSION: 1. No acute fracture or dislocation. 2. 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 Electronically Signed   By: Anner Crete M.D.   On: 07/09/2019 23:04   Dg Tibia/fibula Right  Result Date: 07/09/2019 CLINICAL DATA:  78 year old female with right lower extremity pain. EXAM: RIGHT KNEE - 1-2 VIEW; RIGHT ANKLE - COMPLETE 3+ VIEW; RIGHT TIBIA AND FIBULA - 2 VIEW COMPARISON:  None. FINDINGS: There is no acute fracture or dislocation. The bones are osteopenic. 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 Fixation screw through the proximal first metatarsal noted. The ankle mortise is intact. The soft tissues are unremarkable. No joint effusion. IMPRESSION: 1. No acute fracture or dislocation. 2. 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 Electronically Signed   By: Anner Crete M.D.   On: 07/09/2019 23:04   Dg Ankle Complete Right  Result Date: 07/09/2019 CLINICAL DATA:  78 year old female with right lower extremity pain. EXAM: RIGHT KNEE - 1-2 VIEW; RIGHT ANKLE - COMPLETE 3+ VIEW; RIGHT TIBIA AND FIBULA - 2 VIEW COMPARISON:  None. FINDINGS: There is no acute fracture or dislocation. The bones are osteopenic. 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 Fixation screw through the proximal first metatarsal  noted. The ankle mortise is intact. The soft tissues are unremarkable. No joint effusion. IMPRESSION: 1. No acute fracture or dislocation. 2. 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 Electronically Signed   By: Anner Crete M.D.   On: 07/09/2019 23:04   Ct Head Wo Contrast  Result Date: 07/09/2019 CLINICAL DATA:  78 year old female with ataxia. EXAM: CT HEAD WITHOUT CONTRAST CT CERVICAL SPINE WITHOUT CONTRAST TECHNIQUE: Multidetector CT imaging of the head and cervical spine was performed following the standard protocol without intravenous contrast. Multiplanar CT image reconstructions of the cervical spine were also generated. COMPARISON:  Head CT dated 04/23/2016. FINDINGS: CT HEAD FINDINGS Brain: Moderate age-related atrophy and chronic microvascular ischemic changes. Focal area of white matter hypodensity in the right frontal convexity (series 3, image 25), likely chronic. 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 There is no acute intracranial hemorrhage. No mass effect or midline shift. No extra-axial fluid collection. Vascular: No hyperdense vessel or unexpected calcification. Skull: Normal. Negative for fracture or focal lesion. Sinuses/Orbits: There is partial opacification of the sphenoid sinuses with air-fluid level. The mastoid air cells are clear. Other: None CT CERVICAL SPINE FINDINGS Alignment: No acute subluxation. Skull base and vertebrae: No acute fracture. Osteopenia. Soft tissues and spinal canal: No prevertebral fluid  or swelling. No visible canal hematoma. Disc levels: Multilevel degenerative changes with endplate irregularity and disc space narrowing. Upper chest: Mild emphysema. Other: Bilateral carotid bulb calcified plaques. IMPRESSION: 1. No acute intracranial hemorrhage. 2. Age-related atrophy and chronic microvascular ischemic changes. Focal area of white matter hypodensity in the right frontal convexity, likely chronic. MRI may provide better evaluation if  there is high clinical concern for acute infarct. 3. No acute/traumatic cervical spine pathology. Electronically Signed   By: Anner Crete M.D.   On: 07/09/2019 23:37   Ct Cervical Spine Wo Contrast  Result Date: 07/09/2019 CLINICAL DATA:  78 year old female with ataxia. EXAM: CT HEAD WITHOUT CONTRAST CT CERVICAL SPINE WITHOUT CONTRAST TECHNIQUE: Multidetector CT imaging of the head and cervical spine was performed following the standard protocol without intravenous contrast. Multiplanar CT image reconstructions of the cervical spine were also generated. COMPARISON:  Head CT dated 04/23/2016. FINDINGS: CT HEAD FINDINGS Brain: Moderate age-related atrophy and chronic microvascular ischemic changes. Focal area of white matter hypodensity in the right frontal convexity (series 3, image 25), likely chronic. 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 There is no acute intracranial hemorrhage. No mass effect or midline shift. No extra-axial fluid collection. Vascular: No hyperdense vessel or unexpected calcification. Skull: Normal. Negative for fracture or focal lesion. Sinuses/Orbits: There is partial opacification of the sphenoid sinuses with air-fluid level. The mastoid air cells are clear. Other: None CT CERVICAL SPINE FINDINGS Alignment: No acute subluxation. Skull base and vertebrae: No acute fracture. Osteopenia. Soft tissues and spinal canal: No prevertebral fluid or swelling. No visible canal hematoma. Disc levels: Multilevel degenerative changes with endplate irregularity and disc space narrowing. Upper chest: Mild emphysema. Other: Bilateral carotid bulb calcified plaques. IMPRESSION: 1. No acute intracranial hemorrhage. 2. Age-related atrophy and chronic microvascular ischemic changes. Focal area of white matter hypodensity in the right frontal convexity, likely chronic. MRI may provide better evaluation if there is high clinical concern for acute infarct. 3. No acute/traumatic cervical  spine pathology. Electronically Signed   By: Anner Crete M.D.   On: 07/09/2019 23:37   Ct Renal Stone Study  Result Date: 07/10/2019 CLINICAL DATA:  78 year old female with flank pain. EXAM: CT ABDOMEN AND PELVIS WITHOUT CONTRAST TECHNIQUE: Multidetector CT imaging of the abdomen and pelvis was performed following the standard protocol without IV contrast. COMPARISON:  None. FINDINGS: Evaluation of this exam is limited in the absence of intravenous contrast. Evaluation is also limited due to respiratory motion artifact. Lower chest: Bibasilar streaky densities may represent atelectasis although infiltrate is not excluded. Clinical correlation is recommended. There is coronary vascular calcification. No intra-abdominal free air or free fluid. Hepatobiliary: The liver is grossly unremarkable. No intrahepatic biliary ductal dilatation. Faint subcentimeter hypodense focus in the left lobe of the liver is not characterized. No calcified gallstone. Pancreas: The pancreas is grossly unremarkable. Spleen: Normal in size without focal abnormality. Adrenals/Urinary Tract: The adrenal glands are unremarkable there are multiple bilateral renal calculi. There is a 1 cm stone in the left ureteropelvic junction with associated left-sided hydronephrosis. There is a large cystic structure in the mid to upper pole of the left kidney and renal hilum measuring up to approximately 8 cm in length. This may represent several renal and parapelvic cysts or a severe hydronephrosis. Evaluation is very limited due to respiratory motion artifact. This can be better evaluated with ultrasound or CT with IV contrast and excretory phase imaging. Several additional nonobstructing stones noted in the left kidney. 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 There is no hydronephrosis on the right. The visualized ureters appear unremarkable. The bilateral bladder  diverticula noted measuring approximately 2.2 cm on the right. There is a 2.5 x 1.2 cm lesion along the left bladder wall likely a bladder diverticulum. High attenuating content within this structure may represent proteinaceous or hemorrhagic debris. Stomach/Bowel: There is sigmoid diverticulosis without active inflammatory changes. There is no bowel obstruction or active inflammation. The appendix is not visualized with certainty. No inflammatory changes identified in the right lower quadrant. Vascular/Lymphatic: Moderate aortoiliac atherosclerotic disease. The IVC is unremarkable. No portal venous gas. There is no adenopathy. Reproductive: Partially calcified right fundal fibroid measures up to 5 cm. Other: None Musculoskeletal: Osteopenia with degenerative changes of the spine. No acute osseous pathology. IMPRESSION: 1. A 1 cm left UPJ stone with associated left-sided hydronephrosis. Large left renal cyst versus moderate/severe hydronephrosis. Evaluation is very limited due to respiratory motion artifact and in the absence of intravenous contrast. 2. 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 No hydronephrosis on the right. 3. Sigmoid diverticulosis. No bowel obstruction or active inflammation. Aortic Atherosclerosis (ICD10-I70.0). Electronically Signed   By: Anner Crete M.D.   On: 07/10/2019 03:17    Procedures .Critical Care Performed by: Joanne Gavel, PA-C Authorized by: Joanne Gavel, PA-C   Critical care provider statement:    Critical care time was exclusive of:  Separately billable procedures and treating other patients and teaching time   Critical care was necessary to treat or prevent imminent or life-threatening deterioration of the following conditions:  Sepsis   Critical care was time spent personally by me on the following activities:  Ordering and performing treatments and interventions, ordering and review  of laboratory studies, ordering and review of radiographic studies, pulse oximetry, re-evaluation of patient's condition, review of old charts, obtaining history from patient or surrogate, evaluation of patient's response to treatment, discussions with primary provider, discussions with consultants and examination of patient   (including critical care time)  Medications Ordered in ED Medications  acetaminophen (TYLENOL) tablet 1,000 mg (1,000 mg Oral Not Given 07/10/19 0133)  0.9 %  sodium chloride infusion ( Intravenous New Bag/Given 07/10/19 0425)  insulin aspart (novoLOG) injection 0-9 Units (has no administration in time range)  acetaminophen (TYLENOL) suppository 650 mg (650 mg Rectal Given 07/10/19 0143)  ceFEPIme (MAXIPIME) 2 g in sodium chloride 0.9 % 100 mL IVPB (0 g Intravenous Stopped 07/10/19 0322)  sodium chloride 0.9 % bolus 1,000 mL (0 mLs Intravenous Stopped 07/10/19 0416)  acetaminophen (TYLENOL) suppository 325 mg (325 mg Rectal Given 07/10/19 0402)  fentaNYL (SUBLIMAZE) 100 MCG/2ML injection (has no administration in time range)  propofol (DIPRIVAN) 10 mg/mL bolus/IV push (has no administration in time range)     Initial Impression / Assessment and Plan / ED Course  I have reviewed the triage vital signs and the nursing notes.  Pertinent labs & imaging results that were available during my care of the patient were reviewed by me and considered in my medical decision making (see chart for details).  Clinical Course as of Jul 09 433  Wed Jul 10, 2019  0020 Called and spoke with the patient's emergency contact, her sister, [name], regarding imaging results.  [name] would like a call in the morning after the patient is admitted with an update.   [MM]  Clinical Course User Index [MM] Machel Violante A, PA-C       78 year old female with history of dementia and rheumatoid arthritis on methotrexate presenting by EMS after recurrent falls, onset today.  There was concern  that the patient appeared blue while laying on the floor after the second fall at approximately 2045.  Staff also noted that she had dysarthria and appeared to have some left leg weakness.  On my exam, no extremity weakness is noted, but the patient did intermittently have garbled speech/dysarthria.  The patient was discussed with Dr. Jeris Penta, attending physician.  She is outside of the window for acute CVA.  Staff also noted that she appeared to have rhythmic shaking and jerking of the upper and lower extremities as well as rhythmic mouth twitching, concerning for seizures.  No history of seizures.  On my evaluation, she had bruises and superficial abrasions noted to the right lower extremity.  Neuro exam is limited as the patient has dementia.  She does know her name, but is unable to state her birthday, city, state, or current year.  Of note, the patient's sister does note that the patient calls [facility] "the hotel".  Per family and [facility] staff, there appears to be mixed opinions about the patient's baseline mental status.  CT head with a focal area of white matter hypodensity in the right frontal convexity that is likely chronic, but she will likely require an MRI for further evaluation.  She has a new AKI with a creatinine of 1.51 that is also been treated with IV fluids.  EKG with prolonged QTC of greater than 600.  Troponin is elevated, but trend is flat.  Initially, the patient does not have any rhythmic shaking or jerking.  On reevaluation, the patient appears to have rigors of the bilateral upper extremities.  Rectal temp reveals a fever of 102.4.  Lactate is elevated at 2.6.  She was given IV fluids, but since lactate is not elevated and 4, 30 cc/kg fluid bolus was not given.  Sepsis order set was not initially used as there was low clinical suspicion for sepsis.  She has a leukocytosis of 14.2.  She was initially treated with vancomycin and cefepime for fever of unknown etiology.  The  patient does have a cough, but chest x-ray did not demonstrate pneumonia.  COVID-19 test is negative.  Chest x-ray is unremarkable.  Labs are not indicative of septic shock at this time.  UA with large leukocyte esterase, moderate hemoglobinuria, and amorphous crystals are noted.  The patient has no history of nephrolithiasis, but given urinalysis, CT stone study was obtained, which demonstrated obstructive 1 cm stone at the left UPJ with left-sided hydronephrosis.  There is also concern for left large renal cyst versus moderate to severe hydronephrosis, but evaluation is limited secondary to motion artifact.  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  Spoke with Dr. Gloriann Loan with urology who will come to evaluate the patient in the ER.   The patient was given rectal Tylenol as she was unable to swallow Tylenol orally.  Nursing staff will perform stroke swallow screen.  She was unable to pass stroke swallow study per nursing staff.  Dr. Gloriann Loan plans to take the patient to the OR for cystoscopy with stent placement.  Consult to the hospitalist team and Dr. Alcario Drought will admit for sepsis secondary to pyohydronephrosis. The patient  appears reasonably stabilized for admission considering the current resources, flow, and capabilities available in the ED at this time, and I doubt any other [place] requiring further screening and/or treatment in the ED prior to admission.   Final Clinical Impressions(s) / ED Diagnoses   Final diagnoses:  Sepsis with acute renal failure without septic shock, due to unspecified organism, unspecified acute renal failure type [facility])  Pyohydronephrosis    ED Discharge Orders    None       Joanne Gavel, PA-C 07/10/19 [place], [place], MD 07/11/19 [phone]

## 2019-07-09 NOTE — ED Notes (Signed)
Patient transported to X-ray 

## 2019-07-09 NOTE — ED Triage Notes (Signed)
Pt BIB EMS from [facility] [address]. Pt had 2-3 falls today. Facility reports pt is not "acting right" so they suspect UTI.   102/43 HR 50 RR 16 96% RA CBG 181 20G LW

## 2019-07-10 ENCOUNTER — Encounter (HOSPITAL_COMMUNITY): Payer: Self-pay

## 2019-07-10 ENCOUNTER — Encounter (HOSPITAL_COMMUNITY)
Admission: EM | Disposition: A | Discharge status: Home / Self Care | Payer: Self-pay | Source: Home / Self Care | Attending: Family Medicine

## 2019-07-10 ENCOUNTER — Emergency Department (HOSPITAL_COMMUNITY): Payer: Medicare Other

## 2019-07-10 ENCOUNTER — Inpatient Hospital Stay (HOSPITAL_COMMUNITY): Payer: Medicare Other

## 2019-07-10 ENCOUNTER — Emergency Department (HOSPITAL_COMMUNITY): Payer: Medicare Other | Admitting: Certified Registered"

## 2019-07-10 DIAGNOSIS — M069 Rheumatoid arthritis, unspecified: Secondary | ICD-10-CM

## 2019-07-10 DIAGNOSIS — R296 Repeated falls: Secondary | ICD-10-CM | POA: Diagnosis present

## 2019-07-10 DIAGNOSIS — G9340 Encephalopathy, unspecified: Secondary | ICD-10-CM

## 2019-07-10 DIAGNOSIS — R6520 Severe sepsis without septic shock: Secondary | ICD-10-CM | POA: Diagnosis present

## 2019-07-10 DIAGNOSIS — Z20828 Contact with and (suspected) exposure to other viral communicable diseases: Secondary | ICD-10-CM | POA: Diagnosis present

## 2019-07-10 DIAGNOSIS — A419 Sepsis, unspecified organism: Secondary | ICD-10-CM | POA: Diagnosis not present

## 2019-07-10 DIAGNOSIS — N132 Hydronephrosis with renal and ureteral calculous obstruction: Secondary | ICD-10-CM | POA: Diagnosis not present

## 2019-07-10 DIAGNOSIS — F03A0 Unspecified dementia, mild, without behavioral disturbance, psychotic disturbance, mood disturbance, and anxiety: Secondary | ICD-10-CM | POA: Diagnosis present

## 2019-07-10 DIAGNOSIS — F0390 Unspecified dementia without behavioral disturbance: Secondary | ICD-10-CM | POA: Diagnosis present

## 2019-07-10 DIAGNOSIS — Z803 Family history of malignant neoplasm of breast: Secondary | ICD-10-CM | POA: Diagnosis not present

## 2019-07-10 DIAGNOSIS — D473 Essential (hemorrhagic) thrombocythemia: Secondary | ICD-10-CM | POA: Diagnosis not present

## 2019-07-10 DIAGNOSIS — D62 Acute posthemorrhagic anemia: Secondary | ICD-10-CM | POA: Diagnosis present

## 2019-07-10 DIAGNOSIS — G9341 Metabolic encephalopathy: Secondary | ICD-10-CM | POA: Diagnosis present

## 2019-07-10 DIAGNOSIS — Z7982 Long term (current) use of aspirin: Secondary | ICD-10-CM | POA: Diagnosis not present

## 2019-07-10 DIAGNOSIS — D72829 Elevated white blood cell count, unspecified: Secondary | ICD-10-CM | POA: Diagnosis not present

## 2019-07-10 DIAGNOSIS — N136 Pyonephrosis: Secondary | ICD-10-CM | POA: Diagnosis present

## 2019-07-10 DIAGNOSIS — M25569 Pain in unspecified knee: Secondary | ICD-10-CM | POA: Diagnosis present

## 2019-07-10 DIAGNOSIS — I10 Essential (primary) hypertension: Secondary | ICD-10-CM | POA: Diagnosis present

## 2019-07-10 DIAGNOSIS — N179 Acute kidney failure, unspecified: Secondary | ICD-10-CM | POA: Diagnosis present

## 2019-07-10 DIAGNOSIS — L89159 Pressure ulcer of sacral region, unspecified stage: Secondary | ICD-10-CM | POA: Diagnosis present

## 2019-07-10 DIAGNOSIS — K228 Other specified diseases of esophagus: Secondary | ICD-10-CM | POA: Diagnosis present

## 2019-07-10 HISTORY — PX: CYSTOSCOPY W/ URETERAL STENT PLACEMENT: SHX1429

## 2019-07-10 LAB — CBC WITH DIFFERENTIAL/PLATELET
Abs Immature Granulocytes: 0.09 10*3/uL — ABNORMAL HIGH (ref 0.00–0.07)
Basophils Absolute: 0 10*3/uL (ref 0.0–0.1)
Basophils Relative: 0 %
Eosinophils Absolute: 0 10*3/uL (ref 0.0–0.5)
Eosinophils Relative: 0 %
HCT: 45.8 % (ref 36.0–46.0)
Hemoglobin: 14.7 g/dL (ref 12.0–15.0)
Immature Granulocytes: 1 %
Lymphocytes Relative: 4 %
Lymphs Abs: 0.6 10*3/uL — ABNORMAL LOW (ref 0.7–4.0)
MCH: 32.1 pg (ref 26.0–34.0)
MCHC: 32.1 g/dL (ref 30.0–36.0)
MCV: 100 fL (ref 80.0–100.0)
Monocytes Absolute: 0.3 10*3/uL (ref 0.1–1.0)
Monocytes Relative: 2 %
Neutro Abs: 13.2 10*3/uL — ABNORMAL HIGH (ref 1.7–7.7)
Neutrophils Relative %: 93 %
Platelets: 217 10*3/uL (ref 150–400)
RBC: 4.58 MIL/uL (ref 3.87–5.11)
RDW: 14.5 % (ref 11.5–15.5)
WBC: 14.2 10*3/uL — ABNORMAL HIGH (ref 4.0–10.5)
nRBC: 0 % (ref 0.0–0.2)

## 2019-07-10 LAB — GLUCOSE, CAPILLARY
Glucose-Capillary: 110 mg/dL — ABNORMAL HIGH (ref 70–99)
Glucose-Capillary: 115 mg/dL — ABNORMAL HIGH (ref 70–99)
Glucose-Capillary: 116 mg/dL — ABNORMAL HIGH (ref 70–99)
Glucose-Capillary: 119 mg/dL — ABNORMAL HIGH (ref 70–99)
Glucose-Capillary: 124 mg/dL — ABNORMAL HIGH (ref 70–99)
Glucose-Capillary: 179 mg/dL — ABNORMAL HIGH (ref 70–99)

## 2019-07-10 LAB — HEMOGLOBIN A1C
Hgb A1c MFr Bld: 4.9 % (ref 4.8–5.6)
Mean Plasma Glucose: 93.93 mg/dL

## 2019-07-10 LAB — URINALYSIS, ROUTINE W REFLEX MICROSCOPIC
Bilirubin Urine: NEGATIVE
Glucose, UA: NEGATIVE mg/dL
Ketones, ur: 5 mg/dL — AB
Nitrite: NEGATIVE
Protein, ur: 100 mg/dL — AB
RBC / HPF: >50 RBC/hpf — ABNORMAL HIGH (ref 0–5)
Specific Gravity, Urine: 1.023 (ref 1.005–1.030)
pH: 5 (ref 5.0–8.0)

## 2019-07-10 LAB — COMPREHENSIVE METABOLIC PANEL
ALT: 15 U/L (ref 0–44)
AST: 26 U/L (ref 15–41)
Albumin: 3.2 g/dL — ABNORMAL LOW (ref 3.5–5.0)
Alkaline Phosphatase: 116 U/L (ref 38–126)
Anion gap: 15 (ref 5–15)
BUN: 20 mg/dL (ref 8–23)
CO2: 21 mmol/L — ABNORMAL LOW (ref 22–32)
Calcium: 8.6 mg/dL — ABNORMAL LOW (ref 8.9–10.3)
Chloride: 104 mmol/L (ref 98–111)
Creatinine, Ser: 1.51 mg/dL — ABNORMAL HIGH (ref 0.44–1.00)
GFR calc Af Amer: 38 mL/min — ABNORMAL LOW (ref >60)
GFR calc non Af Amer: 33 mL/min — ABNORMAL LOW (ref >60)
Glucose, Bld: 172 mg/dL — ABNORMAL HIGH (ref 70–99)
Potassium: 3.4 mmol/L — ABNORMAL LOW (ref 3.5–5.1)
Sodium: 140 mmol/L (ref 135–145)
Total Bilirubin: 2.1 mg/dL — ABNORMAL HIGH (ref 0.3–1.2)
Total Protein: 6.3 g/dL — ABNORMAL LOW (ref 6.5–8.1)

## 2019-07-10 LAB — TROPONIN I (HIGH SENSITIVITY)
Troponin I (High Sensitivity): 31 ng/L — ABNORMAL HIGH (ref <18)
Troponin I (High Sensitivity): 34 ng/L — ABNORMAL HIGH (ref <18)

## 2019-07-10 LAB — SARS CORONAVIRUS 2 BY RT PCR (HOSPITAL ORDER, PERFORMED IN ~~LOC~~ HOSPITAL LAB): SARS Coronavirus 2: NEGATIVE

## 2019-07-10 LAB — PHOSPHORUS: Phosphorus: 3.1 mg/dL (ref 2.5–4.6)

## 2019-07-10 LAB — MRSA PCR SCREENING: MRSA by PCR: NEGATIVE

## 2019-07-10 LAB — CBG MONITORING, ED: Glucose-Capillary: 171 mg/dL — ABNORMAL HIGH (ref 70–99)

## 2019-07-10 LAB — LACTIC ACID, PLASMA: Lactic Acid, Venous: 2.6 mmol/L (ref 0.5–1.9)

## 2019-07-10 LAB — MAGNESIUM: Magnesium: 1.7 mg/dL (ref 1.7–2.4)

## 2019-07-10 SURGERY — CYSTOSCOPY, WITH RETROGRADE PYELOGRAM AND URETERAL STENT INSERTION
Anesthesia: General | Site: Ureter | Laterality: Bilateral

## 2019-07-10 MED ORDER — ALBUMIN HUMAN 5 % IV SOLN
INTRAVENOUS | Status: AC
  Filled 2019-07-10: qty 250

## 2019-07-10 MED ORDER — PHENYLEPHRINE HCL-NACL 10-0.9 MG/250ML-% IV SOLN
INTRAVENOUS | Status: DC | PRN
  Administered 2019-07-10: 25 ug/min via INTRAVENOUS

## 2019-07-10 MED ORDER — ATENOLOL 25 MG PO TABS
25.0000 mg | ORAL_TABLET | Freq: Every day | ORAL | Status: DC
  Administered 2019-07-10 – 2019-07-17 (×8): 25 mg via ORAL
  Filled 2019-07-10 (×8): qty 1

## 2019-07-10 MED ORDER — SODIUM CHLORIDE 0.9 % IV SOLN
2.0000 g | INTRAVENOUS | Status: AC
  Administered 2019-07-10: 03:00:00 2 g via INTRAVENOUS
  Filled 2019-07-10: qty 2

## 2019-07-10 MED ORDER — FENTANYL CITRATE (PF) 100 MCG/2ML IJ SOLN
INTRAMUSCULAR | Status: DC | PRN
  Administered 2019-07-10: 25 ug via INTRAVENOUS

## 2019-07-10 MED ORDER — ACETAMINOPHEN 650 MG RE SUPP: 650.0000 mg | Freq: Four times a day (QID) | RECTAL | Status: DC | PRN

## 2019-07-10 MED ORDER — ALBUMIN HUMAN 5 % IV SOLN
12.5000 g | Freq: Once | INTRAVENOUS | Status: AC
  Administered 2019-07-10: 12.5 g via INTRAVENOUS

## 2019-07-10 MED ORDER — SUCCINYLCHOLINE CHLORIDE 200 MG/10ML IV SOSY
PREFILLED_SYRINGE | INTRAVENOUS | Status: DC | PRN
  Administered 2019-07-10: 120 mg via INTRAVENOUS

## 2019-07-10 MED ORDER — ONDANSETRON HCL 4 MG/2ML IJ SOLN: 4.0000 mg | Freq: Four times a day (QID) | INTRAMUSCULAR | Status: DC | PRN

## 2019-07-10 MED ORDER — ONDANSETRON HCL 4 MG PO TABS: 4.0000 mg | ORAL_TABLET | Freq: Four times a day (QID) | ORAL | Status: DC | PRN

## 2019-07-10 MED ORDER — ACETAMINOPHEN 325 MG RE SUPP
325.0000 mg | Freq: Once | RECTAL | Status: AC
  Administered 2019-07-10: 325 mg via RECTAL
  Filled 2019-07-10: qty 1

## 2019-07-10 MED ORDER — EPHEDRINE SULFATE 50 MG/ML IJ SOLN
INTRAMUSCULAR | Status: AC
  Administered 2019-07-10: 07:00:00
  Filled 2019-07-10: qty 1

## 2019-07-10 MED ORDER — SODIUM CHLORIDE 0.9 % IR SOLN
Status: DC | PRN
  Administered 2019-07-10: 3000 mL

## 2019-07-10 MED ORDER — ACETAMINOPHEN 650 MG RE SUPP
650.0000 mg | Freq: Once | RECTAL | Status: AC
  Administered 2019-07-10: 650 mg via RECTAL
  Filled 2019-07-10: qty 1

## 2019-07-10 MED ORDER — ACETAMINOPHEN 325 MG PO TABS
650.0000 mg | ORAL_TABLET | Freq: Four times a day (QID) | ORAL | Status: DC | PRN
  Administered 2019-07-15 – 2019-07-16 (×2): 650 mg via ORAL
  Filled 2019-07-10 (×2): qty 2

## 2019-07-10 MED ORDER — INSULIN ASPART 100 UNIT/ML ~~LOC~~ SOLN
0.0000 [IU] | SUBCUTANEOUS | Status: DC
  Administered 2019-07-10 (×3): 2 [IU] via SUBCUTANEOUS
  Administered 2019-07-14 – 2019-07-15 (×2): 1 [IU] via SUBCUTANEOUS
  Administered 2019-07-15: 08:00:00 2 [IU] via SUBCUTANEOUS
  Administered 2019-07-16: 18:00:00 1 [IU] via SUBCUTANEOUS
  Filled 2019-07-10: qty 0.09

## 2019-07-10 MED ORDER — PROPOFOL 10 MG/ML IV BOLUS
INTRAVENOUS | Status: DC | PRN
  Administered 2019-07-10: 10 mg via INTRAVENOUS
  Administered 2019-07-10: 80 mg via INTRAVENOUS

## 2019-07-10 MED ORDER — SODIUM CHLORIDE 0.9 % IV SOLN
INTRAVENOUS | Status: DC
  Administered 2019-07-10 – 2019-07-12 (×4): via INTRAVENOUS
  Administered 2019-07-12: 100 mL/h via INTRAVENOUS
  Administered 2019-07-13: 05:00:00 via INTRAVENOUS

## 2019-07-10 MED ORDER — LIDOCAINE 2% (20 MG/ML) 5 ML SYRINGE
INTRAMUSCULAR | Status: DC | PRN
  Administered 2019-07-10: 80 mg via INTRAVENOUS

## 2019-07-10 MED ORDER — CHLORHEXIDINE GLUCONATE CLOTH 2 % EX PADS
6.0000 | MEDICATED_PAD | Freq: Every day | CUTANEOUS | Status: DC
  Administered 2019-07-10: 07:00:00 6 via TOPICAL

## 2019-07-10 MED ORDER — ACETAMINOPHEN 500 MG PO TABS
1000.0000 mg | ORAL_TABLET | Freq: Once | ORAL | Status: DC
  Filled 2019-07-10: qty 2

## 2019-07-10 MED ORDER — FENTANYL CITRATE (PF) 100 MCG/2ML IJ SOLN
INTRAMUSCULAR | Status: AC
  Filled 2019-07-10: qty 2

## 2019-07-10 MED ORDER — SODIUM CHLORIDE 0.9 % IV BOLUS
1000.0000 mL | Freq: Once | INTRAVENOUS | Status: AC
  Administered 2019-07-10: 1000 mL via INTRAVENOUS

## 2019-07-10 MED ORDER — ORAL CARE MOUTH RINSE
15.0000 mL | Freq: Two times a day (BID) | OROMUCOSAL | Status: DC
  Administered 2019-07-10 – 2019-07-17 (×13): 15 mL via OROMUCOSAL

## 2019-07-10 MED ORDER — PAROXETINE HCL ER 25 MG PO TB24
25.0000 mg | ORAL_TABLET | Freq: Every day | ORAL | Status: DC
  Administered 2019-07-10 – 2019-07-17 (×8): 25 mg via ORAL
  Filled 2019-07-10 (×8): qty 1

## 2019-07-10 MED ORDER — STERILE WATER FOR IRRIGATION IR SOLN
Status: DC | PRN
  Administered 2019-07-10: 10 mL

## 2019-07-10 MED ORDER — FENTANYL CITRATE (PF) 100 MCG/2ML IJ SOLN: 25.0000 ug | INTRAMUSCULAR | Status: DC | PRN

## 2019-07-10 MED ORDER — SODIUM CHLORIDE 0.9 % IV SOLN
2.0000 g | Freq: Two times a day (BID) | INTRAVENOUS | Status: DC
  Administered 2019-07-10 – 2019-07-14 (×8): 2 g via INTRAVENOUS
  Filled 2019-07-10 (×11): qty 2

## 2019-07-10 MED ORDER — MAGNESIUM SULFATE 2 GM/50ML IV SOLN
2.0000 g | Freq: Once | INTRAVENOUS | Status: AC
  Administered 2019-07-10: 2 g via INTRAVENOUS
  Filled 2019-07-10: qty 50

## 2019-07-10 MED ORDER — LACTATED RINGERS IV SOLN
INTRAVENOUS | Status: DC | PRN
  Administered 2019-07-10: 05:00:00 via INTRAVENOUS

## 2019-07-10 MED ORDER — EPHEDRINE SULFATE 50 MG/ML IJ SOLN
5.0000 mg | Freq: Once | INTRAMUSCULAR | Status: AC
  Administered 2019-07-10: 5 mg via INTRAVENOUS

## 2019-07-10 MED ORDER — BUPROPION HCL ER (SR) 100 MG PO TB12
100.0000 mg | ORAL_TABLET | Freq: Every day | ORAL | Status: DC
  Administered 2019-07-10 – 2019-07-17 (×8): 100 mg via ORAL
  Filled 2019-07-10 (×8): qty 1

## 2019-07-10 MED ORDER — PROPOFOL 10 MG/ML IV BOLUS
INTRAVENOUS | Status: AC
  Filled 2019-07-10: qty 20

## 2019-07-10 MED ORDER — ARIPIPRAZOLE 2 MG PO TABS
2.0000 mg | ORAL_TABLET | Freq: Every day | ORAL | Status: DC
  Administered 2019-07-10 – 2019-07-17 (×8): 2 mg via ORAL
  Filled 2019-07-10 (×8): qty 1

## 2019-07-10 MED ORDER — 0.9 % SODIUM CHLORIDE (POUR BTL) OPTIME
TOPICAL | Status: DC | PRN
  Administered 2019-07-10: 1000 mL

## 2019-07-10 MED ORDER — IOHEXOL 300 MG/ML  SOLN
INTRAMUSCULAR | Status: DC | PRN
  Administered 2019-07-10: 25 mL via URETHRAL

## 2019-07-10 MED ORDER — ENOXAPARIN SODIUM 40 MG/0.4ML ~~LOC~~ SOLN
40.0000 mg | SUBCUTANEOUS | Status: DC
  Administered 2019-07-10 – 2019-07-16 (×6): 40 mg via SUBCUTANEOUS
  Filled 2019-07-10 (×6): qty 0.4

## 2019-07-10 MED ORDER — PHENYLEPHRINE 40 MCG/ML (10ML) SYRINGE FOR IV PUSH (FOR BLOOD PRESSURE SUPPORT)
PREFILLED_SYRINGE | INTRAVENOUS | Status: DC | PRN
  Administered 2019-07-10: 40 ug via INTRAVENOUS
  Administered 2019-07-10: 120 ug via INTRAVENOUS
  Administered 2019-07-10: 80 ug via INTRAVENOUS
  Administered 2019-07-10: 120 ug via INTRAVENOUS

## 2019-07-10 MED ORDER — VANCOMYCIN HCL IN DEXTROSE 1-5 GM/200ML-% IV SOLN
1000.0000 mg | Freq: Once | INTRAVENOUS | Status: DC
  Administered 2019-07-10: 03:00:00 1000 mg via INTRAVENOUS
  Filled 2019-07-10: qty 200

## 2019-07-10 SURGICAL SUPPLY — 20 items
BAG URO CATCHER STRL LF (MISCELLANEOUS) ×4 IMPLANT
CATH INTERMIT  6FR 70CM (CATHETERS) ×4 IMPLANT
CLOTH BEACON ORANGE TIMEOUT ST (SAFETY) ×4 IMPLANT
GLOVE BIO SURGEON STRL SZ7.5 (GLOVE) ×4 IMPLANT
GLOVE BIOGEL PI IND STRL 7.5 (GLOVE) ×2 IMPLANT
GLOVE BIOGEL PI IND STRL 8 (GLOVE) ×2 IMPLANT
GLOVE BIOGEL PI INDICATOR 7.5 (GLOVE) ×2
GLOVE BIOGEL PI INDICATOR 8 (GLOVE) ×2
GLOVE ECLIPSE 8.0 STRL XLNG CF (GLOVE) ×4 IMPLANT
GOWN STRL REUS W/TWL LRG LVL3 (GOWN DISPOSABLE) ×8 IMPLANT
GOWN STRL REUS W/TWL XL LVL3 (GOWN DISPOSABLE) ×8 IMPLANT
GUIDEWIRE STR DUAL SENSOR (WIRE) ×4 IMPLANT
KIT TURNOVER KIT A (KITS) IMPLANT
MANIFOLD NEPTUNE II (INSTRUMENTS) ×4 IMPLANT
PACK CYSTO (CUSTOM PROCEDURE TRAY) ×4 IMPLANT
STENT URET 6FRX26 CONTOUR (STENTS) ×8 IMPLANT
TRAY FOLEY MTR SLVR 14FR STAT (SET/KITS/TRAYS/PACK) ×4 IMPLANT
TUBING CONNECTING 10 (TUBING) ×3 IMPLANT
TUBING CONNECTING 10' (TUBING) ×1
TUBING UROLOGY SET (TUBING) IMPLANT

## 2019-07-10 NOTE — Anesthesia Procedure Notes (Signed)
Procedure Name: Intubation Date/Time: 07/10/2019 5:07 AM Performed by: Cynda Familia, CRNA Pre-anesthesia Checklist: Patient identified, Emergency Drugs available, Suction available and Patient being monitored Patient Re-evaluated:Patient Re-evaluated prior to induction Oxygen Delivery Method: Circle System Utilized Preoxygenation: Pre-oxygenation with 100% oxygen Induction Type: IV induction, Rapid sequence and Cricoid Pressure applied Ventilation: Mask ventilation without difficulty Laryngoscope Size: Miller and 2 Grade View: Grade I Tube type: Oral Number of attempts: 1 Airway Equipment and Method: Stylet Placement Confirmation: ETT inserted through vocal cords under direct vision,  positive ETCO2 and breath sounds checked- equal and bilateral Secured at: 22 cm Tube secured with: Tape Dental Injury: Teeth and Oropharynx as per pre-operative assessment  Comments: Smooth RSI Fransisco Beau-- intubation AM CRNA atraumatic-- teeth and mouth as preop-- chipping all front teeth --unchanged with laryngoscopy-- bilat BS Fransisco Beau

## 2019-07-10 NOTE — ED Notes (Signed)
x2 Attempts to obtain blood work

## 2019-07-10 NOTE — ED Notes (Signed)
Attempted lab draw x2 Was only able to obtain 1 light green tube Tube sent to lab IV team called per RN

## 2019-07-10 NOTE — Progress Notes (Signed)
A consult was received from an ED physician for cefepime/vancomycin per pharmacy dosing.  The patient's profile has been reviewed for ht/wt/allergies/indication/available labs.   A one time order has been placed for Cefepime 2 gm and Vancomycin 1 Gm.  Further antibiotics/pharmacy consults should be ordered by admitting physician if indicated.                       Thank you, Dorrene German 07/10/2019  1:52 AM

## 2019-07-10 NOTE — ED Notes (Addendum)
Pt Oxygen saturation dropped to  And maintained 85-88% after laying flat on RA Pt placed on 2L Nasal canula

## 2019-07-10 NOTE — H&P (Signed)
History and Physical    Danielle Dixon [id number] DOB: 1941-08-08 DOA: 07/09/2019  PCP: Jonathon Jordan, MD  Patient coming from: [facility]  I have personally briefly reviewed patient's old medical records in [place]  Chief Complaint: "Not acting right, suspect UTI"  HPI: Danielle Dixon is a 78 y.o. female with medical history significant of dementia.  Baseline mental status isnt entirely clear but apparently at baseline she can hold a conversation, ambulate with walker.  Patient has been unable to walk today which was abnormal for her.  At approximately 20:15, staff walked in the room to give the patient her evening medications and found her on the floor side of her chair.  The assessed her for wounds and placed her back in her chair.  Staff then checked on her again, found her on the floor in front of chair.  Was "twitching" ([place]), but eyes open and intermittently able to talk to staff.  EMS called.   ED Course: Tm 102.9.  UA suggestive of UTI, CXR neg, COVID neg, CT abd/pelvis shows 1cm L UPJ stone with obstruction and severe L hydronephrosis.   Review of Systems: Unable to perform due to AMS.  Past Medical History:  Diagnosis Date   Dementia ([address])    Dyspnea 06/25/2013   Leukocytosis    Leukocytosis, unspecified 06/25/2013   Leukocytosis, unspecified 06/25/2013   Psoriasis    Unspecified vitamin D deficiency 06/25/2013    Past Surgical History:  Procedure Laterality Date   APPENDECTOMY     knee replacment Left    TOE SURGERY       reports that she has never smoked. She does not have any smokeless tobacco history on file. She reports that she does not drink alcohol or use drugs.  No Known Allergies  Family History  Problem Relation Age of Onset   Cancer Mother        breast cancer   Cancer Father        lung     Prior to Admission medications   Medication Sig Start Date End Date Taking? Authorizing Provider  albuterol  (VENTOLIN HFA) 108 (90 Base) MCG/ACT inhaler Inhale 2 puffs into the lungs 4 (four) times daily.   Yes [provider]  ARIPiprazole (ABILIFY) 2 MG tablet Take 2 mg by mouth daily.   Yes [provider]  aspirin 81 MG tablet Take 81 mg by mouth daily.   Yes [provider]  atenolol (TENORMIN) 25 MG tablet Take 25 mg by mouth daily.   Yes [provider]  buPROPion (WELLBUTRIN SR) 100 MG 12 hr tablet Take 100 mg by mouth daily.   Yes [provider]  cholecalciferol (VITAMIN D) 1000 UNITS tablet Take 1 tablet (1,000 Units total) by mouth daily. 06/25/13  Yes Gorsuch, Ni, MD  donepezil (ARICEPT) 10 MG tablet Take 10 mg by mouth at bedtime. Unsure of dose    Yes [provider]  folic acid (FOLVITE) 1 MG tablet Take 1 mg by mouth daily.   Yes [provider]  methotrexate (RHEUMATREX) 2.5 MG tablet Take 17.5 mg by mouth every Wednesday.    Yes [provider]  PARoxetine (PAXIL-CR) 25 MG 24 hr tablet Take 25 mg by mouth daily.   Yes [provider]  simvastatin (ZOCOR) 10 MG tablet Take 10 mg by mouth at bedtime. Unsure of dose   Yes [provider]  vitamin B-12 (CYANOCOBALAMIN) 500 MCG tablet Take 500 mcg by mouth daily.  Yes [provider]  zafirlukast (ACCOLATE) 20 MG tablet Take 20 mg by mouth 2 (two) times daily.   Yes [provider]    Physical Exam: Vitals:   07/10/19 0334 07/10/19 0336 07/10/19 0345 07/10/19 0400  BP: (!) 170/63  (!) 161/58 (!) 145/77  Pulse: 66  64 71  Resp:    (!) 23  Temp:  (!) 102.9 F (39.4 C)    TempSrc:  Rectal    SpO2: 93%  94% 92%    Constitutional: Non verbal, will make eye contact when you call her name, not obeying commands Eyes: PERRL, lids and conjunctivae normal ENMT: Mucous membranes are moist. Posterior pharynx clear of any exudate or lesions.Normal dentition.  Neck: normal, supple, no masses, no thyromegaly Respiratory: clear to  auscultation bilaterally, no wheezing, no crackles. Normal respiratory effort. No accessory muscle use.  Cardiovascular: Regular rate and rhythm, no murmurs / rubs / gallops. No extremity edema. 2+ pedal pulses. No carotid bruits.  Abdomen: no tenderness, no masses palpated. No hepatosplenomegaly. Bowel sounds positive.  Musculoskeletal: TTP right knee. Skin: no rashes, lesions, ulcers. No induration Neurologic: MAE, not obeying commands, non-verbal, makes eye contact to name calling Psychiatric: non-verbal as above   Labs on Admission: I have personally reviewed following labs and imaging studies  CBC: Recent Labs  Lab 07/10/19 0227  WBC 14.2*  NEUTROABS 13.2*  HGB 14.7  HCT 45.8  MCV 100.0  PLT [id number]   Basic Metabolic Panel: Recent Labs  Lab 07/10/19 0227  NA 140  K 3.4*  CL 104  CO2 21*  GLUCOSE 172*  BUN 20  CREATININE 1.51*  CALCIUM 8.6*  MG 1.7  PHOS 3.1   GFR: CrCl cannot be calculated (Unknown ideal weight.). Liver Function Tests: Recent Labs  Lab 07/10/19 0227  AST 26  ALT 15  ALKPHOS 116  BILITOT 2.1*  PROT 6.3*  ALBUMIN 3.2*   No results for input(s): LIPASE, AMYLASE in the last 168 hours. No results for input(s): AMMONIA in the last 168 hours. Coagulation Profile: No results for input(s): INR, PROTIME in the last 168 hours. Cardiac Enzymes: No results for input(s): CKTOTAL, CKMB, CKMBINDEX, TROPONINI in the last 168 hours. BNP (last 3 results) No results for input(s): PROBNP in the last 8760 hours. HbA1C: No results for input(s): HGBA1C in the last 72 hours. CBG: Recent Labs  Lab 07/10/19 0421  GLUCAP 171*   Lipid Profile: No results for input(s): CHOL, HDL, LDLCALC, TRIG, CHOLHDL, LDLDIRECT in the last 72 hours. Thyroid Function Tests: No results for input(s): TSH, T4TOTAL, FREET4, T3FREE, THYROIDAB in the last 72 hours. Anemia Panel: No results for input(s): VITAMINB12, FOLATE, FERRITIN, TIBC, IRON, RETICCTPCT in the last 72  hours. Urine analysis:    Component Value Date/Time   COLORURINE AMBER (A) 07/09/2019 2225   APPEARANCEUR HAZY (A) 07/09/2019 2225   LABSPEC 1.023 07/09/2019 2225   LABSPEC 1.030 06/25/2013 1206   PHURINE 5.0 07/09/2019 2225   GLUCOSEU NEGATIVE 07/09/2019 2225   GLUCOSEU Negative 06/25/2013 1206   HGBUR MODERATE (A) 07/09/2019 [place] 07/09/2019 2225   BILIRUBINUR Negative 06/25/2013 1206   KETONESUR 5 (A) 07/09/2019 2225   PROTEINUR 100 (A) 07/09/2019 2225   UROBILINOGEN 0.2 06/25/2013 1206   NITRITE NEGATIVE 07/09/2019 2225   LEUKOCYTESUR LARGE (A) 07/09/2019 2225   LEUKOCYTESUR Negative 06/25/2013 1206    Radiological Exams on Admission: Dg Chest 1 View  Result Date: 07/10/2019 CLINICAL DATA:  Fevers and hypoxia EXAM: CHEST  1 VIEW COMPARISON:  06/25/2013 FINDINGS: Cardiac shadow is stable. Aortic calcifications are seen. The lungs are well aerated bilaterally. No focal infiltrate or sizable effusion is noted. No acute bony abnormality is seen. IMPRESSION: No acute abnormality noted. Electronically Signed   By: Inez Catalina M.D.   On: 07/10/2019 02:14   Dg Knee 2 Views Right  Result Date: 07/09/2019 CLINICAL DATA:  78 year old female with right lower extremity pain. EXAM: RIGHT KNEE - 1-2 VIEW; RIGHT ANKLE - COMPLETE 3+ VIEW; RIGHT TIBIA AND FIBULA - 2 VIEW COMPARISON:  None. FINDINGS: There is no acute fracture or dislocation. The bones are osteopenic. 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 Fixation screw through the proximal first metatarsal noted. The ankle mortise is intact. The soft tissues are unremarkable. No joint effusion. IMPRESSION: 1. No acute fracture or dislocation. 2. 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 Electronically Signed   By: Anner Crete M.D.   On: 07/09/2019 23:04   Dg Tibia/fibula Right  Result Date: 07/09/2019 CLINICAL DATA:  78 year old  female with right lower extremity pain. EXAM: RIGHT KNEE - 1-2 VIEW; RIGHT ANKLE - COMPLETE 3+ VIEW; RIGHT TIBIA AND FIBULA - 2 VIEW COMPARISON:  None. FINDINGS: There is no acute fracture or dislocation. The bones are osteopenic. 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 Fixation screw through the proximal first metatarsal noted. The ankle mortise is intact. The soft tissues are unremarkable. No joint effusion. IMPRESSION: 1. No acute fracture or dislocation. 2. 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 Electronically Signed   By: Anner Crete M.D.   On: 07/09/2019 23:04   Dg Ankle Complete Right  Result Date: 07/09/2019 CLINICAL DATA:  78 year old female with right lower extremity pain. EXAM: RIGHT KNEE - 1-2 VIEW; RIGHT ANKLE - COMPLETE 3+ VIEW; RIGHT TIBIA AND FIBULA - 2 VIEW COMPARISON:  None. FINDINGS: There is no acute fracture or dislocation. The bones are osteopenic. 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 Fixation screw through the proximal first metatarsal noted. The ankle mortise is intact. The soft tissues are unremarkable. No joint effusion. IMPRESSION: 1. No acute fracture or dislocation. 2. 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 Electronically Signed   By: Anner Crete M.D.   On: 07/09/2019 23:04   Ct Head Wo Contrast  Result Date: 07/09/2019 CLINICAL DATA:  78 year old female with ataxia. EXAM: CT HEAD WITHOUT CONTRAST CT CERVICAL SPINE WITHOUT CONTRAST TECHNIQUE: Multidetector CT imaging of the head and cervical spine was performed following the standard protocol without intravenous contrast. Multiplanar CT image reconstructions of the cervical spine were also generated. COMPARISON:  Head CT dated 04/23/2016. FINDINGS: CT HEAD FINDINGS Brain: Moderate age-related atrophy and chronic microvascular ischemic  changes. Focal area of white matter hypodensity in the right frontal convexity (series 3, image 25), likely chronic. 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 There is no acute intracranial hemorrhage. No mass effect or midline shift. No extra-axial fluid collection. Vascular: No hyperdense vessel or unexpected calcification. Skull: Normal. Negative for fracture or focal lesion. Sinuses/Orbits: There is partial opacification of the sphenoid sinuses with air-fluid level. The mastoid air cells are clear. Other: None CT CERVICAL SPINE FINDINGS Alignment: No acute subluxation. Skull base and vertebrae: No acute fracture. Osteopenia. Soft tissues and spinal canal: No prevertebral fluid or swelling. No visible canal hematoma. Disc levels: Multilevel degenerative  changes with endplate irregularity and disc space narrowing. Upper chest: Mild emphysema. Other: Bilateral carotid bulb calcified plaques. IMPRESSION: 1. No acute intracranial hemorrhage. 2. Age-related atrophy and chronic microvascular ischemic changes. Focal area of white matter hypodensity in the right frontal convexity, likely chronic. MRI may provide better evaluation if there is high clinical concern for acute infarct. 3. No acute/traumatic cervical spine pathology. Electronically Signed   By: Anner Crete M.D.   On: 07/09/2019 23:37   Ct Cervical Spine Wo Contrast  Result Date: 07/09/2019 CLINICAL DATA:  78 year old female with ataxia. EXAM: CT HEAD WITHOUT CONTRAST CT CERVICAL SPINE WITHOUT CONTRAST TECHNIQUE: Multidetector CT imaging of the head and cervical spine was performed following the standard protocol without intravenous contrast. Multiplanar CT image reconstructions of the cervical spine were also generated. COMPARISON:  Head CT dated 04/23/2016. FINDINGS: CT HEAD FINDINGS Brain: Moderate age-related atrophy and chronic microvascular ischemic changes. Focal area of white matter hypodensity in the right frontal convexity  (series 3, image 25), likely chronic. 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 There is no acute intracranial hemorrhage. No mass effect or midline shift. No extra-axial fluid collection. Vascular: No hyperdense vessel or unexpected calcification. Skull: Normal. Negative for fracture or focal lesion. Sinuses/Orbits: There is partial opacification of the sphenoid sinuses with air-fluid level. The mastoid air cells are clear. Other: None CT CERVICAL SPINE FINDINGS Alignment: No acute subluxation. Skull base and vertebrae: No acute fracture. Osteopenia. Soft tissues and spinal canal: No prevertebral fluid or swelling. No visible canal hematoma. Disc levels: Multilevel degenerative changes with endplate irregularity and disc space narrowing. Upper chest: Mild emphysema. Other: Bilateral carotid bulb calcified plaques. IMPRESSION: 1. No acute intracranial hemorrhage. 2. Age-related atrophy and chronic microvascular ischemic changes. Focal area of white matter hypodensity in the right frontal convexity, likely chronic. MRI may provide better evaluation if there is high clinical concern for acute infarct. 3. No acute/traumatic cervical spine pathology. Electronically Signed   By: Anner Crete M.D.   On: 07/09/2019 23:37   Ct Renal Stone Study  Result Date: 07/10/2019 CLINICAL DATA:  78 year old female with flank pain. EXAM: CT ABDOMEN AND PELVIS WITHOUT CONTRAST TECHNIQUE: Multidetector CT imaging of the abdomen and pelvis was performed following the standard protocol without IV contrast. COMPARISON:  None. FINDINGS: Evaluation of this exam is limited in the absence of intravenous contrast. Evaluation is also limited due to respiratory motion artifact. Lower chest: Bibasilar streaky densities may represent atelectasis although infiltrate is not excluded. Clinical correlation is recommended. There is coronary vascular calcification. No intra-abdominal free air or free fluid. Hepatobiliary: The liver  is grossly unremarkable. No intrahepatic biliary ductal dilatation. Faint subcentimeter hypodense focus in the left lobe of the liver is not characterized. No calcified gallstone. Pancreas: The pancreas is grossly unremarkable. Spleen: Normal in size without focal abnormality. Adrenals/Urinary Tract: The adrenal glands are unremarkable there are multiple bilateral renal calculi. There is a 1 cm stone in the left ureteropelvic junction with associated left-sided hydronephrosis. There is a large cystic structure in the mid to upper pole of the left kidney and renal hilum measuring up to approximately 8 cm in length. This may represent several renal and parapelvic cysts or a severe hydronephrosis. Evaluation is very limited due to respiratory motion artifact. This can be better evaluated with ultrasound or CT with IV contrast and excretory phase imaging. Several additional nonobstructing stones noted in the left kidney. 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 There is no hydronephrosis on the right. The visualized ureters appear unremarkable. The bilateral bladder diverticula noted measuring approximately 2.2 cm on the right. There is a 2.5 x 1.2 cm lesion along the left bladder wall likely a bladder diverticulum. High attenuating content within this structure may represent proteinaceous or hemorrhagic debris. Stomach/Bowel: There is sigmoid diverticulosis without active inflammatory changes. There is no bowel obstruction or active inflammation. The appendix is not visualized with certainty. No inflammatory changes identified in the right lower quadrant. Vascular/Lymphatic: Moderate aortoiliac atherosclerotic disease. The IVC is unremarkable. No portal venous gas. There is no adenopathy. Reproductive: Partially calcified right fundal fibroid measures up to 5 cm. Other: None Musculoskeletal: Osteopenia with degenerative changes of the spine. No  acute osseous pathology. IMPRESSION: 1. A 1 cm left UPJ stone with associated left-sided hydronephrosis. Large left renal cyst versus moderate/severe hydronephrosis. Evaluation is very limited due to respiratory motion artifact and in the absence of intravenous contrast. 2. 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 No hydronephrosis on the right. 3. Sigmoid diverticulosis. No bowel obstruction or active inflammation. Aortic Atherosclerosis (ICD10-I70.0). Electronically Signed   By: Anner Crete M.D.   On: 07/10/2019 03:17    EKG: Independently reviewed.  Assessment/Plan Principal Problem:   Pyohydronephrosis Active Problems:   Sepsis ([place])   Mild dementia ([place])   Acute metabolic encephalopathy   Rheumatoid arthritis ([place])    1. Sepsis believed to be secondary to pyohydronephrosis - 1. Regarding source: 1. UA shows UTI 2. CT shows obstructing 1cm L stone with severe hydronephrosis 3. CXR clear, CT does show streaks at bases of lungs, actelectasis vs PNA 4. Covid is negative 2. IVF: 1L bolus in ED and 125 cc/hr NS 3. Got cefepime / vanc in ED, will leave on cefepime for now 4. Urology taking patient to OR for ureteral stents 5. Tylenol PRN fever 2. Acute metabolic encephalopathy - 1. Worsening mental status while in ED, most likely delirium secondary to #1 above 2. CT head was neg for acute abnormality 3. Mild dementia at baseline - 1. Patient resides in memory care unit 2. Usually able to hold conversation though frequently confused it sounds like at baseline 4. RA - 1. Holding MTX secondary to sepsis  DVT prophylaxis: Lovenox at 1400 today Code Status: Full Family Communication: No family at bedside Disposition Plan: back to SNF after admit Consults called: Urology Dr. Gloriann Loan Admission status: Admit to inpatient  Severity of Illness: The appropriate patient status for this patient is INPATIENT.  Inpatient status is judged to be reasonable and necessary in order to provide the required intensity of service to ensure the patient's safety. The patient's presenting symptoms, physical exam findings, and initial radiographic and laboratory data in the context of their chronic comorbidities is felt to place them at high risk for further clinical deterioration. Furthermore, it is not anticipated that the patient will be medically stable for discharge from the hospital within 2 midnights of admission. The following factors support the patient status of inpatient.   IP status for sepsis secondary to pyohydronephrosis, needs emergent ureteral stent placement, IV abx.  Also has encephalopathy as well.  * I certify that at the point of admission it is my clinical judgment that the patient will require inpatient hospital care spanning beyond 2 midnights from the point of admission due to high intensity of service, high risk for further deterioration and  high frequency of surveillance required.*    Zygmund Passero M. DO [facility]  How to contact the [facility] Attending or Consulting provider [place] or covering provider during after hours [place], for this patient?  1. Check the care team in [facility] and look for a) attending/consulting [facility] provider listed and b) the [facility] team listed 2. Log into [email or web address]  Amion Physician Scheduling and messaging for groups and whole hospitals  On call and physician scheduling software for group practices, residents, hospitalists and other medical providers for call, clinic, rotation and shift schedules. On[employer] is a hospital-wide system for scheduling doctors and paging doctors on call. EasyPlot is for scientific plotting and data analysis.  [email or web address]  and use [place]'s universal password to access. If you do not have the password, please contact the hospital operator.  3. Locate the [facility] provider you are looking for under [facility] and page to  a number that you can be directly reached. 4. If you still have difficulty reaching the provider, please page the [facility] (Director on Call) for the Hospitalists listed on amion for assistance.  07/10/2019, 4:36 AM

## 2019-07-10 NOTE — Anesthesia Preprocedure Evaluation (Addendum)
Anesthesia Evaluation  Patient identified by MRN, date of birth, ID band Patient awake    Reviewed: Allergy & Precautions, NPO status , Patient's Chart, lab work & pertinent test results  History of Anesthesia Complications Negative for: history of anesthetic complications  Airway Mallampati: III  TM Distance: >3 FB     Dental  (+) Dental Advisory Given, Chipped   Pulmonary neg pulmonary ROS,    breath sounds clear to auscultation       Cardiovascular  Rhythm:Regular Rate:Bradycardia - Systolic murmurs  EKG - QTc 613, SB, inferior q waves    Neuro/Psych PSYCHIATRIC DISORDERS Dementia  Noted at facility with rhythmic jerking per ED notes, no history of seizure. CT Head negative for acute stroke     GI/Hepatic negative GI ROS, Neg liver ROS,   Endo/Other  negative endocrine ROS  Renal/GU Renal InsufficiencyRenal disease (Kidney stones)     Musculoskeletal  (+) Arthritis , Rheumatoid disorders,    Abdominal   Peds  Hematology negative hematology ROS (+)   Anesthesia Other Findings   Reproductive/Obstetrics                           Anesthesia Physical Anesthesia Plan  ASA: III and emergent  Anesthesia Plan: General   Post-op Pain Management:    Induction: Intravenous  PONV Risk Score and Plan: 3 and Treatment may vary due to age or medical condition and Propofol infusion  Airway Management Planned: Oral ETT  Additional Equipment: None  Intra-op Plan:   Post-operative Plan: Extubation in OR  Informed Consent: I have reviewed the patients History and Physical, chart, labs and discussed the procedure including the risks, benefits and alternatives for the proposed anesthesia with the patient or authorized representative who has indicated his/her understanding and acceptance.     History available from chart only, Only emergency history available and Dental advisory given  Plan  Discussed with: CRNA and Anesthesiologist  Anesthesia Plan Comments:       Anesthesia Quick Evaluation

## 2019-07-10 NOTE — ED Notes (Signed)
Contacted Lab to come try to obtain labwork.

## 2019-07-10 NOTE — ED Notes (Signed)
Patient transported to CT 

## 2019-07-10 NOTE — Op Note (Signed)
Operative Note  Preoperative diagnosis:  1.  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  Cystoscopy with bilateral retrograde pyelogram and bilateral ureteral stent placement  Surgeon: Link Snuffer, MD  Assistants: None  Anesthesia: General  Complications: None immediate  EBL: Minimal  Specimens: 1.  Urine culture  Drains/Catheters: 1.  6 X 26 double-J ureteral stent bilaterally  Intraoperative findings: 1.  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  Left retrograde pyelogram revealed a filling defect at the level of the stone with upstream hydroureteronephrosis.  There was Efflux of pus from the left ureter after decompression 3.  Right retrograde pyelogram revealed no evidence of hydronephrosis.  There was a filling defect in the renal pelvis consistent with known stone  Indication: 78 year old female presented with a left ureteral calculus and right staghorn calculus with signs of sepsis secondary to urinary tract infection.  Decision was made to proceed emergently to the operating room for bilateral ureteral stent placement.  Description of procedure:  The patient was identified and consent was obtained.  The patient was taken to the operating room and placed in the supine position.  The patient was placed under general anesthesia.  Perioperative antibiotics were administered.  The patient was placed in dorsal lithotomy.  Patient was prepped and draped in a standard sterile fashion and a timeout was performed.  A 21 French rigid cystoscope was advanced into the urethra and into the bladder.  The left distal most portion of the ureter was cannulated with an open-ended ureteral catheter.  Retrograde pyelogram was performed with the findings noted above.  A sensor wire was then advanced up to the kidney under fluoroscopic  guidance.  A 6 X 26 double-J ureteral stent was advanced up to the kidney under fluoroscopic guidance.  The wire was withdrawn and fluoroscopy confirmed good proximal placement and direct visualization confirmed a good coil within the bladder.   The right distal most portion of the ureter was cannulated with an open-ended ureteral catheter.  Retrograde pyelogram was performed with the findings noted above.  A sensor wire was then advanced up to the kidney under fluoroscopic guidance.  A 6 X 26 double-J ureteral stent was advanced up to the kidney under fluoroscopic guidance.  The wire was withdrawn and fluoroscopy confirmed good proximal placement and direct visualization confirmed a good coil within the bladder.   The scope was withdrawn and a Foley catheter was placed..  This concluded the operation.  Patient tolerated procedure well and was stable postoperatively.  Plan: Appreciate hospitalist assistance with management of the patient.  She will go to stepdown unit as long as clinically stable to do so.  She will be continued on antibiotics as we await culture results.  Ultimately, she will need definitive management of her stones likely with left ureteroscopy and right PCNL.

## 2019-07-10 NOTE — Transfer of Care (Signed)
Immediate Anesthesia Transfer of Care Note  Patient: Danielle Dixon  Procedure(s) Performed: CYSTOSCOPY WITH RETROGRADE PYELOGRAM AND BILATERAL URETERAL STENT PLACEMENT (Bilateral Ureter)  Patient Location: PACU  Anesthesia Type:General  Level of Consciousness: sedated  Airway & Oxygen Therapy: Patient Spontanous Breathing and Patient connected to face mask oxygen  Post-op Assessment: Report given to RN and Post -op Vital signs reviewed and stable  Post vital signs: Reviewed and stable  Last Vitals:  Vitals Value Taken Time  BP 82/42 07/10/19 0553  Temp 36.8 C 07/10/19 0547  Pulse 48 07/10/19 0555  Resp 18 07/10/19 0555  SpO2 100 % 07/10/19 0555  Vitals shown include unvalidated device data.  Last Pain:  Vitals:   07/10/19 0336  TempSrc: Rectal         Complications: No apparent anesthesia complications

## 2019-07-10 NOTE — Anesthesia Procedure Notes (Signed)
Date/Time: 07/10/2019 5:41 AM Performed by: Cynda Familia, CRNA Oxygen Delivery Method: Simple face mask Placement Confirmation: positive ETCO2 and breath sounds checked- equal and bilateral Dental Injury: Teeth and Oropharynx as per pre-operative assessment

## 2019-07-10 NOTE — ED Notes (Signed)
Lab called and stated will be here for collect labwork in 15 minutes

## 2019-07-10 NOTE — ED Notes (Signed)
Pt O2 stat 96% on RA

## 2019-07-10 NOTE — Progress Notes (Signed)
Pharmacy Antibiotic Note  Danielle Dixon is a 78 y.o. female admitted on 07/09/2019 with sepsis secondary to pyohydronephrosis. Pharmacy has been consulted for cefepime dosing.  Plan: Need Wt- will use normalized CrCl for now = ~ 35 ml/min Cefepime 2 Gm IV q12h F/u scr/cultures     Temp (24hrs), Avg:101.2 F (38.4 C), Min:98.2 F (36.8 C), Max:102.9 F (39.4 C)  Recent Labs  Lab 07/10/19 0227  WBC 14.2*  CREATININE 1.51*  LATICACIDVEN 2.6*    CrCl cannot be calculated (Unknown ideal weight.).    No Known Allergies  Antimicrobials this admission: 10/28 cefepime >>  10/28 vancomycin >>   Dose adjustments this admission:   Microbiology results:  BCx:   UCx:    Sputum:    MRSA PCR:   Thank you for allowing pharmacy to be a part of this patient's care.  Dorrene German 07/10/2019 4:32 AM

## 2019-07-10 NOTE — Plan of Care (Signed)
Pt denies pain.
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
## 2019-07-10 NOTE — H&P (Signed)
H&P  Physician requesting consult: Jennette Kettle  Chief Complaint: Left ureteral calculus with hydronephrosis, right renal calculus, sepsis  History of Present Illness: 78 year old female with dementia currently in a care facility who also has rheumatoid arthritis and is on methotrexate presented to the emergency department after being found down in his floor.  She was also having rigors.  She was also having altered mental status.  She was noted to have some left CVA tenderness.  Work-up for stroke was reportedly negative.  Covid test was negative.  She was found to have elevated creatinine at 1.51, leukocytosis of 14.2.  No urinalysis has been performed.  Lactic acid mildly elevated at 2.6.  She was febrile.  She underwent a CT renal stone protocol that revealed an obstructing left proximal ureteral calculus with upstream hydronephrosis.  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  Patient is minimally verbal at current.  I spoke with her sister who is power of medical attorney.  Past Medical History:  Diagnosis Date  . Dementia ([place])   . Dyspnea 06/25/2013  . Leukocytosis   . Leukocytosis, unspecified 06/25/2013  . Leukocytosis, unspecified 06/25/2013  . Psoriasis   . Unspecified vitamin D deficiency 06/25/2013   Past Surgical History:  Procedure Laterality Date  . APPENDECTOMY    . knee replacment Left   . TOE SURGERY      Home Medications:  (Not in a hospital admission)  Allergies: No Known Allergies  Family History  Problem Relation Age of Onset  . Cancer Mother        breast cancer  . Cancer Father        lung   Social History:  reports that she has never smoked. She does not have any smokeless tobacco history on file. She reports that she does not drink alcohol or use drugs.  ROS: A complete review of systems was performed.  All systems are negative except for pertinent findings as noted. ROS   Physical Exam:   Vital signs in last 24 hours: Temp:  [98.2 F (36.8 C)-102.9 F (39.4 C)] 102.9 F (39.4 C) (10/28 0336) Pulse Rate:  [51-80] 69 (10/28 0415) Resp:  [17-25] 22 (10/28 0415) BP: (114-186)/(52-82) 144/59 (10/28 0415) SpO2:  [92 %-100 %] 92 % (10/28 0415) General:  Alert and oriented, No acute distress HEENT: Normocephalic, atraumatic Neck: No JVD or lymphadenopathy Cardiovascular: Regular rate and rhythm Lungs: Regular rate and effort Abdomen: Soft, nontender, nondistended, no abdominal masses Back: No CVA tenderness Extremities: No edema Neurologic: Minimally verbal  Laboratory Data:  Results for orders placed or performed during the hospital encounter of 07/09/19 (from the past 24 hour(s))  Urinalysis, Routine w reflex microscopic     Status: Abnormal   Collection Time: 07/09/19 10:25 PM  Result Value Ref Range   Color, Urine AMBER (A) YELLOW   APPearance HAZY (A) CLEAR   Specific Gravity, Urine 1.023 1.005 - 1.030   pH 5.0 5.0 - 8.0   Glucose, UA NEGATIVE NEGATIVE mg/dL   Hgb urine dipstick MODERATE (A) NEGATIVE   Bilirubin Urine NEGATIVE NEGATIVE   Ketones, ur 5 (A) NEGATIVE mg/dL   Protein, ur 100 (A) NEGATIVE mg/dL   Nitrite NEGATIVE NEGATIVE   Leukocytes,Ua LARGE (A) NEGATIVE   RBC / HPF >50 (H) 0 - 5 RBC/hpf   WBC, UA 11-20 0 - 5 WBC/hpf   Bacteria, UA MANY (A) NONE SEEN   Squamous Epithelial / LPF 0-5  0 - 5   Amorphous Crystal PRESENT   Troponin I (High Sensitivity)     Status: Abnormal   Collection Time: 07/10/19 12:59 AM  Result Value Ref Range   Troponin I (High Sensitivity) 31 (H) <18 ng/L  SARS Coronavirus 2 by RT PCR (hospital order, performed in [place] hospital lab) Nasopharyngeal Nasopharyngeal Swab     Status: None   Collection Time: 07/10/19  1:31 AM   Specimen: Nasopharyngeal Swab  Result Value Ref Range   SARS Coronavirus 2 NEGATIVE NEGATIVE  CBC with Differential     Status: Abnormal   Collection Time: 07/10/19  2:27 AM  Result Value Ref  Range   WBC 14.2 (H) 4.0 - 10.5 K/uL   RBC 4.58 3.87 - 5.11 MIL/uL   Hemoglobin 14.7 12.0 - 15.0 g/dL   HCT 45.8 36.0 - 46.0 %   MCV 100.0 80.0 - 100.0 fL   MCH 32.1 26.0 - 34.0 pg   MCHC 32.1 30.0 - 36.0 g/dL   RDW 14.5 11.5 - 15.5 %   Platelets 217 150 - 400 K/uL   nRBC 0.0 0.0 - 0.2 %   Neutrophils Relative % 93 %   Neutro Abs 13.2 (H) 1.7 - 7.7 K/uL   Lymphocytes Relative 4 %   Lymphs Abs 0.6 (L) 0.7 - 4.0 K/uL   Monocytes Relative 2 %   Monocytes Absolute 0.3 0.1 - 1.0 K/uL   Eosinophils Relative 0 %   Eosinophils Absolute 0.0 0.0 - 0.5 K/uL   Basophils Relative 0 %   Basophils Absolute 0.0 0.0 - 0.1 K/uL   Immature Granulocytes 1 %   Abs Immature Granulocytes 0.09 (H) 0.00 - 0.07 K/uL  Comprehensive metabolic panel     Status: Abnormal   Collection Time: 07/10/19  2:27 AM  Result Value Ref Range   Sodium 140 135 - 145 mmol/L   Potassium 3.4 (L) 3.5 - 5.1 mmol/L   Chloride 104 98 - 111 mmol/L   CO2 21 (L) 22 - 32 mmol/L   Glucose, Bld 172 (H) 70 - 99 mg/dL   BUN 20 8 - 23 mg/dL   Creatinine, Ser 1.51 (H) 0.44 - 1.00 mg/dL   Calcium 8.6 (L) 8.9 - 10.3 mg/dL   Total Protein 6.3 (L) 6.5 - 8.1 g/dL   Albumin 3.2 (L) 3.5 - 5.0 g/dL   AST 26 15 - 41 U/L   ALT 15 0 - 44 U/L   Alkaline Phosphatase 116 38 - 126 U/L   Total Bilirubin 2.1 (H) 0.3 - 1.2 mg/dL   GFR calc non Af Amer 33 (L) >60 mL/min   GFR calc Af Amer 38 (L) >60 mL/min   Anion gap 15 5 - 15  Phosphorus     Status: None   Collection Time: 07/10/19  2:27 AM  Result Value Ref Range   Phosphorus 3.1 2.5 - 4.6 mg/dL  Magnesium     Status: None   Collection Time: 07/10/19  2:27 AM  Result Value Ref Range   Magnesium 1.7 1.7 - 2.4 mg/dL  Troponin I (High Sensitivity)     Status: Abnormal   Collection Time: 07/10/19  2:27 AM  Result Value Ref Range   Troponin I (High Sensitivity) 34 (H) <18 ng/L  Lactic acid, plasma     Status: Abnormal   Collection Time: 07/10/19  2:27 AM  Result Value Ref Range   Lactic  Acid, Venous 2.6 (HH) 0.5 - 1.9 mmol/L  CBG monitoring, ED  Status: Abnormal   Collection Time: 07/10/19  4:21 AM  Result Value Ref Range   Glucose-Capillary 171 (H) 70 - 99 mg/dL   Recent Results (from the past 240 hour(s))  SARS Coronavirus 2 by RT PCR (hospital order, performed in [facility] hospital lab) Nasopharyngeal Nasopharyngeal Swab     Status: None   Collection Time: 07/10/19  1:31 AM   Specimen: Nasopharyngeal Swab  Result Value Ref Range Status   SARS Coronavirus 2 NEGATIVE NEGATIVE Final    Comment: (NOTE) If result is NEGATIVE SARS-CoV-2 target nucleic acids are NOT DETECTED. The SARS-CoV-2 RNA is generally detectable in upper and lower  respiratory specimens during the acute phase of infection. The lowest  concentration of SARS-CoV-2 viral copies this assay can detect is 250  copies / mL. A negative result does not preclude SARS-CoV-2 infection  and should not be used as the sole basis for treatment or other  patient management decisions.  A negative result may occur with  improper specimen collection / handling, submission of specimen other  than nasopharyngeal swab, presence of viral mutation(s) within the  areas targeted by this assay, and inadequate number of viral copies  (<250 copies / mL). A negative result must be combined with clinical  observations, patient history, and epidemiological information. If result is POSITIVE SARS-CoV-2 target nucleic acids are DETECTED. The SARS-CoV-2 RNA is generally detectable in upper and lower  respiratory specimens dur ing the acute phase of infection.  Positive  results are indicative of active infection with SARS-CoV-2.  Clinical  correlation with patient history and other diagnostic information is  necessary to determine patient infection status.  Positive results do  not rule out bacterial infection or co-infection with other viruses. If result is PRESUMPTIVE POSTIVE SARS-CoV-2 nucleic acids MAY BE PRESENT.   A  presumptive positive result was obtained on the submitted specimen  and confirmed on repeat testing.  While 2019 novel coronavirus  (SARS-CoV-2) nucleic acids may be present in the submitted sample  additional confirmatory testing may be necessary for epidemiological  and / or clinical management purposes  to differentiate between  SARS-CoV-2 and other Sarbecovirus currently known to infect humans.  If clinically indicated additional testing with an alternate test  methodology [phone]) is advised. The SARS-CoV-2 RNA is generally  detectable in upper and lower respiratory sp ecimens during the acute  phase of infection. The expected result is Negative. Fact Sheet for Patients:  StrictlyIdeas.no Fact Sheet for Healthcare Providers: BankingDealers.co.za This test is not yet approved or cleared by the [place] FDA and has been authorized for detection and/or diagnosis of SARS-CoV-2 by FDA under an Emergency Use Authorization (EUA).  This EUA will remain in effect (meaning this test can be used) for the duration of the COVID-19 declaration under Section 564(b)(1) of the Act, 21 U.S.C. section 360bbb-3(b)(1), unless the authorization is terminated or revoked sooner. Performed at [facility], [place] [address] [place], [place] [address]    Creatinine: Recent Labs    07/10/19 0227  CREATININE 1.51*   CT scan personally reviewed and is detailed in the history of present illness  Impression/Assessment:  Left ureteral calculus, right renal calculus Hydronephrosis secondary to obstructing ureteral calculus Sepsis secondary to urinary tract infection  Plan:  Plan for urgent bilateral ureteral stent placement.  Appreciate hospitalist assistance with management of her infection and altered mental status.  I spoke with her sister and discussed the procedure.  She will need definitive management of the stones  down the road,  likely left ureteroscopy and right PCNL  [name], III 07/10/2019, 4:41 AM

## 2019-07-10 NOTE — Anesthesia Postprocedure Evaluation (Signed)
Anesthesia Post Note  Patient: Deundra Hullum  Procedure(s) Performed: CYSTOSCOPY WITH RETROGRADE PYELOGRAM AND BILATERAL URETERAL STENT PLACEMENT (Bilateral Ureter)     Patient location during evaluation: PACU Anesthesia Type: General Level of consciousness: awake and alert Pain management: pain level controlled Vital Signs Assessment: post-procedure vital signs reviewed and stable Respiratory status: spontaneous breathing, nonlabored ventilation, respiratory function stable and patient connected to nasal cannula oxygen Cardiovascular status: blood pressure returned to baseline and stable Postop Assessment: no apparent nausea or vomiting Anesthetic complications: no    Last Vitals:  Vitals:   07/10/19 0615 07/10/19 0622  BP: (!) 82/38 121/67  Pulse: (!) 56 65  Resp: 19 20  Temp:  36.8 C  SpO2: 96% 96%    Last Pain:  Vitals:   07/10/19 0336  TempSrc: Rectal                 Audry Pili

## 2019-07-10 NOTE — H&P (View-Only) (Signed)
H&P  Physician requesting consult: Jennette Kettle  Chief Complaint: Left ureteral calculus with hydronephrosis, right renal calculus, sepsis  History of Present Illness: 78 year old female with dementia currently in a care facility who also has rheumatoid arthritis and is on methotrexate presented to the emergency department after being found down in his floor.  She was also having rigors.  She was also having altered mental status.  She was noted to have some left CVA tenderness.  Work-up for stroke was reportedly negative.  Covid test was negative.  She was found to have elevated creatinine at 1.51, leukocytosis of 14.2.  No urinalysis has been performed.  Lactic acid mildly elevated at 2.6.  She was febrile.  She underwent a CT renal stone protocol that revealed an obstructing left proximal ureteral calculus with upstream hydronephrosis.  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  Patient is minimally verbal at current.  I spoke with her sister who is power of medical attorney.  Past Medical History:  Diagnosis Date  . Dementia ([place])   . Dyspnea 06/25/2013  . Leukocytosis   . Leukocytosis, unspecified 06/25/2013  . Leukocytosis, unspecified 06/25/2013  . Psoriasis   . Unspecified vitamin D deficiency 06/25/2013   Past Surgical History:  Procedure Laterality Date  . APPENDECTOMY    . knee replacment Left   . TOE SURGERY      Home Medications:  (Not in a hospital admission)  Allergies: No Known Allergies  Family History  Problem Relation Age of Onset  . Cancer Mother        breast cancer  . Cancer Father        lung   Social History:  reports that she has never smoked. She does not have any smokeless tobacco history on file. She reports that she does not drink alcohol or use drugs.  ROS: A complete review of systems was performed.  All systems are negative except for pertinent findings as noted. ROS   Physical Exam:   Vital signs in last 24 hours: Temp:  [98.2 F (36.8 C)-102.9 F (39.4 C)] 102.9 F (39.4 C) (10/28 0336) Pulse Rate:  [51-80] 69 (10/28 0415) Resp:  [17-25] 22 (10/28 0415) BP: (114-186)/(52-82) 144/59 (10/28 0415) SpO2:  [92 %-100 %] 92 % (10/28 0415) General:  Alert and oriented, No acute distress HEENT: Normocephalic, atraumatic Neck: No JVD or lymphadenopathy Cardiovascular: Regular rate and rhythm Lungs: Regular rate and effort Abdomen: Soft, nontender, nondistended, no abdominal masses Back: No CVA tenderness Extremities: No edema Neurologic: Minimally verbal  Laboratory Data:  Results for orders placed or performed during the hospital encounter of 07/09/19 (from the past 24 hour(s))  Urinalysis, Routine w reflex microscopic     Status: Abnormal   Collection Time: 07/09/19 10:25 PM  Result Value Ref Range   Color, Urine AMBER (A) YELLOW   APPearance HAZY (A) CLEAR   Specific Gravity, Urine 1.023 1.005 - 1.030   pH 5.0 5.0 - 8.0   Glucose, UA NEGATIVE NEGATIVE mg/dL   Hgb urine dipstick MODERATE (A) NEGATIVE   Bilirubin Urine NEGATIVE NEGATIVE   Ketones, ur 5 (A) NEGATIVE mg/dL   Protein, ur 100 (A) NEGATIVE mg/dL   Nitrite NEGATIVE NEGATIVE   Leukocytes,Ua LARGE (A) NEGATIVE   RBC / HPF >50 (H) 0 - 5 RBC/hpf   WBC, UA 11-20 0 - 5 WBC/hpf   Bacteria, UA MANY (A) NONE SEEN   Squamous Epithelial / LPF 0-5  0 - 5   Amorphous Crystal PRESENT   Troponin I (High Sensitivity)     Status: Abnormal   Collection Time: 07/10/19 12:59 AM  Result Value Ref Range   Troponin I (High Sensitivity) 31 (H) <18 ng/L  SARS Coronavirus 2 by RT PCR (hospital order, performed in [place] hospital lab) Nasopharyngeal Nasopharyngeal Swab     Status: None   Collection Time: 07/10/19  1:31 AM   Specimen: Nasopharyngeal Swab  Result Value Ref Range   SARS Coronavirus 2 NEGATIVE NEGATIVE  CBC with Differential     Status: Abnormal   Collection Time: 07/10/19  2:27 AM  Result Value Ref  Range   WBC 14.2 (H) 4.0 - 10.5 K/uL   RBC 4.58 3.87 - 5.11 MIL/uL   Hemoglobin 14.7 12.0 - 15.0 g/dL   HCT 45.8 36.0 - 46.0 %   MCV 100.0 80.0 - 100.0 fL   MCH 32.1 26.0 - 34.0 pg   MCHC 32.1 30.0 - 36.0 g/dL   RDW 14.5 11.5 - 15.5 %   Platelets 217 150 - 400 K/uL   nRBC 0.0 0.0 - 0.2 %   Neutrophils Relative % 93 %   Neutro Abs 13.2 (H) 1.7 - 7.7 K/uL   Lymphocytes Relative 4 %   Lymphs Abs 0.6 (L) 0.7 - 4.0 K/uL   Monocytes Relative 2 %   Monocytes Absolute 0.3 0.1 - 1.0 K/uL   Eosinophils Relative 0 %   Eosinophils Absolute 0.0 0.0 - 0.5 K/uL   Basophils Relative 0 %   Basophils Absolute 0.0 0.0 - 0.1 K/uL   Immature Granulocytes 1 %   Abs Immature Granulocytes 0.09 (H) 0.00 - 0.07 K/uL  Comprehensive metabolic panel     Status: Abnormal   Collection Time: 07/10/19  2:27 AM  Result Value Ref Range   Sodium 140 135 - 145 mmol/L   Potassium 3.4 (L) 3.5 - 5.1 mmol/L   Chloride 104 98 - 111 mmol/L   CO2 21 (L) 22 - 32 mmol/L   Glucose, Bld 172 (H) 70 - 99 mg/dL   BUN 20 8 - 23 mg/dL   Creatinine, Ser 1.51 (H) 0.44 - 1.00 mg/dL   Calcium 8.6 (L) 8.9 - 10.3 mg/dL   Total Protein 6.3 (L) 6.5 - 8.1 g/dL   Albumin 3.2 (L) 3.5 - 5.0 g/dL   AST 26 15 - 41 U/L   ALT 15 0 - 44 U/L   Alkaline Phosphatase 116 38 - 126 U/L   Total Bilirubin 2.1 (H) 0.3 - 1.2 mg/dL   GFR calc non Af Amer 33 (L) >60 mL/min   GFR calc Af Amer 38 (L) >60 mL/min   Anion gap 15 5 - 15  Phosphorus     Status: None   Collection Time: 07/10/19  2:27 AM  Result Value Ref Range   Phosphorus 3.1 2.5 - 4.6 mg/dL  Magnesium     Status: None   Collection Time: 07/10/19  2:27 AM  Result Value Ref Range   Magnesium 1.7 1.7 - 2.4 mg/dL  Troponin I (High Sensitivity)     Status: Abnormal   Collection Time: 07/10/19  2:27 AM  Result Value Ref Range   Troponin I (High Sensitivity) 34 (H) <18 ng/L  Lactic acid, plasma     Status: Abnormal   Collection Time: 07/10/19  2:27 AM  Result Value Ref Range   Lactic  Acid, Venous 2.6 (HH) 0.5 - 1.9 mmol/L  CBG monitoring, ED  Status: Abnormal   Collection Time: 07/10/19  4:21 AM  Result Value Ref Range   Glucose-Capillary 171 (H) 70 - 99 mg/dL   Recent Results (from the past 240 hour(s))  SARS Coronavirus 2 by RT PCR (hospital order, performed in [facility] hospital lab) Nasopharyngeal Nasopharyngeal Swab     Status: None   Collection Time: 07/10/19  1:31 AM   Specimen: Nasopharyngeal Swab  Result Value Ref Range Status   SARS Coronavirus 2 NEGATIVE NEGATIVE Final    Comment: (NOTE) If result is NEGATIVE SARS-CoV-2 target nucleic acids are NOT DETECTED. The SARS-CoV-2 RNA is generally detectable in upper and lower  respiratory specimens during the acute phase of infection. The lowest  concentration of SARS-CoV-2 viral copies this assay can detect is 250  copies / mL. A negative result does not preclude SARS-CoV-2 infection  and should not be used as the sole basis for treatment or other  patient management decisions.  A negative result may occur with  improper specimen collection / handling, submission of specimen other  than nasopharyngeal swab, presence of viral mutation(s) within the  areas targeted by this assay, and inadequate number of viral copies  (<250 copies / mL). A negative result must be combined with clinical  observations, patient history, and epidemiological information. If result is POSITIVE SARS-CoV-2 target nucleic acids are DETECTED. The SARS-CoV-2 RNA is generally detectable in upper and lower  respiratory specimens dur ing the acute phase of infection.  Positive  results are indicative of active infection with SARS-CoV-2.  Clinical  correlation with patient history and other diagnostic information is  necessary to determine patient infection status.  Positive results do  not rule out bacterial infection or co-infection with other viruses. If result is PRESUMPTIVE POSTIVE SARS-CoV-2 nucleic acids MAY BE PRESENT.   A  presumptive positive result was obtained on the submitted specimen  and confirmed on repeat testing.  While 2019 novel coronavirus  (SARS-CoV-2) nucleic acids may be present in the submitted sample  additional confirmatory testing may be necessary for epidemiological  and / or clinical management purposes  to differentiate between  SARS-CoV-2 and other Sarbecovirus currently known to infect humans.  If clinically indicated additional testing with an alternate test  methodology [phone]) is advised. The SARS-CoV-2 RNA is generally  detectable in upper and lower respiratory sp ecimens during the acute  phase of infection. The expected result is Negative. Fact Sheet for Patients:  StrictlyIdeas.no Fact Sheet for Healthcare Providers: BankingDealers.co.za This test is not yet approved or cleared by the [place] FDA and has been authorized for detection and/or diagnosis of SARS-CoV-2 by FDA under an Emergency Use Authorization (EUA).  This EUA will remain in effect (meaning this test can be used) for the duration of the COVID-19 declaration under Section 564(b)(1) of the Act, 21 U.S.C. section 360bbb-3(b)(1), unless the authorization is terminated or revoked sooner. Performed at [facility], [place] [address] [place],  [address]    Creatinine: Recent Labs    07/10/19 0227  CREATININE 1.51*   CT scan personally reviewed and is detailed in the history of present illness  Impression/Assessment:  Left ureteral calculus, right renal calculus Hydronephrosis secondary to obstructing ureteral calculus Sepsis secondary to urinary tract infection  Plan:  Plan for urgent bilateral ureteral stent placement.  Appreciate hospitalist assistance with management of her infection and altered mental status.  I spoke with her sister and discussed the procedure.  She will need definitive management of the stones  down the road,  likely left ureteroscopy and right PCNL  [name], III 07/10/2019, 4:41 AM

## 2019-07-11 ENCOUNTER — Encounter (HOSPITAL_COMMUNITY): Payer: Self-pay | Admitting: Urology

## 2019-07-11 LAB — BASIC METABOLIC PANEL
Anion gap: 5 (ref 5–15)
BUN: 19 mg/dL (ref 8–23)
CO2: 22 mmol/L (ref 22–32)
Calcium: 7.8 mg/dL — ABNORMAL LOW (ref 8.9–10.3)
Chloride: 111 mmol/L (ref 98–111)
Creatinine, Ser: 0.86 mg/dL (ref 0.44–1.00)
GFR calc Af Amer: >60 mL/min (ref >60)
GFR calc non Af Amer: >60 mL/min (ref >60)
Glucose, Bld: 108 mg/dL — ABNORMAL HIGH (ref 70–99)
Potassium: 2.9 mmol/L — ABNORMAL LOW (ref 3.5–5.1)
Sodium: 138 mmol/L (ref 135–145)

## 2019-07-11 LAB — URINE CULTURE: Culture: NO GROWTH

## 2019-07-11 LAB — GLUCOSE, CAPILLARY
Glucose-Capillary: 101 mg/dL — ABNORMAL HIGH (ref 70–99)
Glucose-Capillary: 92 mg/dL (ref 70–99)
Glucose-Capillary: 82 mg/dL (ref 70–99)
Glucose-Capillary: 89 mg/dL (ref 70–99)
Glucose-Capillary: 84 mg/dL (ref 70–99)

## 2019-07-11 LAB — CBC
HCT: 33.4 % — ABNORMAL LOW (ref 36.0–46.0)
Hemoglobin: 10.7 g/dL — ABNORMAL LOW (ref 12.0–15.0)
MCH: 32 pg (ref 26.0–34.0)
MCHC: 32 g/dL (ref 30.0–36.0)
MCV: 100 fL (ref 80.0–100.0)
Platelets: 141 10*3/uL — ABNORMAL LOW (ref 150–400)
RBC: 3.34 MIL/uL — ABNORMAL LOW (ref 3.87–5.11)
RDW: 14.6 % (ref 11.5–15.5)
WBC: 7.5 10*3/uL (ref 4.0–10.5)
nRBC: 0 % (ref 0.0–0.2)

## 2019-07-11 LAB — POTASSIUM: Potassium: 3.7 mmol/L (ref 3.5–5.1)

## 2019-07-11 MED ORDER — HALOPERIDOL 0.5 MG PO TABS
1.0000 mg | ORAL_TABLET | Freq: Four times a day (QID) | ORAL | Status: DC | PRN
  Administered 2019-07-12: 1 mg via ORAL
  Filled 2019-07-11: qty 1

## 2019-07-11 MED ORDER — POTASSIUM CHLORIDE CRYS ER 20 MEQ PO TBCR
40.0000 meq | EXTENDED_RELEASE_TABLET | ORAL | Status: AC
  Administered 2019-07-11 (×2): 40 meq via ORAL
  Filled 2019-07-11 (×2): qty 2

## 2019-07-11 MED ORDER — CHLORHEXIDINE GLUCONATE CLOTH 2 % EX PADS
6.0000 | MEDICATED_PAD | Freq: Every day | CUTANEOUS | Status: DC
  Administered 2019-07-11 – 2019-07-17 (×6): 6 via TOPICAL

## 2019-07-11 MED ORDER — HYDRALAZINE HCL 20 MG/ML IJ SOLN: 10.0000 mg | Freq: Four times a day (QID) | INTRAMUSCULAR | Status: DC | PRN

## 2019-07-11 MED ORDER — HALOPERIDOL LACTATE 5 MG/ML IJ SOLN: 1.0000 mg | Freq: Four times a day (QID) | INTRAMUSCULAR | Status: DC | PRN

## 2019-07-11 MED ORDER — HYDRALAZINE HCL 20 MG/ML IJ SOLN
10.0000 mg | Freq: Four times a day (QID) | INTRAMUSCULAR | Status: DC | PRN
  Administered 2019-07-11 – 2019-07-14 (×5): 10 mg via INTRAVENOUS
  Filled 2019-07-11 (×5): qty 1

## 2019-07-11 MED ORDER — HYDRALAZINE HCL 10 MG PO TABS
10.0000 mg | ORAL_TABLET | Freq: Four times a day (QID) | ORAL | Status: DC | PRN
  Administered 2019-07-13: 10 mg via ORAL
  Filled 2019-07-11: qty 1
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
## 2019-07-11 NOTE — [place] Notes (Addendum)
Potassium 2.9. Paged Dewaine Oats. KCL 96meq PO ordered every 4 hours x 2 doses. Will continue to monitor.

## 2019-07-11 NOTE — Evaluation (Addendum)
Clinical/Bedside Swallow Evaluation Patient Details  Name: Danielle Dixon MRN: [id number] Date of Birth: 05-25-1941  Today's Date: 07/11/2019 Time: SLP Start Time (ACUTE ONLY): 0735 SLP Stop Time (ACUTE ONLY): 0805 SLP Time Calculation (min) (ACUTE ONLY): 30 min  Past Medical History:  Past Medical History:  Diagnosis Date  . Dementia ([place])   . Dyspnea 06/25/2013  . Leukocytosis   . Leukocytosis, unspecified 06/25/2013  . Leukocytosis, unspecified 06/25/2013  . Psoriasis   . Unspecified vitamin D deficiency 06/25/2013   Past Surgical History:  Past Surgical History:  Procedure Laterality Date  . APPENDECTOMY    . CYSTOSCOPY W/ URETERAL STENT PLACEMENT Bilateral 07/10/2019   Procedure: CYSTOSCOPY WITH RETROGRADE PYELOGRAM AND BILATERAL URETERAL STENT PLACEMENT;  Surgeon: Lucas Mallow, MD;  Location: [facility];  Service: Urology;  Laterality: Bilateral;  . knee replacment Left   . TOE SURGERY     HPI:  pt is a 78 yo female adm to [facility] after being found [facility] at [facility].  She has h/o RA and "mild dementia"  She required cystoscopy with bilateral stent placement under general anesthesia.  Swallow evaluation was ordered. .   Assessment / Plan / Recommendation Clinical Impression  Pt with negative CN exam, she does present with generalized weakness.  Voice is hoarse suspect due to short term intubation for surgery but SLP uncertain given pt's cognitive deficits impacting her ability to provide history.  Pt also with mandibular tremor observed which she states is new.  Intake of 2 ounces applesauce, single cracker, 4 ounces juice *nectar, and 6 ounces of water observed.  Pt is able to feed herself with her right hand but eats at very rapid rate.  She presented with clinical indication of oral holding and possible laryngeal penetration/aspiration with thin water via cup/straw.  Nectar thick liquid, tsp amounts of water and solids tolerated without initial coughing.    However approximately 10  minutes after pt completed snack she began a period of prolonged coughing (nonproductive) causing SLP to question if she could have an esophageal component to dysphagia.  This largely ceased after approximately 10 minutes also therefore delayed increased cough appeared coorelated to po.    Hopeful for improved swallow ability as pt progresses and ? decreased possible edema in pharynx.  Options include modified diet today (full liquid, nectar and thin water between meals after mouth care) vs NPO except medication and tsps thin vs instrumental eval MBS to assure not aspirating from residuals or premature spillage &/or esophagram.  Pt did have cough when admitted at baseline per RN.   SLP Visit Diagnosis: Dysphagia, unspecified (R13.10)    Aspiration Risk  Moderate aspiration risk    Diet Recommendation Nectar-thick liquid(thin water between meals)   Medication Administration: Whole meds with puree Supervision: Patient able to self feed;Full supervision/cueing for compensatory strategies Compensations: Slow rate;Small sips/bites Postural Changes: Remain upright for at least 30 minutes after po intake;Seated upright at 90 degrees    Other  Recommendations Oral Care Recommendations: Oral care BID Other Recommendations: Order thickener from pharmacy   Follow up Recommendations Other (comment)      Frequency and Duration min 2x/week  2 weeks       Prognosis Prognosis for Safe Diet Advancement: Good Barriers to Reach Goals: Cognitive deficits      Swallow Study   General Date of Onset: 07/11/19 HPI: pt is a 78 yo female adm to [facility] after being found down at [facility].  She has h/o RA and "mild dementia"  She required cystoscopy with bilateral stent placement under general anesthesia.  Swallow evaluation was ordered. . Type of Study: Bedside Swallow Evaluation Diet Prior to this Study: NPO Temperature Spikes Noted: No Respiratory Status: Room air History of Recent Intubation: Yes Length of  Intubations (days): 1 days(for surgery) Date extubated: 07/10/19(post surgery) Behavior/Cognition: Alert;Cooperative(has dementia, oriented to self, place after advised and tested twice during session) Oral Cavity Assessment: Within Functional Limits Oral Care Completed by SLP: No Oral Cavity - Dentition: Adequate natural dentition Vision: Functional for self-feeding Self-Feeding Abilities: Able to feed self Patient Positioning: Upright in bed Baseline Vocal Quality: Hoarse Volitional Cough: Strong Volitional Swallow: Unable to elicit    Oral/Motor/Sensory Function Overall Oral Motor/Sensory Function: Generalized oral weakness(pt with tremorous facial movement, reports "new" but uncertain and no family present to confirm) Facial ROM: Within Functional Limits Facial Symmetry: Within Functional Limits Facial Strength: Within Functional Limits Lingual ROM: (reduced overall) Lingual Symmetry: Within Functional Limits Lingual Strength: Reduced Velum: Within Functional Limits Mandible: (dnt)   Ice Chips Ice chips: Within functional limits Presentation: Spoon   Thin Liquid Thin Liquid: Impaired Oral Phase Functional Implications: Oral holding Other Comments: cough with water boluses via cup x2/7    Nectar Thick Nectar Thick Liquid: Impaired Oral phase functional implications: Other (comment);Oral holding Pharyngeal Phase Impairments: Suspected delayed Swallow   Honey Thick     Puree Puree: Within functional limits Presentation: Self Fed;Spoon   Solid     Solid: Within functional limits Presentation: Self Fed;Spoon      Macario Golds 07/11/2019,8:22 AM   Luanna Salk, MS [facility] SLP [place] Pager [phone] Office [phone]

## 2019-07-12 ENCOUNTER — Inpatient Hospital Stay (HOSPITAL_COMMUNITY): Payer: Medicare Other

## 2019-07-12 LAB — CBC
HCT: 38.5 % (ref 36.0–46.0)
Hemoglobin: 12.2 g/dL (ref 12.0–15.0)
MCH: 32.1 pg (ref 26.0–34.0)
MCHC: 31.7 g/dL (ref 30.0–36.0)
MCV: 101.3 fL — ABNORMAL HIGH (ref 80.0–100.0)
Platelets: 174 10*3/uL (ref 150–400)
RBC: 3.8 MIL/uL — ABNORMAL LOW (ref 3.87–5.11)
RDW: 14.9 % (ref 11.5–15.5)
WBC: 10.4 10*3/uL (ref 4.0–10.5)
nRBC: 0 % (ref 0.0–0.2)

## 2019-07-12 LAB — GLUCOSE, CAPILLARY
Glucose-Capillary: 89 mg/dL (ref 70–99)
Glucose-Capillary: 93 mg/dL (ref 70–99)
Glucose-Capillary: 96 mg/dL (ref 70–99)
Glucose-Capillary: 87 mg/dL (ref 70–99)
Glucose-Capillary: 94 mg/dL (ref 70–99)
Glucose-Capillary: 86 mg/dL (ref 70–99)

## 2019-07-12 LAB — BASIC METABOLIC PANEL
Anion gap: 8 (ref 5–15)
BUN: 11 mg/dL (ref 8–23)
CO2: 20 mmol/L — ABNORMAL LOW (ref 22–32)
Calcium: 8.1 mg/dL — ABNORMAL LOW (ref 8.9–10.3)
Chloride: 116 mmol/L — ABNORMAL HIGH (ref 98–111)
Creatinine, Ser: 0.68 mg/dL (ref 0.44–1.00)
GFR calc Af Amer: >60 mL/min (ref >60)
GFR calc non Af Amer: >60 mL/min (ref >60)
Glucose, Bld: 99 mg/dL (ref 70–99)
Potassium: 3.2 mmol/L — ABNORMAL LOW (ref 3.5–5.1)
Sodium: 144 mmol/L (ref 135–145)

## 2019-07-12 MED ORDER — LIP MEDEX EX OINT
TOPICAL_OINTMENT | CUTANEOUS | Status: DC | PRN
  Administered 2019-07-12: via TOPICAL
  Filled 2019-07-12: qty 7

## 2019-07-12 NOTE — Progress Notes (Signed)
Urology Inpatient Progress Report  Pyohydronephrosis [N13.6] Sepsis with acute renal failure without septic shock, due to unspecified organism, unspecified acute renal failure type ([place]) [A41.9, R65.20, N17.9]  Procedure(s): CYSTOSCOPY WITH RETROGRADE PYELOGRAM AND BILATERAL URETERAL STENT PLACEMENT  2 Days Post-Op   Intv/Subj: No acute events overnight.  Patient has been afebrile.  She continues to have confusion but she does have dementia.  Urine culture shows no growth.  Blood cultures negative.  Principal Problem:   Pyohydronephrosis Active Problems:   Sepsis ([place])   Mild dementia (HCC)   Acute metabolic encephalopathy   Rheumatoid arthritis (HCC)  Current Facility-Administered Medications  Medication Dose Route Frequency Provider Last Rate Last Dose  . 0.9 %  sodium chloride infusion   Intravenous Continuous Mariel Aloe, MD 100 mL/hr at 07/12/19 0900    . acetaminophen (TYLENOL) tablet 650 mg  650 mg Oral Q6H PRN Etta Quill, DO       Or  . acetaminophen (TYLENOL) suppository 650 mg  650 mg Rectal Q6H PRN Etta Quill, DO      . acetaminophen (TYLENOL) tablet 1,000 mg  1,000 mg Oral Once McDonald, Mia A, PA-C      . ARIPiprazole (ABILIFY) tablet 2 mg  2 mg Oral Daily Mariel Aloe, MD   2 mg at 07/12/19 1043  . atenolol (TENORMIN) tablet 25 mg  25 mg Oral Daily Mariel Aloe, MD   25 mg at 07/12/19 1042  . buPROPion (WELLBUTRIN SR) 12 hr tablet 100 mg  100 mg Oral Daily Mariel Aloe, MD   100 mg at 07/12/19 1043  . ceFEPIme (MAXIPIME) 2 g in sodium chloride 0.9 % 100 mL IVPB  2 g Intravenous Q12H Dorrene German, [facility]   Stopped at 07/12/19 1524  . Chlorhexidine Gluconate Cloth 2 % PADS 6 each  6 each Topical Daily Mariel Aloe, MD   6 each at 07/12/19 1043  . enoxaparin (LOVENOX) injection 40 mg  40 mg Subcutaneous Q24H Alcario Drought, Jared M, DO   40 mg at 07/12/19 1408  . haloperidol (HALDOL) tablet 1 mg  1 mg Oral Q6H PRN Norins, Heinz Knuckles, MD   1 mg at  07/12/19 [id number]   Or  . haloperidol lactate (HALDOL) injection 1 mg  1 mg Intramuscular Q6H PRN Norins, Heinz Knuckles, MD      . hydrALAZINE (APRESOLINE) injection 10 mg  10 mg Intravenous Q6H PRN Mariel Aloe, MD   10 mg at 07/12/19 1408  . hydrALAZINE (APRESOLINE) tablet 10 mg  10 mg Oral Q6H PRN Mariel Aloe, MD      . insulin aspart (novoLOG) injection 0-9 Units  0-9 Units Subcutaneous Q4H Etta Quill, DO   2 Units at 07/10/19 1645  . MEDLINE mouth rinse  15 mL Mouth Rinse BID Mariel Aloe, MD   15 mL at 07/12/19 1043  . ondansetron (ZOFRAN) tablet 4 mg  4 mg Oral Q6H PRN Etta Quill, DO       Or  . ondansetron [facility]) injection 4 mg  4 mg Intravenous Q6H PRN Etta Quill, DO      . PARoxetine (PAXIL-CR) 24 hr tablet 25 mg  25 mg Oral Daily Mariel Aloe, MD   25 mg at 07/12/19 1042     Objective: Vital: Vitals:   07/12/19 1000 07/12/19 1148 07/12/19 1200 07/12/19 1525  BP: (!) 193/84  (!) 198/96 (!) 190/61  Pulse: 67  (!) 59 67  Resp: (!) 26  11 19   Temp:  98 F (36.7 C)    TempSrc:  Axillary    SpO2: 95%  95% 93%  Weight:      Height:       I/Os: I/O last 3 completed shifts: In: 2601 [I.V.:2306; IV Piggyback:294.9] Out: [id number] [Urine:1425]  Physical Exam:  General: Patient is in no apparent distress Lungs: Normal respiratory effort, chest expands symmetrically. GI: The abdomen is soft and nontender without mass. Foley: Draining clear yellow urine Ext: lower extremities symmetric  Lab Results: Recent Labs    07/10/19 0227 07/11/19 0207 07/12/19 0826  WBC 14.2* 7.5 10.4  HGB 14.7 10.7* 12.2  HCT 45.8 33.4* 38.5   Recent Labs    07/10/19 0227 07/11/19 0207 07/11/19 1414 07/12/19 0826  NA 140 138  --  144  K 3.4* 2.9* 3.7 3.2*  CL 104 111  --  116*  CO2 21* 22  --  20*  GLUCOSE 172* 108*  --  99  BUN 20 19  --  11  CREATININE 1.51* 0.86  --  0.68  CALCIUM 8.6* 7.8*  --  8.1*   No results for input(s): LABPT, INR in the last 72  hours. No results for input(s): LABURIN in the last 72 hours. Results for orders placed or performed during the hospital encounter of 07/09/19  SARS Coronavirus 2 by RT PCR (hospital order, performed in [facility] hospital lab) Nasopharyngeal Nasopharyngeal Swab     Status: None   Collection Time: 07/10/19  1:31 AM   Specimen: Nasopharyngeal Swab  Result Value Ref Range Status   SARS Coronavirus 2 NEGATIVE NEGATIVE Final    Comment: (NOTE) If result is NEGATIVE SARS-CoV-2 target nucleic acids are NOT DETECTED. The SARS-CoV-2 RNA is generally detectable in upper and lower  respiratory specimens during the acute phase of infection. The lowest  concentration of SARS-CoV-2 viral copies this assay can detect is 250  copies / mL. A negative result does not preclude SARS-CoV-2 infection  and should not be used as the sole basis for treatment or other  patient management decisions.  A negative result may occur with  improper specimen collection / handling, submission of specimen other  than nasopharyngeal swab, presence of viral mutation(s) within the  areas targeted by this assay, and inadequate number of viral copies  (<250 copies / mL). A negative result must be combined with clinical  observations, patient history, and epidemiological information. If result is POSITIVE SARS-CoV-2 target nucleic acids are DETECTED. The SARS-CoV-2 RNA is generally detectable in upper and lower  respiratory specimens dur ing the acute phase of infection.  Positive  results are indicative of active infection with SARS-CoV-2.  Clinical  correlation with patient history and other diagnostic information is  necessary to determine patient infection status.  Positive results do  not rule out bacterial infection or co-infection with other viruses. If result is PRESUMPTIVE POSTIVE SARS-CoV-2 nucleic acids MAY BE PRESENT.   A presumptive positive result was obtained on the submitted specimen  and confirmed on  repeat testing.  While 2019 novel coronavirus  (SARS-CoV-2) nucleic acids may be present in the submitted sample  additional confirmatory testing may be necessary for epidemiological  and / or clinical management purposes  to differentiate between  SARS-CoV-2 and other Sarbecovirus currently known to infect humans.  If clinically indicated additional testing with an alternate test  methodology [phone]) is advised. The SARS-CoV-2 RNA is generally  detectable in upper and lower respiratory  sp ecimens during the acute  phase of infection. The expected result is Negative. Fact Sheet for Patients:  StrictlyIdeas.no Fact Sheet for Healthcare Providers: BankingDealers.co.za This test is not yet approved or cleared by the [place] FDA and has been authorized for detection and/or diagnosis of SARS-CoV-2 by FDA under an Emergency Use Authorization (EUA).  This EUA will remain in effect (meaning this test can be used) for the duration of the COVID-19 declaration under Section 564(b)(1) of the Act, 21 U.S.C. section 360bbb-3(b)(1), unless the authorization is terminated or revoked sooner. Performed at [facility], [place] [address], [place], [place] [address]   Urine Culture     Status: None   Collection Time: 07/10/19  5:26 AM   Specimen: Urine, Cystoscope  Result Value Ref Range Status   Specimen Description   Final    CYSTOSCOPY Performed at [facility], [place] [address] [place], [place] [address]    Special Requests   Final    NONE Performed at [facility], [place] [address], [place], [place] [address]    Culture   Final    NO GROWTH Performed at [facility], [place] [address], [place], [place] [address]    Report Status 07/11/2019 FINAL  Final  MRSA PCR Screening     Status: None   Collection Time: 07/10/19  6:57 AM   Specimen: Nasal Mucosa; Nasopharyngeal  Result Value Ref Range  Status   MRSA by PCR NEGATIVE NEGATIVE Final    Comment:        The GeneXpert MRSA Assay (FDA approved for NASAL specimens only), is one component of a comprehensive MRSA colonization surveillance program. It is not intended to diagnose MRSA infection nor to guide or monitor treatment for MRSA infections. Performed at [facility], [place] [address] [place],  [address]     Studies/Results: Dg Knee 1-2 Views Right  Result Date: 07/12/2019 CLINICAL DATA:  Right knee pain near the fibular head. EXAM: RIGHT KNEE - 1-2 VIEW COMPARISON:  07/09/2019 FINDINGS: Mild-to-moderate tricompartmental osteoarthritis most prominent over the patellofemoral joint. Chondrocalcinosis over the medial and lateral compartments. No evidence of fracture or dislocation. No significant joint effusion. IMPRESSION: No acute findings. Mild-to-moderate osteoarthritis. Electronically Signed   By: Marin Olp M.D.   On: 07/12/2019 09:58   Dg Chest Port 1 View  Result Date: 07/12/2019 CLINICAL DATA:  Ureteral stent placed 2 days ago. Abnormal breath sounds morning. EXAM: PORTABLE CHEST 1 VIEW COMPARISON:  07/10/2019 FINDINGS: Lungs are adequately inflated without focal lobar consolidation or effusion. Mild stable cardiomegaly. Remainder of the exam is unchanged. IMPRESSION: No acute cardiopulmonary disease. Mild stable cardiomegaly. Electronically Signed   By: Marin Olp M.D.   On: 07/12/2019 09:59    Assessment: Right renal calculus, left ureteral calculus Sepsis secondary to urinary tract infection Procedure(s): CYSTOSCOPY WITH RETROGRADE PYELOGRAM AND BILATERAL URETERAL STENT PLACEMENT, 2 Days Post-Op  doing well.  Plan: Continue antibiotics.  Plan to remove Foley catheter in the morning.  I will arrange for left ureteroscopy and right PCNL in the next couple of weeks.   Link Snuffer, MD Urology 07/12/2019, 4:02 PM

## 2019-07-12 NOTE — Progress Notes (Signed)
  Speech Language Pathology Treatment: Dysphagia  Patient Details Name: Danielle Dixon MRN: [id number] DOB: [birth year] Today's Date: 07/12/2019 Time: [id number] SLP Time Calculation (min) (ACUTE ONLY): 25 min  Assessment / Plan / Recommendation Clinical Impression  Pt seen at bedside for reassessment of swallow function and safety. Pt was awake and alert upon arrival of SLP. Nurse tech present. Pt accepted trials of ice chips, thin liquid, puree, and solid textures. Timely oral prep and clearing noted. No overt s/s aspiration observed, even when challenged with multiple large consecutive boluses. Pt did exhibit belching and gagging/regurgitation, and indicated globus sensation. Several minutes after termination of PO trials, pt exhibited coughing. This presentation raises suspicion for primary esophageal issues. Pt appears safe to begin soft solids (Dys3) and thin liquids, however, completion of esophageal work up is recommended prior to initiating PO diet. RN and informed.  SLP will follow up after results are available, and proceed with PO diet and education for safe swallow.    HPI HPI: pt is a 78 yo female adm to [facility] after being found down at [facility].  She has h/o RA and "mild dementia"  She required cystoscopy with bilateral stent placement under general anesthesia.  Swallow evaluation was ordered. CXR 10/30 = No acute cardiopulmonary disease.      SLP Plan  Continue with current plan of care       Recommendations  Diet recommendations: (pending esophageal work up) Medication Administration: Whole meds with puree                Oral Care Recommendations: Oral care BID Follow up Recommendations: 24 hour supervision/assistance;Skilled Nursing facility SLP Visit Diagnosis: Dysphagia, unspecified (R13.10) Plan: Continue with current plan of care       [place]. Quentin Ore, [facility], [place] Speech Language Pathologist Office: [phone] Pager: [phone]  Danielle Dixon 07/12/2019, 12:31 PM

## 2019-07-12 NOTE — Progress Notes (Signed)
Report called to [facility] room 1612. Patient with no complaints at the current time. Will transfer via. Bed.

## 2019-07-12 NOTE — Progress Notes (Signed)
Attempted to call to receive report on patient.  Will try again shortly

## 2019-07-12 NOTE — Progress Notes (Signed)
PROGRESS NOTE    Danielle Dixon  [id number] DOB: Dec 13, 1940 DOA: 07/09/2019 PCP: Jonathon Jordan, MD   Brief Narrative: Danielle Dixon is a 78 y.o. female with a history of dementia, rheumatoid arthritis. Patient presented secondary to being found down with associated rigors. On admission, found to have renal/ureteral calculi with hydronephrosis. Patient underwent urgent bilateral ureteral stent placement. On antibiotics with urine culture pending.   Assessment & Plan:   Principal Problem:   Pyohydronephrosis Active Problems:   Sepsis ([place])   Mild dementia ([place])   Acute metabolic encephalopathy   Rheumatoid arthritis ([place])   Left ureteral calculus Right renal calculus Hydronephrosis Patient underwent urgent bilateral ureteral stent placement on admission. Cultures no growth (obtained after antibiotics)  Sepsis Secondary to above. Present on admission. Vancomycin and cefepime given in ED. No blood cultures obtained. Urine culture from cystoscopy (after antibiotics) with no growth. -Continue Cefepime  Dysphagia Concern for esophageal etiology per SLP evaluation since patient has delayed symptomatology.  -DG esophagram  Dementia Patient resides in a memory care unit per chart review -Continue home Abilify, Paxil, Wellbutrin  Acute blood loss anemia In setting of recent surgery. Mild blood seen via urinary catheter. Hemoglobin stable.  Acute metabolic encephalopathy Possibly secondary to infection and complicated by anesthesia. Appears improved today.  Agitation/delirium Likely secondary to change in environment. Resolved with resumption of home medications.  Pressure injury Mid sacrum, present on admission.  Rheumatoid arthritis -Hold methotrexate secondary to infection  Knee pain Unsure of etiology -Right knee x-ray  DVT prophylaxis: Lovenox Code Status:   Code Status: Full Code Family Communication: None at bedside Disposition Plan: Discharge pending  urology recommendations   Consultants:   Urology  Procedures:   10/28: Bilateral ureteral stent placement  Antimicrobials:  Vancomycin  Cefepime    Subjective: Patient states she is having pain all over.  Objective: Vitals:   07/12/19 0804 07/12/19 1000 07/12/19 1148 07/12/19 1200  BP:  (!) 193/84  (!) 198/96  Pulse:  67  (!) 59  Resp:  (!) 26  11  Temp: 97.6 F (36.4 C)  98 F (36.7 C)   TempSrc: Oral  Axillary   SpO2:  95%  95%  Weight:      Height:        Intake/Output Summary (Last 24 hours) at 07/12/2019 1506 Last data filed at 07/12/2019 0900 Gross per 24 hour  Intake 3040.16 ml  Output 1100 ml  Net 1940.16 ml   Filed Weights   07/10/19 0900  Weight: 67.9 kg    Examination:  General exam: Appears calm and comfortable Respiratory system: Rhonchi. Respiratory effort normal. Cardiovascular system: S1 & S2 heard, RRR. No murmurs, rubs, gallops or clicks. Gastrointestinal system: Abdomen is nondistended, soft and nontender. Normal bowel sounds heard. Central nervous system: Alert and oriented to person. No focal neurological deficits. Extremities: No edema. No calf tenderness. Tenderness over right fibular head. Skin: No cyanosis. No rashes Psychiatry: Judgement and insight appear impaired    Data Reviewed: I have personally reviewed following labs and imaging studies  CBC: Recent Labs  Lab 07/10/19 0227 07/11/19 0207 07/12/19 0826  WBC 14.2* 7.5 10.4  NEUTROABS 13.2*  --   --   HGB 14.7 10.7* 12.2  HCT 45.8 33.4* 38.5  MCV 100.0 100.0 101.3*  PLT 217 141* [id number]   Basic Metabolic Panel: Recent Labs  Lab 07/10/19 0227 07/11/19 0207 07/11/19 1414 07/12/19 0826  NA 140 138  --  144  K 3.4* 2.9* 3.7 3.2*  CL 104 111  --  116*  CO2 21* 22  --  20*  GLUCOSE 172* 108*  --  99  BUN 20 19  --  11  CREATININE 1.51* 0.86  --  0.68  CALCIUM 8.6* 7.8*  --  8.1*  MG 1.7  --   --   --   PHOS 3.1  --   --   --    GFR: Estimated Creatinine  Clearance: 53.6 mL/min (by C-G formula based on SCr of 0.68 mg/dL). Liver Function Tests: Recent Labs  Lab 07/10/19 0227  AST 26  ALT 15  ALKPHOS 116  BILITOT 2.1*  PROT 6.3*  ALBUMIN 3.2*   No results for input(s): LIPASE, AMYLASE in the last 168 hours. No results for input(s): AMMONIA in the last 168 hours. Coagulation Profile: No results for input(s): INR, PROTIME in the last 168 hours. Cardiac Enzymes: No results for input(s): CKTOTAL, CKMB, CKMBINDEX, TROPONINI in the last 168 hours. BNP (last 3 results) No results for input(s): PROBNP in the last 8760 hours. HbA1C: Recent Labs    07/10/19 0227  HGBA1C 4.9   CBG: Recent Labs  Lab 07/11/19 1950 07/12/19 0024 07/12/19 0356 07/12/19 0801 07/12/19 1145  GLUCAP 84 93 89 96 87   Lipid Profile: No results for input(s): CHOL, HDL, LDLCALC, TRIG, CHOLHDL, LDLDIRECT in the last 72 hours. Thyroid Function Tests: No results for input(s): TSH, T4TOTAL, FREET4, T3FREE, THYROIDAB in the last 72 hours. Anemia Panel: No results for input(s): VITAMINB12, FOLATE, FERRITIN, TIBC, IRON, RETICCTPCT in the last 72 hours. Sepsis Labs: Recent Labs  Lab 07/10/19 0227  LATICACIDVEN 2.6*    Recent Results (from the past 240 hour(s))  SARS Coronavirus 2 by RT PCR (hospital order, performed in [facility] hospital lab) Nasopharyngeal Nasopharyngeal Swab     Status: None   Collection Time: 07/10/19  1:31 AM   Specimen: Nasopharyngeal Swab  Result Value Ref Range Status   SARS Coronavirus 2 NEGATIVE NEGATIVE Final    Comment: (NOTE) If result is NEGATIVE SARS-CoV-2 target nucleic acids are NOT DETECTED. The SARS-CoV-2 RNA is generally detectable in upper and lower  respiratory specimens during the acute phase of infection. The lowest  concentration of SARS-CoV-2 viral copies this assay can detect is 250  copies / mL. A negative result does not preclude SARS-CoV-2 infection  and should not be used as the sole basis for treatment or  other  patient management decisions.  A negative result may occur with  improper specimen collection / handling, submission of specimen other  than nasopharyngeal swab, presence of viral mutation(s) within the  areas targeted by this assay, and inadequate number of viral copies  (<250 copies / mL). A negative result must be combined with clinical  observations, patient history, and epidemiological information. If result is POSITIVE SARS-CoV-2 target nucleic acids are DETECTED. The SARS-CoV-2 RNA is generally detectable in upper and lower  respiratory specimens dur ing the acute phase of infection.  Positive  results are indicative of active infection with SARS-CoV-2.  Clinical  correlation with patient history and other diagnostic information is  necessary to determine patient infection status.  Positive results do  not rule out bacterial infection or co-infection with other viruses. If result is PRESUMPTIVE POSTIVE SARS-CoV-2 nucleic acids MAY BE PRESENT.   A presumptive positive result was obtained on the submitted specimen  and confirmed on repeat testing.  While 2019 novel coronavirus  (SARS-CoV-2) nucleic acids may be present in the submitted sample  additional confirmatory testing may be necessary for epidemiological  and / or clinical management purposes  to differentiate between  SARS-CoV-2 and other Sarbecovirus currently known to infect humans.  If clinically indicated additional testing with an alternate test  methodology [phone]) is advised. The SARS-CoV-2 RNA is generally  detectable in upper and lower respiratory sp ecimens during the acute  phase of infection. The expected result is Negative. Fact Sheet for Patients:  StrictlyIdeas.no Fact Sheet for Healthcare Providers: BankingDealers.co.za This test is not yet approved or cleared by the [place] FDA and has been authorized for detection and/or diagnosis of  SARS-CoV-2 by FDA under an Emergency Use Authorization (EUA).  This EUA will remain in effect (meaning this test can be used) for the duration of the COVID-19 declaration under Section 564(b)(1) of the Act, 21 U.S.C. section 360bbb-3(b)(1), unless the authorization is terminated or revoked sooner. Performed at [facility], [place] [address], [place], [place] [address]   Urine Culture     Status: None   Collection Time: 07/10/19  5:26 AM   Specimen: Urine, Cystoscope  Result Value Ref Range Status   Specimen Description   Final    CYSTOSCOPY Performed at [facility], [place] [address] [place], [place] [address]    Special Requests   Final    NONE Performed at [facility], [place] [address] [place], [place] [address]    Culture   Final    NO GROWTH Performed at [facility], [place] [address] [place], [place] [address]    Report Status 07/11/2019 FINAL  Final  MRSA PCR Screening     Status: None   Collection Time: 07/10/19  6:57 AM   Specimen: Nasal Mucosa; Nasopharyngeal  Result Value Ref Range Status   MRSA by PCR NEGATIVE NEGATIVE Final    Comment:        The GeneXpert MRSA Assay (FDA approved for NASAL specimens only), is one component of a comprehensive MRSA colonization surveillance program. It is not intended to diagnose MRSA infection nor to guide or monitor treatment for MRSA infections. Performed at [facility], [place] [address], [place], [place] [address]          Radiology Studies: Dg Knee 1-2 Views Right  Result Date: 07/12/2019 CLINICAL DATA:  Right knee pain near the fibular head. EXAM: RIGHT KNEE - 1-2 VIEW COMPARISON:  07/09/2019 FINDINGS: Mild-to-moderate tricompartmental osteoarthritis most prominent over the patellofemoral joint. Chondrocalcinosis over the medial and lateral compartments. No evidence of fracture or dislocation. No significant joint effusion. IMPRESSION: No acute  findings. Mild-to-moderate osteoarthritis. Electronically Signed   By: Marin Olp M.D.   On: 07/12/2019 09:58   Dg Chest Port 1 View  Result Date: 07/12/2019 CLINICAL DATA:  Ureteral stent placed 2 days ago. Abnormal breath sounds morning. EXAM: PORTABLE CHEST 1 VIEW COMPARISON:  07/10/2019 FINDINGS: Lungs are adequately inflated without focal lobar consolidation or effusion. Mild stable cardiomegaly. Remainder of the exam is unchanged. IMPRESSION: No acute cardiopulmonary disease. Mild stable cardiomegaly. Electronically Signed   By: Marin Olp M.D.   On: 07/12/2019 09:59        Scheduled Meds: . acetaminophen  1,000 mg Oral Once  . ARIPiprazole  2 mg Oral Daily  . atenolol  25 mg Oral Daily  . buPROPion  100 mg Oral Daily  . Chlorhexidine Gluconate Cloth  6 each Topical Daily  . enoxaparin (LOVENOX) injection  40 mg Subcutaneous Q24H  . insulin  aspart  0-9 Units Subcutaneous Q4H  . mouth rinse  15 mL Mouth Rinse BID  . PARoxetine  25 mg Oral Daily   Continuous Infusions: . sodium chloride 100 mL/hr at 07/12/19 0900  . ceFEPime (MAXIPIME) IV 2 g (07/12/19 1411)     LOS: 2 days     Cordelia Poche, MD [facility] 07/12/2019, 3:06 PM  If 7PM-7AM, please contact night-coverage [email or web address]

## 2019-07-13 ENCOUNTER — Inpatient Hospital Stay (HOSPITAL_COMMUNITY): Payer: Medicare Other

## 2019-07-13 LAB — COMPREHENSIVE METABOLIC PANEL
ALT: 16 U/L (ref 0–44)
AST: 13 U/L — ABNORMAL LOW (ref 15–41)
Albumin: 2.6 g/dL — ABNORMAL LOW (ref 3.5–5.0)
Alkaline Phosphatase: 79 U/L (ref 38–126)
Anion gap: 9 (ref 5–15)
BUN: 11 mg/dL (ref 8–23)
CO2: 21 mmol/L — ABNORMAL LOW (ref 22–32)
Calcium: 8.2 mg/dL — ABNORMAL LOW (ref 8.9–10.3)
Chloride: 116 mmol/L — ABNORMAL HIGH (ref 98–111)
Creatinine, Ser: 0.58 mg/dL (ref 0.44–1.00)
GFR calc Af Amer: >60 mL/min (ref >60)
GFR calc non Af Amer: >60 mL/min (ref >60)
Glucose, Bld: 99 mg/dL (ref 70–99)
Potassium: 3 mmol/L — ABNORMAL LOW (ref 3.5–5.1)
Sodium: 146 mmol/L — ABNORMAL HIGH (ref 135–145)
Total Bilirubin: 1.1 mg/dL (ref 0.3–1.2)
Total Protein: 5.1 g/dL — ABNORMAL LOW (ref 6.5–8.1)

## 2019-07-13 LAB — GLUCOSE, CAPILLARY
Glucose-Capillary: 87 mg/dL (ref 70–99)
Glucose-Capillary: 83 mg/dL (ref 70–99)
Glucose-Capillary: 73 mg/dL (ref 70–99)
Glucose-Capillary: 85 mg/dL (ref 70–99)
Glucose-Capillary: 105 mg/dL — ABNORMAL HIGH (ref 70–99)
Glucose-Capillary: 104 mg/dL — ABNORMAL HIGH (ref 70–99)

## 2019-07-13 MED ORDER — ALBUTEROL SULFATE (2.5 MG/3ML) 0.083% IN NEBU
2.5000 mg | INHALATION_SOLUTION | RESPIRATORY_TRACT | Status: DC | PRN
  Administered 2019-07-13: 2.5 mg via RESPIRATORY_TRACT
  Filled 2019-07-13: qty 3

## 2019-07-13 MED ORDER — DEXTROSE-NACL 5-0.45 % IV SOLN
INTRAVENOUS | Status: DC
  Administered 2019-07-13 – 2019-07-15 (×3): via INTRAVENOUS

## 2019-07-13 NOTE — Progress Notes (Signed)
MEWS/VS Documentation      07/13/2019 1000 07/13/2019 1424 07/13/2019 1827 07/13/2019 1931   MEWS Score:  -  2  0  2   MEWS Score Color:  -  Yellow  Green  Yellow   Resp:  -  17  16  17    Pulse:  -  (!) 58  (!) 54  (!) 51   BP:  -  (!) 212/75  (!) 200/72  (!) 201/66   Temp:  -  98.2 F (36.8 C)  -  97.7 F (36.5 C)   O2 Device:  Room Air  Room Air  -  Room Air     No acute changes.

## 2019-07-13 NOTE — Progress Notes (Signed)
   Vital Signs MEWS/VS Documentation      07/12/2019 1819 07/12/2019 1956 07/13/2019 0641 07/13/2019 0720   MEWS Score:  1  0  2  0   MEWS Score Color:  Green  Green  Yellow  Green   Resp:  (!) 21  16  17   -   Pulse:  (!) 55  (!) 52  (!) 55  63   BP:  (!) 179/64  (!) 188/72  (!) 212/86  (!) 167/79   Temp:  98.3 F (36.8 C)  (!) 97.5 F (36.4 C)  98 F (36.7 C)  -   O2 Device:  Room Air  Room Air  Room Air  -      Rarity Mcgowen scores yellow 2 Mews this am. Resting quietly in bed with eyes closed. No acute distress noted.Easily aroused to name. No c/o voiced, Denies CP,SOB, N/V when questioned. BP elevated at 212/86. PRN Hydrazine 10mg  IV administered. Follow-up BP 167/79. [facility] notified of the above. Will continue to monitor and assess this patient needs and/or concerns.     Nash Bolls A Jeanpierre Thebeau 07/13/2019,8:16 AM

## 2019-07-13 NOTE — Progress Notes (Signed)
   Vital Signs MEWS/VS Documentation      07/13/2019 1000 07/13/2019 1424 07/13/2019 1827 07/13/2019 1931   MEWS Score:  -  2  0  2   MEWS Score Color:  -  Yellow  Green  Yellow   Resp:  -  17  16  17    Pulse:  -  (!) 58  (!) 54  (!) 51   BP:  -  (!) 212/75  (!) 200/72  (!) 201/66   Temp:  -  98.2 F (36.8 C)  -  97.7 F (36.5 C)   O2 Device:  Room [employer]  -  Room Air      No acute changes     Norva Pavlov 07/13/2019,8:10 PM

## 2019-07-13 NOTE — Progress Notes (Signed)
PROGRESS NOTE    Danielle Dixon  [id number] DOB: 1941/01/09 DOA: 07/09/2019 PCP: Jonathon Jordan, MD   Brief Narrative: Danielle Dixon is a 78 y.o. female with a history of dementia, rheumatoid arthritis. Patient presented secondary to being found down with associated rigors. On admission, found to have renal/ureteral calculi with hydronephrosis. Patient underwent urgent bilateral ureteral stent placement. On antibiotics with urine culture pending.   Assessment & Plan:   Principal Problem:   Pyohydronephrosis Active Problems:   Sepsis ([place])   Mild dementia ([place])   Acute metabolic encephalopathy   Rheumatoid arthritis ([place])   Left ureteral calculus Right renal calculus Hydronephrosis Patient underwent urgent bilateral ureteral stent placement on admission. Cultures no growth (obtained after antibiotics). Plan for outpatient PCNL per urology. -Urology recommendations: plan to remove foley  Sepsis Secondary to above. Present on admission. Vancomycin and cefepime given in ED. No blood cultures obtained. Urine culture from cystoscopy (after antibiotics) with no growth. -Will transition to Cefdinir today; discontinue Cefepime  Dysphagia Concern for esophageal etiology per SLP evaluation since patient has delayed symptomatology.  -DG esophagram pending  Dementia Patient resides in a memory care unit per chart review -Continue home Abilify, Paxil, Wellbutrin  Acute blood loss anemia In setting of recent surgery. Mild blood seen via urinary catheter. Hemoglobin stable.  Acute metabolic encephalopathy Possibly secondary to infection and complicated by anesthesia. Appears improved today.  Agitation/delirium Likely secondary to change in environment. Improved with resumption of home medications but patient has had waxing/waning of symptoms. Seems to be better since coming out of the ICU/Stepdown unit  Pressure injury Mid sacrum, present on admission.  Rheumatoid arthritis  -Hold methotrexate secondary to infection  Knee pain Unsure of etiology. No fracture on x-ray. -supportive care with analgesics prn  Wheezing No diagnosis listed on patient chart. Possibly related to aspiration concern but patient is also on albuterol as an outpatient -Albuterol prn  DVT prophylaxis: Lovenox Code Status:   Code Status: Full Code Family Communication: None at bedside Disposition Plan: Discharge pending urology recommendations and dysphagia management   Consultants:   Urology  Procedures:   10/28: Bilateral ureteral stent placement  Antimicrobials:  Vancomycin  Cefepime    Subjective: Thirsty. No pain today.  Objective: Vitals:   07/12/19 1819 07/12/19 1956 07/13/19 0641 07/13/19 0720  BP: (!) 179/64 (!) 188/72 (!) 212/86 (!) 167/79  Pulse: (!) 55 (!) 52 (!) 55 63  Resp: (!) 21 16 17    Temp: 98.3 F (36.8 C) (!) 97.5 F (36.4 C) 98 F (36.7 C)   TempSrc: Oral Oral Oral   SpO2: 97% 95% 99%   Weight:      Height:        Intake/Output Summary (Last 24 hours) at 07/13/2019 0940 Last data filed at 07/13/2019 [id number] Gross per 24 hour  Intake 2257.03 ml  Output 45974 ml  Net -43716.97 ml   Filed Weights   07/10/19 0900  Weight: 67.9 kg    Examination:  General exam: Appears calm and comfortable Respiratory system: Expiratory rhonchi and wheezes. Respiratory effort normal. Cardiovascular system: S1 & S2 heard, RRR. No murmurs, rubs, gallops or clicks. Gastrointestinal system: Abdomen is nondistended, soft and nontender. No organomegaly or masses felt. Normal bowel sounds heard. Central nervous system: Alert and oriented to person and place. No focal neurological deficits. Extremities: No edema. No calf tenderness. Tenderness over right fibular head on lateral aspect Skin: No cyanosis. No rashes Psychiatry: Judgement and insight appear impaired. Mood & affect appropriate.  Data Reviewed: I have personally reviewed following labs and  imaging studies  CBC: Recent Labs  Lab 07/10/19 0227 07/11/19 0207 07/12/19 0826  WBC 14.2* 7.5 10.4  NEUTROABS 13.2*  --   --   HGB 14.7 10.7* 12.2  HCT 45.8 33.4* 38.5  MCV 100.0 100.0 101.3*  PLT 217 141* [id number]   Basic Metabolic Panel: Recent Labs  Lab 07/10/19 0227 07/11/19 0207 07/11/19 1414 07/12/19 0826 07/13/19 0543  NA 140 138  --  144 146*  K 3.4* 2.9* 3.7 3.2* 3.0*  CL 104 111  --  116* 116*  CO2 21* 22  --  20* 21*  GLUCOSE 172* 108*  --  99 99  BUN 20 19  --  11 11  CREATININE 1.51* 0.86  --  0.68 0.58  CALCIUM 8.6* 7.8*  --  8.1* 8.2*  MG 1.7  --   --   --   --   PHOS 3.1  --   --   --   --    GFR: Estimated Creatinine Clearance: 53.6 mL/min (by C-G formula based on SCr of 0.58 mg/dL). Liver Function Tests: Recent Labs  Lab 07/10/19 0227 07/13/19 0543  AST 26 13*  ALT 15 16  ALKPHOS 116 79  BILITOT 2.1* 1.1  PROT 6.3* 5.1*  ALBUMIN 3.2* 2.6*   No results for input(s): LIPASE, AMYLASE in the last 168 hours. No results for input(s): AMMONIA in the last 168 hours. Coagulation Profile: No results for input(s): INR, PROTIME in the last 168 hours. Cardiac Enzymes: No results for input(s): CKTOTAL, CKMB, CKMBINDEX, TROPONINI in the last 168 hours. BNP (last 3 results) No results for input(s): PROBNP in the last 8760 hours. HbA1C: No results for input(s): HGBA1C in the last 72 hours. CBG: Recent Labs  Lab 07/12/19 1540 07/12/19 2150 07/13/19 0008 07/13/19 0423 07/13/19 0722  GLUCAP 94 86 87 83 73   Lipid Profile: No results for input(s): CHOL, HDL, LDLCALC, TRIG, CHOLHDL, LDLDIRECT in the last 72 hours. Thyroid Function Tests: No results for input(s): TSH, T4TOTAL, FREET4, T3FREE, THYROIDAB in the last 72 hours. Anemia Panel: No results for input(s): VITAMINB12, FOLATE, FERRITIN, TIBC, IRON, RETICCTPCT in the last 72 hours. Sepsis Labs: Recent Labs  Lab 07/10/19 0227  LATICACIDVEN 2.6*    Recent Results (from the past 240 hour(s))   SARS Coronavirus 2 by RT PCR (hospital order, performed in [facility] hospital lab) Nasopharyngeal Nasopharyngeal Swab     Status: None   Collection Time: 07/10/19  1:31 AM   Specimen: Nasopharyngeal Swab  Result Value Ref Range Status   SARS Coronavirus 2 NEGATIVE NEGATIVE Final    Comment: (NOTE) If result is NEGATIVE SARS-CoV-2 target nucleic acids are NOT DETECTED. The SARS-CoV-2 RNA is generally detectable in upper and lower  respiratory specimens during the acute phase of infection. The lowest  concentration of SARS-CoV-2 viral copies this assay can detect is 250  copies / mL. A negative result does not preclude SARS-CoV-2 infection  and should not be used as the sole basis for treatment or other  patient management decisions.  A negative result may occur with  improper specimen collection / handling, submission of specimen other  than nasopharyngeal swab, presence of viral mutation(s) within the  areas targeted by this assay, and inadequate number of viral copies  (<250 copies / mL). A negative result must be combined with clinical  observations, patient history, and epidemiological information. If result is POSITIVE SARS-CoV-2 target nucleic acids are  DETECTED. The SARS-CoV-2 RNA is generally detectable in upper and lower  respiratory specimens dur ing the acute phase of infection.  Positive  results are indicative of active infection with SARS-CoV-2.  Clinical  correlation with patient history and other diagnostic information is  necessary to determine patient infection status.  Positive results do  not rule out bacterial infection or co-infection with other viruses. If result is PRESUMPTIVE POSTIVE SARS-CoV-2 nucleic acids MAY BE PRESENT.   A presumptive positive result was obtained on the submitted specimen  and confirmed on repeat testing.  While 2019 novel coronavirus  (SARS-CoV-2) nucleic acids may be present in the submitted sample  additional confirmatory testing  may be necessary for epidemiological  and / or clinical management purposes  to differentiate between  SARS-CoV-2 and other Sarbecovirus currently known to infect humans.  If clinically indicated additional testing with an alternate test  methodology [phone]) is advised. The SARS-CoV-2 RNA is generally  detectable in upper and lower respiratory sp ecimens during the acute  phase of infection. The expected result is Negative. Fact Sheet for Patients:  StrictlyIdeas.no Fact Sheet for Healthcare Providers: BankingDealers.co.za This test is not yet approved or cleared by the [place] FDA and has been authorized for detection and/or diagnosis of SARS-CoV-2 by FDA under an Emergency Use Authorization (EUA).  This EUA will remain in effect (meaning this test can be used) for the duration of the COVID-19 declaration under Section 564(b)(1) of the Act, 21 U.S.C. section 360bbb-3(b)(1), unless the authorization is terminated or revoked sooner. Performed at [facility], [place] [address], [place], [place] [address]   Urine Culture     Status: None   Collection Time: 07/10/19  5:26 AM   Specimen: Urine, Cystoscope  Result Value Ref Range Status   Specimen Description   Final    CYSTOSCOPY Performed at [facility], [place] [address], [place], [place] [address]    Special Requests   Final    NONE Performed at [facility], [place] [address] [place], [place] [address]    Culture   Final    NO GROWTH Performed at [facility], [place] [address], [place], [place] [address]    Report Status 07/11/2019 FINAL  Final  MRSA PCR Screening     Status: None   Collection Time: 07/10/19  6:57 AM   Specimen: Nasal Mucosa; Nasopharyngeal  Result Value Ref Range Status   MRSA by PCR NEGATIVE NEGATIVE Final    Comment:        The GeneXpert MRSA Assay (FDA approved for NASAL specimens only), is one component  of a comprehensive MRSA colonization surveillance program. It is not intended to diagnose MRSA infection nor to guide or monitor treatment for MRSA infections. Performed at [facility], [place] [address], [place], [place] [address]          Radiology Studies: Dg Knee 1-2 Views Right  Result Date: 07/12/2019 CLINICAL DATA:  Right knee pain near the fibular head. EXAM: RIGHT KNEE - 1-2 VIEW COMPARISON:  07/09/2019 FINDINGS: Mild-to-moderate tricompartmental osteoarthritis most prominent over the patellofemoral joint. Chondrocalcinosis over the medial and lateral compartments. No evidence of fracture or dislocation. No significant joint effusion. IMPRESSION: No acute findings. Mild-to-moderate osteoarthritis. Electronically Signed   By: Marin Olp M.D.   On: 07/12/2019 09:58   Dg Chest Port 1 View  Result Date: 07/12/2019 CLINICAL DATA:  Ureteral stent placed 2 days ago. Abnormal breath sounds morning. EXAM:  PORTABLE CHEST 1 VIEW COMPARISON:  07/10/2019 FINDINGS: Lungs are adequately inflated without focal lobar consolidation or effusion. Mild stable cardiomegaly. Remainder of the exam is unchanged. IMPRESSION: No acute cardiopulmonary disease. Mild stable cardiomegaly. Electronically Signed   By: Marin Olp M.D.   On: 07/12/2019 09:59        Scheduled Meds: . acetaminophen  1,000 mg Oral Once  . ARIPiprazole  2 mg Oral Daily  . atenolol  25 mg Oral Daily  . buPROPion  100 mg Oral Daily  . Chlorhexidine Gluconate Cloth  6 each Topical Daily  . enoxaparin (LOVENOX) injection  40 mg Subcutaneous Q24H  . insulin aspart  0-9 Units Subcutaneous Q4H  . mouth rinse  15 mL Mouth Rinse BID  . PARoxetine  25 mg Oral Daily   Continuous Infusions: . sodium chloride 100 mL/hr at 07/13/19 0639  . ceFEPime (MAXIPIME) IV Stopped (07/13/19 0156)  . dextrose 5 % and 0.45% NaCl       LOS: 3 days     Cordelia Poche, MD [facility] 07/13/2019, 9:40 AM  If  7PM-7AM, please contact night-coverage [email or web address]

## 2019-07-14 DIAGNOSIS — L8990 Pressure ulcer of unspecified site, unspecified stage: Secondary | ICD-10-CM | POA: Insufficient documentation

## 2019-07-14 LAB — GLUCOSE, CAPILLARY
Glucose-Capillary: 106 mg/dL — ABNORMAL HIGH (ref 70–99)
Glucose-Capillary: 107 mg/dL — ABNORMAL HIGH (ref 70–99)
Glucose-Capillary: 114 mg/dL — ABNORMAL HIGH (ref 70–99)
Glucose-Capillary: 117 mg/dL — ABNORMAL HIGH (ref 70–99)
Glucose-Capillary: 118 mg/dL — ABNORMAL HIGH (ref 70–99)
Glucose-Capillary: 121 mg/dL — ABNORMAL HIGH (ref 70–99)

## 2019-07-14 LAB — BASIC METABOLIC PANEL
Anion gap: 6 (ref 5–15)
BUN: 7 mg/dL — ABNORMAL LOW (ref 8–23)
CO2: 26 mmol/L (ref 22–32)
Calcium: 8.1 mg/dL — ABNORMAL LOW (ref 8.9–10.3)
Chloride: 108 mmol/L (ref 98–111)
Creatinine, Ser: 0.53 mg/dL (ref 0.44–1.00)
GFR calc Af Amer: >60 mL/min (ref >60)
GFR calc non Af Amer: >60 mL/min (ref >60)
Glucose, Bld: 117 mg/dL — ABNORMAL HIGH (ref 70–99)
Potassium: 2.9 mmol/L — ABNORMAL LOW (ref 3.5–5.1)
Sodium: 140 mmol/L (ref 135–145)

## 2019-07-14 MED ORDER — POTASSIUM CHLORIDE CRYS ER 10 MEQ PO TBCR
40.0000 meq | EXTENDED_RELEASE_TABLET | ORAL | Status: AC
  Administered 2019-07-14 (×2): 40 meq via ORAL
  Filled 2019-07-14 (×2): qty 4

## 2019-07-14 MED ORDER — AMLODIPINE BESYLATE 5 MG PO TABS
5.0000 mg | ORAL_TABLET | Freq: Every day | ORAL | Status: DC
  Administered 2019-07-14 – 2019-07-15 (×2): 5 mg via ORAL
  Filled 2019-07-14 (×2): qty 1

## 2019-07-14 MED ORDER — CEFDINIR 300 MG PO CAPS
300.0000 mg | ORAL_CAPSULE | Freq: Two times a day (BID) | ORAL | Status: DC
  Administered 2019-07-14 – 2019-07-17 (×7): 300 mg via ORAL
  Filled 2019-07-14 (×8): qty 1

## 2019-07-14 NOTE — Consult Note (Signed)
[facility] CONSULT Reason for consult: Dementia with dysphagia Referring Physician: [facility].  PCP: Dr. Annia Friendly is an 78 y.o. female.  HPI: She has a medical history of dementia normally is able to eat and ambulate she lives in a facility and was having difficulty was admitted with urosepsis felt to be due to pyelonephritis.  Patient had severe hydronephrosis had stents placed by urology.  She is better with antibiotics.  She has a dementia resides in a memory care unit.  Her health problems include psoriasis, prior evaluation by hematology for leukocytosis and thrombocytosis.  She herself denies any dysphagia but was noted to have some coughing after eating has been seen by speech pathology was felt that she could begin a dysphagia 3 diet and thin liquids following esophageal work-up.  Patient denies any problems but does have known dementia and is in a memory care unit. Barium swallow obtained yesterday showed no stricture or mucosal lesions.  She had delayed motility and presbyesophagus particularly in the semiupright position no reflux.  No aspiration was noted.  Were asked to see her regarding issues with her dysphagia.  Past Medical History:  Diagnosis Date  . Dementia ([place])   . Dyspnea 06/25/2013  . Leukocytosis   . Leukocytosis, unspecified 06/25/2013  . Leukocytosis, unspecified 06/25/2013  . Psoriasis   . Unspecified vitamin D deficiency 06/25/2013    Past Surgical History:  Procedure Laterality Date  . APPENDECTOMY    . CYSTOSCOPY W/ URETERAL STENT PLACEMENT Bilateral 07/10/2019   Procedure: CYSTOSCOPY WITH RETROGRADE PYELOGRAM AND BILATERAL URETERAL STENT PLACEMENT;  Surgeon: Lucas Mallow, MD;  Location: [facility];  Service: Urology;  Laterality: Bilateral;  . knee replacment Left   . TOE SURGERY      Family History  Problem Relation Age of Onset  . Cancer Mother        breast cancer  . Cancer Father        lung    Social  History:  reports that she has never smoked. She has never used smokeless tobacco. She reports that she does not drink alcohol or use drugs.  Allergies: No Known Allergies  Medications; Prior to Admission medications   Medication Sig Start Date End Date Taking? Authorizing Provider  albuterol (VENTOLIN HFA) 108 (90 Base) MCG/ACT inhaler Inhale 2 puffs into the lungs 4 (four) times daily.   Yes [provider]  ARIPiprazole (ABILIFY) 2 MG tablet Take 2 mg by mouth daily.   Yes [provider]  aspirin 81 MG tablet Take 81 mg by mouth daily.   Yes [provider]  atenolol (TENORMIN) 25 MG tablet Take 25 mg by mouth daily.   Yes [provider]  buPROPion (WELLBUTRIN SR) 100 MG 12 hr tablet Take 100 mg by mouth daily.   Yes [provider]  cholecalciferol (VITAMIN D) 1000 UNITS tablet Take 1 tablet (1,000 Units total) by mouth daily. 06/25/13  Yes Gorsuch, Ni, MD  donepezil (ARICEPT) 10 MG tablet Take 10 mg by mouth at bedtime. Unsure of dose    Yes [provider]  folic acid (FOLVITE) 1 MG tablet Take 1 mg by mouth daily.   Yes [provider]  methotrexate (RHEUMATREX) 2.5 MG tablet Take 17.5 mg by mouth every Wednesday.    Yes [provider]  PARoxetine (PAXIL-CR) 25 MG 24 hr tablet Take 25 mg by mouth daily.   Yes [provider]  simvastatin (ZOCOR) 10 MG tablet Take 10  mg by mouth at bedtime. Unsure of dose   Yes [provider]  vitamin B-12 (CYANOCOBALAMIN) 500 MCG tablet Take 500 mcg by mouth daily.   Yes [provider]  zafirlukast (ACCOLATE) 20 MG tablet Take 20 mg by mouth 2 (two) times daily.   Yes [provider]   . acetaminophen  1,000 mg Oral Once  . amLODipine  5 mg Oral Daily  . ARIPiprazole  2 mg Oral Daily  . atenolol  25 mg Oral Daily  . buPROPion  100 mg Oral Daily  . cefdinir  300 mg Oral Q12H  . Chlorhexidine Gluconate Cloth  6 each Topical Daily  .  enoxaparin (LOVENOX) injection  40 mg Subcutaneous Q24H  . insulin aspart  0-9 Units Subcutaneous Q4H  . mouth rinse  15 mL Mouth Rinse BID  . PARoxetine  25 mg Oral Daily   PRN Meds acetaminophen **OR** acetaminophen, albuterol, haloperidol **OR** haloperidol lactate, hydrALAZINE, hydrALAZINE, lip balm, ondansetron **OR** ondansetron (ZOFRAN) IV Results for orders placed or performed during the hospital encounter of 07/09/19 (from the past 48 hour(s))  Glucose, capillary     Status: None   Collection Time: 07/12/19 11:45 AM  Result Value Ref Range   Glucose-Capillary 87 70 - 99 mg/dL  Glucose, capillary     Status: None   Collection Time: 07/12/19  3:40 PM  Result Value Ref Range   Glucose-Capillary 94 70 - 99 mg/dL   Comment 1 Notify RN    Comment 2 Document in Chart   Glucose, capillary     Status: None   Collection Time: 07/12/19  9:50 PM  Result Value Ref Range   Glucose-Capillary 86 70 - 99 mg/dL  Glucose, capillary     Status: None   Collection Time: 07/13/19 12:08 AM  Result Value Ref Range   Glucose-Capillary 87 70 - 99 mg/dL   Comment 1 Notify RN    Comment 2 Document in Chart   Glucose, capillary     Status: None   Collection Time: 07/13/19  4:23 AM  Result Value Ref Range   Glucose-Capillary 83 70 - 99 mg/dL   Comment 1 Notify RN    Comment 2 Document in Chart   Comprehensive metabolic panel     Status: Abnormal   Collection Time: 07/13/19  5:43 AM  Result Value Ref Range   Sodium 146 (H) 135 - 145 mmol/L   Potassium 3.0 (L) 3.5 - 5.1 mmol/L   Chloride 116 (H) 98 - 111 mmol/L   CO2 21 (L) 22 - 32 mmol/L   Glucose, Bld 99 70 - 99 mg/dL   BUN 11 8 - 23 mg/dL   Creatinine, Ser 0.58 0.44 - 1.00 mg/dL   Calcium 8.2 (L) 8.9 - 10.3 mg/dL   Total Protein 5.1 (L) 6.5 - 8.1 g/dL   Albumin 2.6 (L) 3.5 - 5.0 g/dL   AST 13 (L) 15 - 41 U/L   ALT 16 0 - 44 U/L   Alkaline Phosphatase 79 38 - 126 U/L   Total Bilirubin 1.1 0.3 - 1.2 mg/dL   GFR calc non Af Amer >60 >60  mL/min   GFR calc Af Amer >60 >60 mL/min   Anion gap 9 5 - 15    Comment: Performed at [facility], [place] [address] [place], [place] [address]  Glucose, capillary     Status: None   Collection Time: 07/13/19  7:22 AM  Result Value Ref Range   Glucose-Capillary  73 70 - 99 mg/dL   Comment 1 Notify RN    Comment 2 Document in Chart   Glucose, capillary     Status: None   Collection Time: 07/13/19 12:16 PM  Result Value Ref Range   Glucose-Capillary 85 70 - 99 mg/dL   Comment 1 Notify RN    Comment 2 Document in Chart   Glucose, capillary     Status: Abnormal   Collection Time: 07/13/19  5:32 PM  Result Value Ref Range   Glucose-Capillary 105 (H) 70 - 99 mg/dL   Comment 1 Notify RN    Comment 2 Document in Chart   Glucose, capillary     Status: Abnormal   Collection Time: 07/13/19  8:18 PM  Result Value Ref Range   Glucose-Capillary 104 (H) 70 - 99 mg/dL  Glucose, capillary     Status: Abnormal   Collection Time: 07/14/19 12:08 AM  Result Value Ref Range   Glucose-Capillary 107 (H) 70 - 99 mg/dL  Glucose, capillary     Status: Abnormal   Collection Time: 07/14/19  4:03 AM  Result Value Ref Range   Glucose-Capillary 118 (H) 70 - 99 mg/dL  Glucose, capillary     Status: Abnormal   Collection Time: 07/14/19  7:48 AM  Result Value Ref Range   Glucose-Capillary 106 (H) 70 - 99 mg/dL    Dg Esophagus W Single Cm (sol Or Thin Ba)  Result Date: 07/13/2019 CLINICAL DATA:  Difficulty swallowing. EXAM: ESOPHOGRAM/BARIUM SWALLOW TECHNIQUE: Combined double contrast and single contrast examination performed using effervescent crystals, thick barium liquid, and thin barium liquid. FLUOROSCOPY TIME:  Fluoroscopy Time:  1 minutes and 48 seconds. Radiation Exposure Index (if provided by the fluoroscopic device): 52.1 mGy. COMPARISON:  None. FINDINGS: The esophagus demonstrates no evidence of stricture, mucosal lesion or ulceration. No hiatal hernia identified. There is some  presbyesophagus with delayed motility and some sparse tertiary contractions noted, especially in the semi upright position. No visualized gastroesophageal reflux during the procedure. 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 Electronically Signed   By: Aletta Edouard M.D.   On: 07/13/2019 12:07               Blood pressure (!) 191/81, pulse (!) 55, temperature (!) 97.5 F (36.4 C), temperature source Oral, resp. rate 18, height 5\' 3"  (1.6 m), weight 67.9 kg, SpO2 97 %.  Physical exam:   General--Pleasant female who is clearly confused thinks she is in living in [place] ENT--grossly normal Neck--supple no masses Heart--regular rate and rhythm without murmurs or gallops Lungs--clear Abdomen--soft and nontender with no masses Psych--   Assessment: 1.  Dysphagia.  The patient has clear motility issues and presbyesophagus but has done fairly well.  She has not had aspiration on the barium swallow and it appears that the speech pathology evaluation feels it is okay to begin a dysphagia 3 diet.  There is no stricture masses or any indication for dilation.  Plan: 1.  We will go ahead and begin the dysphagia 3 diet as recommended by speech pathology.  If she ends up with problems with this such as aspiration etc. we may need to consider placement of a PEG tube which would be somewhat risky in this patient with dementia.  Please let us know if we can be of any further help.   Nancy Fetter 07/14/2019, 11:14 AM   This note was created using voice recognition software and minor errors may  Have occurred unintentionally. Pager: [phone] If no answer or after hours call [phone]

## 2019-07-14 NOTE — Progress Notes (Signed)
Spoke with Dr Teryl Lucy concerning patients Foley catheter, no order for removal. Per MD, check with urology on Monday for order to remove. Will continue to monitor.
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
## 2019-07-15 LAB — GLUCOSE, CAPILLARY
Glucose-Capillary: 110 mg/dL — ABNORMAL HIGH (ref 70–99)
Glucose-Capillary: 110 mg/dL — ABNORMAL HIGH (ref 70–99)
Glucose-Capillary: 112 mg/dL — ABNORMAL HIGH (ref 70–99)
Glucose-Capillary: 129 mg/dL — ABNORMAL HIGH (ref 70–99)
Glucose-Capillary: 151 mg/dL — ABNORMAL HIGH (ref 70–99)

## 2019-07-15 MED ORDER — AMLODIPINE BESYLATE 5 MG PO TABS
5.0000 mg | ORAL_TABLET | Freq: Once | ORAL | Status: AC
  Administered 2019-07-15: 14:00:00 5 mg via ORAL
  Filled 2019-07-15: qty 1

## 2019-07-15 MED ORDER — AMLODIPINE BESYLATE 10 MG PO TABS
10.0000 mg | ORAL_TABLET | Freq: Every day | ORAL | Status: DC
  Administered 2019-07-16 – 2019-07-17 (×2): 10 mg via ORAL
  Filled 2019-07-15 (×2): qty 1

## 2019-07-15 NOTE — Care Management Important Message (Signed)
Important Message  Patient Details IM Letter given to Marney Doctor RN to present to the Patient Name: Danielle Dixon MRN: [id number] Date of Birth: [birth year]   Medicare Important Message Given:  Yes     Kerin Salen 07/15/2019, 10:31 AM

## 2019-07-15 NOTE — Progress Notes (Signed)
PROGRESS NOTE    Danielle Dixon  [id number] DOB: 1941-06-30 DOA: 07/09/2019 PCP: Jonathon Jordan, MD   Brief Narrative: Danielle Dixon is a 78 y.o. female with a history of dementia, rheumatoid arthritis. Patient presented secondary to being found down with associated rigors. On admission, found to have renal/ureteral calculi with hydronephrosis. Patient underwent urgent bilateral ureteral stent placement. On antibiotics with no growth on urine culture. Hospitalization complicated by dysphagia and delirium.   Assessment & Plan:   Principal Problem:   Pyohydronephrosis Active Problems:   Sepsis ([place])   Mild dementia ([place])   Acute metabolic encephalopathy   Rheumatoid arthritis ([facility])   Pressure injury of skin   Left ureteral calculus Right renal calculus Hydronephrosis Patient underwent urgent bilateral ureteral stent placement on admission. Cultures no growth (obtained after antibiotics). Plan for outpatient PCNL per urology. -Urology recommendations: pending re: foley  Sepsis Secondary to above. Present on admission. Vancomycin and cefepime given in ED. No blood cultures obtained. Urine culture from cystoscopy (after antibiotics) with no growth. -Cefdinir PO  Dysphagia Concern for esophageal etiology per SLP evaluation since patient has delayed symptomatology. 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 GI consulted with recommendations to start dysphagia 3 diet.  Dementia Patient resides in a memory care unit per chart review -Continue home Abilify, Paxil, Wellbutrin  Essential hypertension Uncontrolled -Continue atenolol -Increase to amlodipine 10 mg daily  Acute blood loss anemia In setting of recent surgery. Mild blood seen via urinary catheter. Hemoglobin stable.  Acute metabolic encephalopathy Possibly secondary to infection and complicated by anesthesia. Appears improved.  Agitation/delirium  Likely secondary to change in environment. Improved with resumption of home medications but patient has had waxing/waning of symptoms. Seems to be better since coming out of the ICU/Stepdown unit.  Pressure injury Mid sacrum, present on admission.  Rheumatoid arthritis -Hold methotrexate secondary to infection  Knee pain Unsure of etiology. No fracture on x-ray. -Supportive care with analgesics prn  Wheezing No diagnosis listed on patient chart. Possibly related to aspiration concern but patient is also on albuterol as an outpatient. Resolved -Albuterol prn  DVT prophylaxis: Lovenox Code Status:   Code Status: Full Code Family Communication: None at bedside Disposition Plan: Discharge pending urology recommendations and dysphagia management   Consultants:   Urology  Procedures:   10/28: Bilateral ureteral stent placement  Antimicrobials:  Vancomycin  Cefepime    Subjective: Wants to be repositioned. No other concerns.  Objective: Vitals:   07/14/19 0257 07/14/19 2152 07/15/19 0634 07/15/19 0823  BP: (!) 191/81 (!) 167/69 (!) 190/78 (!) 179/69  Pulse: (!) 55 (!) 57 (!) 59 63  Resp: 18 18 18 16   Temp: (!) 97.5 F (36.4 C) 97.9 F (36.6 C) 97.9 F (36.6 C) 97.7 F (36.5 C)  TempSrc: Oral Oral Oral Oral  SpO2: 97% 92% 94% 98%  Weight:      Height:        Intake/Output Summary (Last 24 hours) at 07/15/2019 1317 Last data filed at 07/15/2019 1000 Gross per 24 hour  Intake 240 ml  Output 902 ml  Net -662 ml   Filed Weights   07/10/19 0900  Weight: 67.9 kg    Examination:  General exam: Appears calm and comfortable Respiratory system: Clear to auscultation. Respiratory effort normal. Cardiovascular system: S1 & S2 heard, RRR. No murmurs, rubs, gallops or clicks. Gastrointestinal system: Abdomen is nondistended, soft and nontender. No organomegaly or masses felt. Normal bowel sounds heard. Central nervous system: Alert. No  focal neurological deficits.  Rhythmic mouth movements Extremities: No edema. No calf tenderness Skin: No cyanosis. No rashes Psychiatry: Judgement and insight appear impaired. Mood & affect appropriate.     Data Reviewed: I have personally reviewed following labs and imaging studies  CBC: Recent Labs  Lab 07/10/19 0227 07/11/19 0207 07/12/19 0826  WBC 14.2* 7.5 10.4  NEUTROABS 13.2*  --   --   HGB 14.7 10.7* 12.2  HCT 45.8 33.4* 38.5  MCV 100.0 100.0 101.3*  PLT 217 141* [id number]   Basic Metabolic Panel: Recent Labs  Lab 07/10/19 0227 07/11/19 0207 07/11/19 1414 07/12/19 0826 07/13/19 0543 07/14/19 1207  NA 140 138  --  144 146* 140  K 3.4* 2.9* 3.7 3.2* 3.0* 2.9*  CL 104 111  --  116* 116* 108  CO2 21* 22  --  20* 21* 26  GLUCOSE 172* 108*  --  99 99 117*  BUN 20 19  --  11 11 7*  CREATININE 1.51* 0.86  --  0.68 0.58 0.53  CALCIUM 8.6* 7.8*  --  8.1* 8.2* 8.1*  MG 1.7  --   --   --   --   --   PHOS 3.1  --   --   --   --   --    GFR: Estimated Creatinine Clearance: 53.6 mL/min (by C-G formula based on SCr of 0.53 mg/dL). Liver Function Tests: Recent Labs  Lab 07/10/19 0227 07/13/19 0543  AST 26 13*  ALT 15 16  ALKPHOS 116 79  BILITOT 2.1* 1.1  PROT 6.3* 5.1*  ALBUMIN 3.2* 2.6*   No results for input(s): LIPASE, AMYLASE in the last 168 hours. No results for input(s): AMMONIA in the last 168 hours. Coagulation Profile: No results for input(s): INR, PROTIME in the last 168 hours. Cardiac Enzymes: No results for input(s): CKTOTAL, CKMB, CKMBINDEX, TROPONINI in the last 168 hours. BNP (last 3 results) No results for input(s): PROBNP in the last 8760 hours. HbA1C: No results for input(s): HGBA1C in the last 72 hours. CBG: Recent Labs  Lab 07/14/19 1940 07/15/19 0016 07/15/19 0349 07/15/19 0800 07/15/19 1150  GLUCAP 117* 110* 112* 151* 129*   Lipid Profile: No results for input(s): CHOL, HDL, LDLCALC, TRIG, CHOLHDL, LDLDIRECT in the last 72 hours. Thyroid Function Tests: No  results for input(s): TSH, T4TOTAL, FREET4, T3FREE, THYROIDAB in the last 72 hours. Anemia Panel: No results for input(s): VITAMINB12, FOLATE, FERRITIN, TIBC, IRON, RETICCTPCT in the last 72 hours. Sepsis Labs: Recent Labs  Lab 07/10/19 0227  LATICACIDVEN 2.6*    Recent Results (from the past 240 hour(s))  SARS Coronavirus 2 by RT PCR (hospital order, performed in [facility] hospital lab) Nasopharyngeal Nasopharyngeal Swab     Status: None   Collection Time: 07/10/19  1:31 AM   Specimen: Nasopharyngeal Swab  Result Value Ref Range Status   SARS Coronavirus 2 NEGATIVE NEGATIVE Final    Comment: (NOTE) If result is NEGATIVE SARS-CoV-2 target nucleic acids are NOT DETECTED. The SARS-CoV-2 RNA is generally detectable in upper and lower  respiratory specimens during the acute phase of infection. The lowest  concentration of SARS-CoV-2 viral copies this assay can detect is 250  copies / mL. A negative result does not preclude SARS-CoV-2 infection  and should not be used as the sole basis for treatment or other  patient management decisions.  A negative result may occur with  improper specimen collection / handling, submission of specimen other  than nasopharyngeal swab,  presence of viral mutation(s) within the  areas targeted by this assay, and inadequate number of viral copies  (<250 copies / mL). A negative result must be combined with clinical  observations, patient history, and epidemiological information. If result is POSITIVE SARS-CoV-2 target nucleic acids are DETECTED. The SARS-CoV-2 RNA is generally detectable in upper and lower  respiratory specimens dur ing the acute phase of infection.  Positive  results are indicative of active infection with SARS-CoV-2.  Clinical  correlation with patient history and other diagnostic information is  necessary to determine patient infection status.  Positive results do  not rule out bacterial infection or co-infection with other viruses.  If result is PRESUMPTIVE POSTIVE SARS-CoV-2 nucleic acids MAY BE PRESENT.   A presumptive positive result was obtained on the submitted specimen  and confirmed on repeat testing.  While 2019 novel coronavirus  (SARS-CoV-2) nucleic acids may be present in the submitted sample  additional confirmatory testing may be necessary for epidemiological  and / or clinical management purposes  to differentiate between  SARS-CoV-2 and other Sarbecovirus currently known to infect humans.  If clinically indicated additional testing with an alternate test  methodology [phone]) is advised. The SARS-CoV-2 RNA is generally  detectable in upper and lower respiratory sp ecimens during the acute  phase of infection. The expected result is Negative. Fact Sheet for Patients:  StrictlyIdeas.no Fact Sheet for Healthcare Providers: BankingDealers.co.za This test is not yet approved or cleared by the [place] FDA and has been authorized for detection and/or diagnosis of SARS-CoV-2 by FDA under an Emergency Use Authorization (EUA).  This EUA will remain in effect (meaning this test can be used) for the duration of the COVID-19 declaration under Section 564(b)(1) of the Act, 21 U.S.C. section 360bbb-3(b)(1), unless the authorization is terminated or revoked sooner. Performed at [facility], [place] [address], [place], [place] [address]   Urine Culture     Status: None   Collection Time: 07/10/19  5:26 AM   Specimen: Urine, Cystoscope  Result Value Ref Range Status   Specimen Description   Final    CYSTOSCOPY Performed at [facility], [place] [address], [place], [place] [address]    Special Requests   Final    NONE Performed at [facility], [place] [address] [place], [place] [address]    Culture   Final    NO GROWTH Performed at [facility], [place] [address], [place], [place] [address]    Report Status  07/11/2019 FINAL  Final  MRSA PCR Screening     Status: None   Collection Time: 07/10/19  6:57 AM   Specimen: Nasal Mucosa; Nasopharyngeal  Result Value Ref Range Status   MRSA by PCR NEGATIVE NEGATIVE Final    Comment:        The GeneXpert MRSA Assay (FDA approved for NASAL specimens only), is one component of a comprehensive MRSA colonization surveillance program. It is not intended to diagnose MRSA infection nor to guide or monitor treatment for MRSA infections. Performed at [facility] [place] [address], [place], [place] [address]          Radiology Studies: No results found.      Scheduled Meds: . acetaminophen  1,000 mg Oral Once  . [START ON 07/16/2019] amLODipine  10 mg Oral Daily  . ARIPiprazole  2 mg Oral Daily  . atenolol  25 mg Oral Daily  . buPROPion  100 mg Oral Daily  .  cefdinir  300 mg Oral Q12H  . Chlorhexidine Gluconate Cloth  6 each Topical Daily  . enoxaparin (LOVENOX) injection  40 mg Subcutaneous Q24H  . insulin aspart  0-9 Units Subcutaneous Q4H  . mouth rinse  15 mL Mouth Rinse BID  . PARoxetine  25 mg Oral Daily   Continuous Infusions:    LOS: 5 days     Cordelia Poche, MD [facility] 07/15/2019, 1:17 PM  If 7PM-7AM, please contact night-coverage [email or web address]

## 2019-07-16 LAB — GLUCOSE, CAPILLARY
Glucose-Capillary: 107 mg/dL — ABNORMAL HIGH (ref 70–99)
Glucose-Capillary: 110 mg/dL — ABNORMAL HIGH (ref 70–99)
Glucose-Capillary: 117 mg/dL — ABNORMAL HIGH (ref 70–99)
Glucose-Capillary: 120 mg/dL — ABNORMAL HIGH (ref 70–99)
Glucose-Capillary: 121 mg/dL — ABNORMAL HIGH (ref 70–99)
Glucose-Capillary: 125 mg/dL — ABNORMAL HIGH (ref 70–99)
Glucose-Capillary: 93 mg/dL (ref 70–99)
Glucose-Capillary: 97 mg/dL (ref 70–99)

## 2019-07-16 LAB — BASIC METABOLIC PANEL
Anion gap: 9 (ref 5–15)
BUN: 8 mg/dL (ref 8–23)
CO2: 28 mmol/L (ref 22–32)
Calcium: 8.5 mg/dL — ABNORMAL LOW (ref 8.9–10.3)
Chloride: 106 mmol/L (ref 98–111)
Creatinine, Ser: 0.61 mg/dL (ref 0.44–1.00)
GFR calc Af Amer: >60 mL/min (ref >60)
GFR calc non Af Amer: >60 mL/min (ref >60)
Glucose, Bld: 125 mg/dL — ABNORMAL HIGH (ref 70–99)
Potassium: 2.9 mmol/L — ABNORMAL LOW (ref 3.5–5.1)
Sodium: 143 mmol/L (ref 135–145)

## 2019-07-16 MED ORDER — POTASSIUM CHLORIDE CRYS ER 20 MEQ PO TBCR
40.0000 meq | EXTENDED_RELEASE_TABLET | ORAL | Status: AC
  Administered 2019-07-16 (×2): 40 meq via ORAL
  Filled 2019-07-16 (×2): qty 2

## 2019-07-16 NOTE — Progress Notes (Signed)
PROGRESS NOTE    Danielle Dixon  [id number] DOB: 1940-11-18 DOA: 07/09/2019 PCP: Jonathon Jordan, MD   Brief Narrative: Danielle Dixon is a 78 y.o. female with a history of dementia, rheumatoid arthritis. Patient presented secondary to being found down with associated rigors. On admission, found to have renal/ureteral calculi with hydronephrosis. Patient underwent urgent bilateral ureteral stent placement. On antibiotics with no growth on urine culture. Hospitalization complicated by dysphagia and delirium.   Assessment & Plan:   Principal Problem:   Pyohydronephrosis Active Problems:   Sepsis ([place])   Mild dementia ([place])   Acute metabolic encephalopathy   Rheumatoid arthritis (H[facility])   Pressure injury of skin   Left ureteral calculus Right renal calculus Hydronephrosis Patient underwent urgent bilateral ureteral stent placement on admission. Cultures no growth (obtained after antibiotics). Plan for outpatient PCNL per urology. Discontinued foley per urology recommendations (11/3)  Sepsis Secondary to above. Present on admission. Vancomycin and cefepime given in ED. No blood cultures obtained. Urine culture from cystoscopy (after antibiotics) with no growth. -Cefdinir PO  Dysphagia Concern for esophageal etiology per SLP evaluation since patient has delayed symptomatology. 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 GI consulted with recommendations to start dysphagia 3 diet.  Dementia Patient resides in a memory care unit per chart review -Continue home Abilify, Paxil, Wellbutrin  Essential hypertension Slightly better controlled with increase of amlodipine -Continue atenolol -Continue amlodipine 10 mg daily  Acute blood loss anemia In setting of recent surgery. Mild blood seen via urinary catheter. Hemoglobin stable.  Acute metabolic encephalopathy Possibly secondary to infection and complicated by  anesthesia. Appears improved and is stable.  Agitation/delirium Likely secondary to change in environment. Improved with resumption of home medications but patient has had waxing/waning of symptoms. Seems to be better since coming out of the ICU/Stepdown unit.  Pressure injury Mid sacrum, present on admission.  Rheumatoid arthritis -Hold methotrexate secondary to infection  Knee pain Unsure of etiology. No fracture on x-ray. -Supportive care with analgesics prn  Wheezing No diagnosis listed on patient chart. Possibly related to aspiration concern but patient is also on albuterol as an outpatient. Resolved -Albuterol prn  DVT prophylaxis: Lovenox Code Status:   Code Status: Full Code Family Communication: None at bedside Disposition Plan: Discharge to SNF pending COVID testing   Consultants:   Urology  Procedures:   10/28: Bilateral ureteral stent placement  Antimicrobials:  Vancomycin  Cefepime    Subjective: No concerns. Wants a pen to write down phone numbers for hearing aid ads from the TV.  Objective: Vitals:   07/15/19 1355 07/15/19 2029 07/16/19 0548 07/16/19 1303  BP: (!) 179/68 (!) 157/65 (!) 170/69 (!) 153/78  Pulse: (!) 57 (!) 54 (!) 54 (!) 57  Resp: 16 17 17 17   Temp: 98.4 F (36.9 C) 98.2 F (36.8 C) 98.5 F (36.9 C) 98.4 F (36.9 C)  TempSrc: Oral Oral Oral Oral  SpO2: 94% 94% 92% 100%  Weight:      Height:        Intake/Output Summary (Last 24 hours) at 07/16/2019 1532 Last data filed at 07/16/2019 1327 Gross per 24 hour  Intake 480 ml  Output 1626 ml  Net -1146 ml   Filed Weights   07/10/19 0900  Weight: 67.9 kg    Examination:  General exam: Appears calm and comfortable Respiratory system: Clear to auscultation. Respiratory effort normal. Cardiovascular system: S1 & S2 heard, RRR. No murmurs, rubs, gallops or clicks. Gastrointestinal system: Abdomen is  nondistended, soft and nontender. No organomegaly or masses felt. Normal  bowel sounds heard. Central nervous system: Alert. Extremities: No edema. No calf tenderness Skin: No cyanosis. No rashes Psychiatry: Judgement and insight appear normal. Mood & affect appropriate.     Data Reviewed: I have personally reviewed following labs and imaging studies  CBC: Recent Labs  Lab 07/10/19 0227 07/11/19 0207 07/12/19 0826  WBC 14.2* 7.5 10.4  NEUTROABS 13.2*  --   --   HGB 14.7 10.7* 12.2  HCT 45.8 33.4* 38.5  MCV 100.0 100.0 101.3*  PLT 217 141* [id number]   Basic Metabolic Panel: Recent Labs  Lab 07/10/19 0227 07/11/19 0207 07/11/19 1414 07/12/19 0826 07/13/19 0543 07/14/19 1207 07/16/19 1207  NA 140 138  --  144 146* 140 143  K 3.4* 2.9* 3.7 3.2* 3.0* 2.9* 2.9*  CL 104 111  --  116* 116* 108 106  CO2 21* 22  --  20* 21* 26 28  GLUCOSE 172* 108*  --  99 99 117* 125*  BUN 20 19  --  11 11 7* 8  CREATININE 1.51* 0.86  --  0.68 0.58 0.53 0.61  CALCIUM 8.6* 7.8*  --  8.1* 8.2* 8.1* 8.5*  MG 1.7  --   --   --   --   --   --   PHOS 3.1  --   --   --   --   --   --    GFR: Estimated Creatinine Clearance: 53.6 mL/min (by C-G formula based on SCr of 0.61 mg/dL). Liver Function Tests: Recent Labs  Lab 07/10/19 0227 07/13/19 0543  AST 26 13*  ALT 15 16  ALKPHOS 116 79  BILITOT 2.1* 1.1  PROT 6.3* 5.1*  ALBUMIN 3.2* 2.6*   No results for input(s): LIPASE, AMYLASE in the last 168 hours. No results for input(s): AMMONIA in the last 168 hours. Coagulation Profile: No results for input(s): INR, PROTIME in the last 168 hours. Cardiac Enzymes: No results for input(s): CKTOTAL, CKMB, CKMBINDEX, TROPONINI in the last 168 hours. BNP (last 3 results) No results for input(s): PROBNP in the last 8760 hours. HbA1C: No results for input(s): HGBA1C in the last 72 hours. CBG: Recent Labs  Lab 07/15/19 2026 07/16/19 0007 07/16/19 0434 07/16/19 0753 07/16/19 1128  GLUCAP 117* 125* 107* 93 120*   Lipid Profile: No results for input(s): CHOL, HDL,  LDLCALC, TRIG, CHOLHDL, LDLDIRECT in the last 72 hours. Thyroid Function Tests: No results for input(s): TSH, T4TOTAL, FREET4, T3FREE, THYROIDAB in the last 72 hours. Anemia Panel: No results for input(s): VITAMINB12, FOLATE, FERRITIN, TIBC, IRON, RETICCTPCT in the last 72 hours. Sepsis Labs: Recent Labs  Lab 07/10/19 0227  LATICACIDVEN 2.6*    Recent Results (from the past 240 hour(s))  SARS Coronavirus 2 by RT PCR (hospital order, performed in [facility] hospital lab) Nasopharyngeal Nasopharyngeal Swab     Status: None   Collection Time: 07/10/19  1:31 AM   Specimen: Nasopharyngeal Swab  Result Value Ref Range Status   SARS Coronavirus 2 NEGATIVE NEGATIVE Final    Comment: (NOTE) If result is NEGATIVE SARS-CoV-2 target nucleic acids are NOT DETECTED. The SARS-CoV-2 RNA is generally detectable in upper and lower  respiratory specimens during the acute phase of infection. The lowest  concentration of SARS-CoV-2 viral copies this assay can detect is 250  copies / mL. A negative result does not preclude SARS-CoV-2 infection  and should not be used as the sole basis for treatment  or other  patient management decisions.  A negative result may occur with  improper specimen collection / handling, submission of specimen other  than nasopharyngeal swab, presence of viral mutation(s) within the  areas targeted by this assay, and inadequate number of viral copies  (<250 copies / mL). A negative result must be combined with clinical  observations, patient history, and epidemiological information. If result is POSITIVE SARS-CoV-2 target nucleic acids are DETECTED. The SARS-CoV-2 RNA is generally detectable in upper and lower  respiratory specimens dur ing the acute phase of infection.  Positive  results are indicative of active infection with SARS-CoV-2.  Clinical  correlation with patient history and other diagnostic information is  necessary to determine patient infection status.   Positive results do  not rule out bacterial infection or co-infection with other viruses. If result is PRESUMPTIVE POSTIVE SARS-CoV-2 nucleic acids MAY BE PRESENT.   A presumptive positive result was obtained on the submitted specimen  and confirmed on repeat testing.  While 2019 novel coronavirus  (SARS-CoV-2) nucleic acids may be present in the submitted sample  additional confirmatory testing may be necessary for epidemiological  and / or clinical management purposes  to differentiate between  SARS-CoV-2 and other Sarbecovirus currently known to infect humans.  If clinically indicated additional testing with an alternate test  methodology [phone]) is advised. The SARS-CoV-2 RNA is generally  detectable in upper and lower respiratory sp ecimens during the acute  phase of infection. The expected result is Negative. Fact Sheet for Patients:  StrictlyIdeas.no Fact Sheet for Healthcare Providers: BankingDealers.co.za This test is not yet approved or cleared by the [place] FDA and has been authorized for detection and/or diagnosis of SARS-CoV-2 by FDA under an Emergency Use Authorization (EUA).  This EUA will remain in effect (meaning this test can be used) for the duration of the COVID-19 declaration under Section 564(b)(1) of the Act, 21 U.S.C. section 360bbb-3(b)(1), unless the authorization is terminated or revoked sooner. Performed at [facility], [place] [address], [place], [place] [address]   Urine Culture     Status: None   Collection Time: 07/10/19  5:26 AM   Specimen: Urine, Cystoscope  Result Value Ref Range Status   Specimen Description   Final    CYSTOSCOPY Performed at [facility], [place] [address] [place], [place] [address]    Special Requests   Final    NONE Performed at [facility], [place] [address], [place], [place] [address]    Culture   Final    NO GROWTH Performed  at [facility], [place] [address] [place], [place] [address]    Report Status 07/11/2019 FINAL  Final  MRSA PCR Screening     Status: None   Collection Time: 07/10/19  6:57 AM   Specimen: Nasal Mucosa; Nasopharyngeal  Result Value Ref Range Status   MRSA by PCR NEGATIVE NEGATIVE Final    Comment:        The GeneXpert MRSA Assay (FDA approved for NASAL specimens only), is one component of a comprehensive MRSA colonization surveillance program. It is not intended to diagnose MRSA infection nor to guide or monitor treatment for MRSA infections. Performed at [facility], [place] [address], [place], [place] [address]          Radiology Studies: No results found.      Scheduled Meds: . acetaminophen  1,000 mg Oral Once  . amLODipine  10 mg Oral Daily  .  ARIPiprazole  2 mg Oral Daily  . atenolol  25 mg Oral Daily  . buPROPion  100 mg Oral Daily  . cefdinir  300 mg Oral Q12H  . Chlorhexidine Gluconate Cloth  6 each Topical Daily  . enoxaparin (LOVENOX) injection  40 mg Subcutaneous Q24H  . insulin aspart  0-9 Units Subcutaneous Q4H  . mouth rinse  15 mL Mouth Rinse BID  . PARoxetine  25 mg Oral Daily   Continuous Infusions:    LOS: 6 days     Cordelia Poche, MD [facility] 07/16/2019, 3:32 PM  If 7PM-7AM, please contact night-coverage [email or web address]

## 2019-07-16 NOTE — TOC Initial Note (Signed)
Transition of Care [facility]) - Initial/Assessment Note    Patient Details  Name: Danielle Dixon MRN: [id number] Date of Birth: 08/05/1941  [facility]) CM/SW Contact:    Trish Mage, LCSW Phone Number: 07/16/2019, 12:18 PM  Clinical Narrative:   Danielle Dixon is a resident of Memory Care at [facility].  She is a long term resident there, ambulates with a walker and is able to do ADL's with supervision, has visiting family members, primarily a sister.  Danielle Dixon is expecting her to return there.  Of note, they are unable to manage her if she has chronic Foley catheter in place.                  Expected Discharge Plan: Memory Care Barriers to Discharge: No Barriers Identified   Patient Goals and CMS Choice        Expected Discharge Plan and Services Expected Discharge Plan: Memory Care   Discharge Planning Services: CM Consult   Living arrangements for the past 2 months: [place]                                      Prior Living Arrangements/Services Living arrangements for the past 2 months: Day Lives with:: Facility Resident Patient language and need for interpreter reviewed:: Yes Do you feel safe going back to the place where you live?: Yes      Need for Family Participation in Patient Care: Yes (Comment) Care giver support system in place?: Yes (comment) Current home services: DME Criminal Activity/Legal Involvement Pertinent to Current Situation/Hospitalization: No - Comment as needed  Activities of Daily Living Home Assistive Devices/Equipment: Blood pressure cuff, Grab bars around toilet, Grab bars in shower, Hand-held shower hose, Walker (specify type), Hospital bed, Scales([facility] has necessary equipment for their residents) ADL Screening (condition at time of admission) Patient's cognitive ability adequate to safely complete daily activities?: No Is the patient deaf or have difficulty  hearing?: No Does the patient have difficulty seeing, even when wearing glasses/contacts?: No Does the patient have difficulty concentrating, remembering, or making decisions?: Yes Patient able to express need for assistance with ADLs?: No Does the patient have difficulty dressing or bathing?: Yes Independently performs ADLs?: No Communication: Independent Dressing (OT): Needs assistance Is this a change from baseline?: Pre-admission baseline Grooming: Needs assistance Is this a change from baseline?: Pre-admission baseline Feeding: Needs assistance Is this a change from baseline?: Pre-admission baseline Bathing: Needs assistance Is this a change from baseline?: Pre-admission baseline Toileting: Dependent Is this a change from baseline?: Change from baseline, expected to last >3days In/Out Bed: Dependent Is this a change from baseline?: Change from baseline, expected to last >3 days Walks in Home: Dependent Is this a change from baseline?: Change from baseline, expected to last >3 days Does the patient have difficulty walking or climbing stairs?: Yes(secondary to weakness and multiple falls) Weakness of Legs: Both Weakness of Arms/Hands: Both  Permission Sought/Granted                  Emotional Assessment Appearance:: Appears stated age Attitude/Demeanor/Rapport: Engaged Affect (typically observed): Calm Orientation: : Oriented to Self Alcohol / Substance Use: Not Applicable Psych Involvement: No (comment)  Admission diagnosis:  Pyohydronephrosis [N13.6] Sepsis with acute renal failure without septic shock, due to unspecified organism, unspecified acute renal failure type ([place]) [A41.9, R65.20, N17.9] Patient Active Problem List  Diagnosis Date Noted  . Pressure injury of skin 07/14/2019  . Pyohydronephrosis 07/10/2019  . Sepsis ([place]) 07/10/2019  . Mild dementia ([place]) 07/10/2019  . Acute metabolic encephalopathy [id number]  . Rheumatoid arthritis ([place]) 07/10/2019   . Leukocytosis, unspecified 06/25/2013  . Dyspnea 06/25/2013  . Thrombocytosis ([place]) 06/25/2013  . Leukocytosis, unspecified 06/25/2013  . Need for prophylactic vaccination and inoculation against influenza 06/25/2013  . Unspecified vitamin D deficiency 06/25/2013   PCP:  Jonathon Jordan, MD Pharmacy:   [facility] [address] [place] [address] Phone: [phone] Fax: [phone]     Social Determinants of Health (SDOH) Interventions    Readmission Risk Interventions No flowsheet data found.

## 2019-07-17 DIAGNOSIS — L89152 Pressure ulcer of sacral region, stage 2: Secondary | ICD-10-CM

## 2019-07-17 LAB — BASIC METABOLIC PANEL
Anion gap: 7 (ref 5–15)
BUN: 10 mg/dL (ref 8–23)
CO2: 27 mmol/L (ref 22–32)
Calcium: 8.6 mg/dL — ABNORMAL LOW (ref 8.9–10.3)
Chloride: 110 mmol/L (ref 98–111)
Creatinine, Ser: 0.62 mg/dL (ref 0.44–1.00)
GFR calc Af Amer: >60 mL/min (ref >60)
GFR calc non Af Amer: >60 mL/min (ref >60)
Glucose, Bld: 108 mg/dL — ABNORMAL HIGH (ref 70–99)
Potassium: 3.9 mmol/L (ref 3.5–5.1)
Sodium: 144 mmol/L (ref 135–145)

## 2019-07-17 LAB — GLUCOSE, CAPILLARY
Glucose-Capillary: 93 mg/dL (ref 70–99)
Glucose-Capillary: 91 mg/dL (ref 70–99)
Glucose-Capillary: 109 mg/dL — ABNORMAL HIGH (ref 70–99)

## 2019-07-17 LAB — SARS CORONAVIRUS 2 (TAT 6-24 HRS): SARS Coronavirus 2: NEGATIVE

## 2019-07-17 MED ORDER — CEFDINIR 300 MG PO CAPS: 300.0000 mg | ORAL_CAPSULE | Freq: Two times a day (BID) | ORAL | 0 refills | Status: AC

## 2019-07-17 MED ORDER — AMLODIPINE BESYLATE 10 MG PO TABS: 10.0000 mg | ORAL_TABLET | Freq: Every day | ORAL | 0 refills | Status: DC

## 2019-07-17 NOTE — Progress Notes (Signed)
SNF states they can not accept electronic prescriptions and that they must be faxed to facility at [id number]. Prescriptions written by MD and faxed to [place].

## 2019-07-17 NOTE — TOC Transition Note (Signed)
Transition of Care [facility]) - CM/SW Discharge Note   Patient Details  Name: Danielle Dixon MRN: [id number] Date of Birth: 16-Mar-1941  Transition of Care [facility]) CM/SW Contact:  Trish Mage, LCSW Phone Number: 07/17/2019, 2:10 PM   Clinical Narrative:   Patient stable for transfer back to [facility] ALF/ Memory Care.  FAXed FL2, D/C summary to [place].  Called [facility] for transport.  Nursing, call report to [phone], Beverlee Nims. Called sister to let her know.  TOC sign off.      Final next level of care: Other (comment)(ALF/Memory Care) Barriers to Discharge: No Barriers Identified   Patient Goals and CMS Choice        Discharge Placement                       Discharge Plan and Services   Discharge Planning Services: CM Consult                                 Social Determinants of Health (SDOH) Interventions     Readmission Risk Interventions No flowsheet data found.

## 2019-07-17 NOTE — NC FL2 (Signed)
[place] LEVEL OF CARE SCREENING TOOL     IDENTIFICATION  Patient Name: Danielle Dixon Birthdate: 1940-09-21 Sex: female Admission Date (Current Location): 07/09/2019  [facility] [place] Number:  Herbalist and Address:  [facility],  [place] [address], [place]      Provider Number: [id number]  Attending Physician Name and Address:  Verlee Monte, MD  Relative Name and Phone Number:  [name], [phone]    Current Level of Care: Hospital Recommended Level of Care: Memory Care, [place] Prior Approval Number:    Date Approved/Denied:   PASRR Number:    Discharge Plan: Domiciliary (Rest home)([facility])    Current Diagnoses: Patient Active Problem List   Diagnosis Date Noted  . Pressure injury of skin 07/14/2019  . Pyohydronephrosis 07/10/2019  . Sepsis ([place]) 07/10/2019  . Mild dementia ([place]) 07/10/2019  . Acute metabolic encephalopathy [id number]  . Rheumatoid arthritis ([place]) 07/10/2019  . Leukocytosis, unspecified 06/25/2013  . Dyspnea 06/25/2013  . Thrombocytosis ([place]) 06/25/2013  . Leukocytosis, unspecified 06/25/2013  . Need for prophylactic vaccination and inoculation against influenza 06/25/2013  . Unspecified vitamin D deficiency 06/25/2013    Orientation RESPIRATION BLADDER Height & Weight     Self  Normal Incontinent Weight: 67.9 kg Height:  5\' 3"  (160 cm)  BEHAVIORAL SYMPTOMS/MOOD NEUROLOGICAL BOWEL NUTRITION STATUS  (none) (none) Continent (DYS 3)  AMBULATORY STATUS COMMUNICATION OF NEEDS Skin   Limited Assist Verbally PU Stage and Appropriate Care, Surgical wounds   PU Stage 2 Dressing: (Every three days)                   Personal Care Assistance Level of Assistance  Bathing, Feeding, Dressing Bathing Assistance: Maximum assistance Feeding assistance: Limited assistance Dressing Assistance: Limited assistance     Functional Limitations Info  Sight,  Hearing, Speech Sight Info: Adequate Hearing Info: Adequate Speech Info: Adequate    SPECIAL CARE FACTORS FREQUENCY                       Contractures Contractures Info: Not present    Additional Factors Info  Code Status, Allergies Code Status Info: full Allergies Info: NKA           Current Medications (07/17/2019):      Discharge Medications: TAKE these medications   albuterol 108 (90 Base) MCG/ACT inhaler Commonly known as: VENTOLIN HFA Inhale 2 puffs into the lungs 4 (four) times daily.   amLODipine 10 MG tablet Commonly known as: NORVASC Take 1 tablet (10 mg total) by mouth daily for 7 days.   ARIPiprazole 2 MG tablet Commonly known as: ABILIFY Take 2 mg by mouth daily.   aspirin 81 MG tablet Take 81 mg by mouth daily.   atenolol 25 MG tablet Commonly known as: TENORMIN Take 25 mg by mouth daily.   buPROPion 100 MG 12 hr tablet Commonly known as: WELLBUTRIN SR Take 100 mg by mouth daily.   cefdinir 300 MG capsule Commonly known as: OMNICEF Take 1 capsule (300 mg total) by mouth every 12 (twelve) hours for 7 days.   cholecalciferol 1000 units tablet Commonly known as: VITAMIN D Take 1 tablet (1,000 Units total) by mouth daily.   donepezil 10 MG tablet Commonly known as: ARICEPT Take 10 mg by mouth at bedtime. Unsure of dose   folic acid 1 MG tablet Commonly known as: FOLVITE Take 1 mg by mouth daily.   methotrexate 2.5 MG tablet  Commonly known as: RHEUMATREX Take 17.5 mg by mouth every Wednesday.   PARoxetine 25 MG 24 hr tablet Commonly known as: PAXIL-CR Take 25 mg by mouth daily.   simvastatin 10 MG tablet Commonly known as: ZOCOR Take 10 mg by mouth at bedtime. Unsure of dose   vitamin B-12 500 MCG tablet Commonly known as: CYANOCOBALAMIN Take 500 mcg by mouth daily.   zafirlukast 20 MG tablet Commonly known as: ACCOLATE Take 20 mg by mouth 2 (two) times daily.     Relevant Imaging Results:  Relevant  Lab Results:   Additional [place], LCSW

## 2019-07-17 NOTE — Discharge Summary (Addendum)
HOSPITAL DISCHARGE SUMMARY  Danielle Dixon  MRN: [id number]  DOB:10-21-1940  Date of Admission: 07/09/2019 Date of Discharge: 07/17/2019         LOS: 7 days   Attending Physician:  Birdie Hopes Patient's PCP:  Jonathon Jordan, MD  Consults: Treatment Team:  Lucas Mallow, MD   Brief H&P dictated on discharge by admitting physician Danielle Dixon is a 78 y.o. female with medical history significant of dementia.  Baseline mental status isnt entirely clear but apparently at baseline she can hold a conversation, ambulate with walker. Patient has been unable to walk today which was abnormal for her.  At approximately20:15,staff walked in the room to give the patient her evening medications and found her on the floor side of her chair. The assessed her for wounds and placed her back in her chair Staff then checked on her again, found her on the floor in front of chair.  Was "twitching" (rigors), but eyes open and intermittently able to talk to staff.  EMS called.  Discharge Diagnoses: Present on Admission: . Pyohydronephrosis . Sepsis ([place]) . Mild dementia ([employer]) . Acute metabolic encephalopathy . Rheumatoid arthritis ([place])   Left ureteral calculus Right renal calculus Hydronephrosis Patient underwent urgent bilateral ureteral stent placement on admission. Cultures no growth (obtained after antibiotics). Plan for outpatient PCNL per urology. Discontinued foley per urology recommendations (11/3).  Sepsis Secondary to above. Present on admission. Vancomycin and cefepime given in ED.  No blood cultures obtained. Urine culture from cystoscopy (after antibiotics) with no growth. Discharged on p.o. cefdinir for 7 more days.  Dysphagia Concern for esophageal etiology per SLP evaluation since patient has delayed symptomatology. 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 GI consulted with recommendations to start  dysphagia 3 diet.  Dementia Patient resides in a memory care unit per chart review -Continue home Abilify, Paxil, Wellbutrin  Essential hypertension Slightly better controlled with increase of amlodipine -Continue atenolol -Continue amlodipine 10 mg daily  Acute blood loss anemia In setting of recent surgery. Mild blood seen via urinary catheter. Hemoglobin stable.  Acute metabolic encephalopathy Possibly secondary to infection and complicated by anesthesia. Appears improved and is stable.  Agitation/delirium Likely secondary to change in environment. Improved with resumption of home medications but patient has had waxing/waning of symptoms.  This is resolved since she was transferred out of the ICU.  Pressure injury Mid sacrum, present on admission. Stage II with partial loss of the dermis, per nursing documentation.  Rheumatoid arthritis -Hold methotrexate secondary to infection  Knee pain Unsure of etiology. No fracture on x-ray. -Supportive care with analgesics prn  Wheezing No diagnosis listed on patient chart. Possibly related to aspiration concern but patient is also on albuterol as an outpatient. Resolved This is resolved.  Allergies as of 07/17/2019   No Known Allergies     Medication List    TAKE these medications   albuterol 108 (90 Base) MCG/ACT inhaler Commonly known as: VENTOLIN HFA Inhale 2 puffs into the lungs 4 (four) times daily.   amLODipine 10 MG tablet Commonly known as: NORVASC Take 1 tablet (10 mg total) by mouth daily for 7 days.   ARIPiprazole 2 MG tablet Commonly known as: ABILIFY Take 2 mg by mouth daily.   aspirin 81 MG tablet Take 81 mg by mouth daily.   atenolol 25 MG tablet Commonly known as: TENORMIN Take 25 mg by mouth daily.   buPROPion 100 MG 12 hr tablet Commonly known as: WELLBUTRIN  SR Take 100 mg by mouth daily.   cefdinir 300 MG capsule Commonly known as: OMNICEF Take 1 capsule (300 mg total) by mouth  every 12 (twelve) hours for 7 days.   cholecalciferol 1000 units tablet Commonly known as: VITAMIN D Take 1 tablet (1,000 Units total) by mouth daily.   donepezil 10 MG tablet Commonly known as: ARICEPT Take 10 mg by mouth at bedtime. Unsure of dose   folic acid 1 MG tablet Commonly known as: FOLVITE Take 1 mg by mouth daily.   methotrexate 2.5 MG tablet Commonly known as: RHEUMATREX Take 17.5 mg by mouth every Wednesday.   PARoxetine 25 MG 24 hr tablet Commonly known as: PAXIL-CR Take 25 mg by mouth daily.   simvastatin 10 MG tablet Commonly known as: ZOCOR Take 10 mg by mouth at bedtime. Unsure of dose   vitamin B-12 500 MCG tablet Commonly known as: CYANOCOBALAMIN Take 500 mcg by mouth daily.   zafirlukast 20 MG tablet Commonly known as: ACCOLATE Take 20 mg by mouth 2 (two) times daily.       Hospital Course: Present on Admission: . Pyohydronephrosis . Sepsis ([place]) . Mild dementia ([place]) . Acute metabolic encephalopathy . Rheumatoid arthritis ([facility])   Day of Discharge BP (!) 166/61 (BP Location: Right Arm)   Pulse (!) 46   Temp (!) 97.5 F (36.4 C) (Oral)   Resp 16   Ht 5\' 3"  (1.6 m)   Wt 67.9 kg   SpO2 92%   BMI 26.52 kg/m  Physical Exam:  General: Alert, disoriented not in any acute distress.  HEENT: anicteric sclera, pupils equal reactive to light and accommodation  CVS: S1-S2 heard, no murmur rubs or gallops  Chest: clear to auscultation bilaterally, no wheezing rales or rhonchi  Abdomen: normal bowel sounds, soft, nontender, nondistended, no organomegaly  Neuro: Cranial nerves II-XII intact, no focal neurological deficits  Extremities: no cyanosis, no clubbing or edema noted bilaterally  Results for orders placed or performed during the hospital encounter of 07/09/19 (from the past 24 hour(s))  Glucose, capillary     Status: Abnormal   Collection Time: 07/16/19 11:28 AM  Result Value Ref Range   Glucose-Capillary 120 (H) 70 - 99 mg/dL    Comment 1 Notify RN    Comment 2 Document in Chart   Basic metabolic panel     Status: Abnormal   Collection Time: 07/16/19 12:07 PM  Result Value Ref Range   Sodium 143 135 - 145 mmol/L   Potassium 2.9 (L) 3.5 - 5.1 mmol/L   Chloride 106 98 - 111 mmol/L   CO2 28 22 - 32 mmol/L   Glucose, Bld 125 (H) 70 - 99 mg/dL   BUN 8 8 - 23 mg/dL   Creatinine, Ser 0.61 0.44 - 1.00 mg/dL   Calcium 8.5 (L) 8.9 - 10.3 mg/dL   GFR calc non Af Amer >60 >60 mL/min   GFR calc Af Amer >60 >60 mL/min   Anion gap 9 5 - 15  Glucose, capillary     Status: Abnormal   Collection Time: 07/16/19  4:00 PM  Result Value Ref Range   Glucose-Capillary 121 (H) 70 - 99 mg/dL   Comment 1 Notify RN    Comment 2 Document in Chart   SARS CORONAVIRUS 2 (TAT 6-24 HRS) Nasopharyngeal Nasopharyngeal Swab     Status: None   Collection Time: 07/16/19  5:15 PM   Specimen: Nasopharyngeal Swab  Result Value Ref Range   SARS Coronavirus 2  NEGATIVE NEGATIVE  Glucose, capillary     Status: Abnormal   Collection Time: 07/16/19  7:39 PM  Result Value Ref Range   Glucose-Capillary 110 (H) 70 - 99 mg/dL   Comment 1 Notify RN   Glucose, capillary     Status: None   Collection Time: 07/16/19 11:34 PM  Result Value Ref Range   Glucose-Capillary 97 70 - 99 mg/dL   Comment 1 Notify RN   Basic metabolic panel     Status: Abnormal   Collection Time: 07/17/19  4:44 AM  Result Value Ref Range   Sodium 144 135 - 145 mmol/L   Potassium 3.9 3.5 - 5.1 mmol/L   Chloride 110 98 - 111 mmol/L   CO2 27 22 - 32 mmol/L   Glucose, Bld 108 (H) 70 - 99 mg/dL   BUN 10 8 - 23 mg/dL   Creatinine, Ser 0.62 0.44 - 1.00 mg/dL   Calcium 8.6 (L) 8.9 - 10.3 mg/dL   GFR calc non Af Amer >60 >60 mL/min   GFR calc Af Amer >60 >60 mL/min   Anion gap 7 5 - 15  Glucose, capillary     Status: None   Collection Time: 07/17/19  4:58 AM  Result Value Ref Range   Glucose-Capillary 93 70 - 99 mg/dL   Comment 1 Notify RN   Glucose, capillary     Status: None    Collection Time: 07/17/19  7:37 AM  Result Value Ref Range   Glucose-Capillary 91 70 - 99 mg/dL   Comment 1 Notify RN    Comment 2 Document in Chart     Disposition: ALF   Follow-up Appts:     I spent 40 minutes completing paperwork and coordinating discharge efforts.  Signed:  Birdie Hopes 07/17/2019, 10:33 AM

## 2019-07-18 ENCOUNTER — Other Ambulatory Visit: Payer: Self-pay | Admitting: Urology

## 2019-07-24 DIAGNOSIS — R1310 Dysphagia, unspecified: Secondary | ICD-10-CM | POA: Diagnosis not present

## 2019-07-24 DIAGNOSIS — F0281 Dementia in other diseases classified elsewhere with behavioral disturbance: Secondary | ICD-10-CM | POA: Diagnosis not present

## 2019-07-24 DIAGNOSIS — J449 Chronic obstructive pulmonary disease, unspecified: Secondary | ICD-10-CM | POA: Diagnosis not present

## 2019-07-24 DIAGNOSIS — E785 Hyperlipidemia, unspecified: Secondary | ICD-10-CM | POA: Diagnosis not present

## 2019-07-24 DIAGNOSIS — K5900 Constipation, unspecified: Secondary | ICD-10-CM | POA: Diagnosis not present

## 2019-07-24 DIAGNOSIS — F419 Anxiety disorder, unspecified: Secondary | ICD-10-CM | POA: Diagnosis not present

## 2019-07-24 DIAGNOSIS — M6281 Muscle weakness (generalized): Secondary | ICD-10-CM | POA: Diagnosis not present

## 2019-07-24 DIAGNOSIS — N200 Calculus of kidney: Secondary | ICD-10-CM | POA: Diagnosis not present

## 2019-07-24 DIAGNOSIS — E559 Vitamin D deficiency, unspecified: Secondary | ICD-10-CM | POA: Diagnosis not present

## 2019-07-24 DIAGNOSIS — M069 Rheumatoid arthritis, unspecified: Secondary | ICD-10-CM | POA: Diagnosis not present

## 2019-07-24 DIAGNOSIS — D519 Vitamin B12 deficiency anemia, unspecified: Secondary | ICD-10-CM | POA: Diagnosis not present

## 2019-07-24 DIAGNOSIS — Z20828 Contact with and (suspected) exposure to other viral communicable diseases: Secondary | ICD-10-CM | POA: Diagnosis not present

## 2019-07-25 DIAGNOSIS — F39 Unspecified mood [affective] disorder: Secondary | ICD-10-CM | POA: Diagnosis not present

## 2019-07-25 DIAGNOSIS — F419 Anxiety disorder, unspecified: Secondary | ICD-10-CM | POA: Diagnosis not present

## 2019-07-25 DIAGNOSIS — F0390 Unspecified dementia without behavioral disturbance: Secondary | ICD-10-CM | POA: Diagnosis not present

## 2019-07-26 ENCOUNTER — Other Ambulatory Visit: Payer: Self-pay

## 2019-07-26 ENCOUNTER — Encounter (HOSPITAL_COMMUNITY): Payer: Self-pay | Admitting: *Deleted

## 2019-07-26 DIAGNOSIS — R54 Age-related physical debility: Secondary | ICD-10-CM | POA: Diagnosis not present

## 2019-07-26 DIAGNOSIS — Z20828 Contact with and (suspected) exposure to other viral communicable diseases: Secondary | ICD-10-CM | POA: Diagnosis not present

## 2019-07-26 DIAGNOSIS — I872 Venous insufficiency (chronic) (peripheral): Secondary | ICD-10-CM | POA: Diagnosis not present

## 2019-07-26 DIAGNOSIS — R1310 Dysphagia, unspecified: Secondary | ICD-10-CM | POA: Diagnosis not present

## 2019-07-26 DIAGNOSIS — J449 Chronic obstructive pulmonary disease, unspecified: Secondary | ICD-10-CM | POA: Diagnosis not present

## 2019-07-26 DIAGNOSIS — E559 Vitamin D deficiency, unspecified: Secondary | ICD-10-CM | POA: Diagnosis not present

## 2019-07-26 DIAGNOSIS — N200 Calculus of kidney: Secondary | ICD-10-CM | POA: Diagnosis not present

## 2019-07-26 DIAGNOSIS — F339 Major depressive disorder, recurrent, unspecified: Secondary | ICD-10-CM | POA: Diagnosis not present

## 2019-07-26 DIAGNOSIS — F419 Anxiety disorder, unspecified: Secondary | ICD-10-CM | POA: Diagnosis not present

## 2019-07-26 DIAGNOSIS — G309 Alzheimer's disease, unspecified: Secondary | ICD-10-CM | POA: Diagnosis not present

## 2019-07-26 DIAGNOSIS — M069 Rheumatoid arthritis, unspecified: Secondary | ICD-10-CM | POA: Diagnosis not present

## 2019-07-26 DIAGNOSIS — F0280 Dementia in other diseases classified elsewhere without behavioral disturbance: Secondary | ICD-10-CM | POA: Diagnosis not present

## 2019-07-26 NOTE — Progress Notes (Signed)
PCP - Jonathon Jordan Cardiologist - NONE  Chest x-ray - 07-12-19 EKG - 07-09-19 Stress Test - NONE ECHO - NONE Cardiac Cath - NONE  Sleep Study - NONE CPAP - NONE  Fasting Blood Sugar - N/A Checks Blood Sugar _____ times a day  Blood Thinner Instructions: Aspirin Instructions: Last Dose:  Anesthesia review:   Patient denies shortness of breath, fever, cough and chest pain at PAT appointment   Patient verbalized understanding of instructions that were given to them at the PAT appointment. Patient was also instructed that they will need to review over the PAT instructions again at home before surgery.  SPOKE WITH DIANE NURSE ALL FAXED INSTRUCTIONS RECEIVED AND UNDERSTOOD. SISTER POA CAROL MOORE COMING 800 AM 07-29-19 [facility] ADMITTING TO SIGN CONSENT

## 2019-07-26 NOTE — Progress Notes (Addendum)
Preop instructions for: ALEENAH JUSTUS  Date of Birth January 04, 1941                           Date of Procedure: 07-29-2019      Doctor: DR Gloriann Loan Time to arrive at [facility]: 800 AM  Report to: Admitting  Procedure:CYSTOSCOPY, LEFT RETROGRADE PYELOGRAM, LASER LITHOTRIPSY, STENT POSSIBLE, RIGHT URETEROSCOPY, LASER LITHOTRIPSY AND STENT   Do not eat or drink past midnight the night before your procedure.(To include any tube feedings-must be discontinued)    Take these morning medications only with sips of water.(or give through gastrostomy or feeding tube) DO NOT GIVE MEDS IF NEEDS MEDS CRUSHED WITH APPLESAUCE: ALBUTEROL INHALER, ATENOLOL, BUPROPION, PAROXETINE, ALPRAZOLAM IF NEEDED, ZARIFIRLUKAST (ACCOLATE).    Facility contact: [place]                   Phone:  [phone] [address]  [place] [address] [phone] HOME [phone]  Transportation contact phone#: FACILITY TO PROVIDE TRANSPORTATION [address] PHONE [phone]  Please send day of procedure:current med list and meds last taken that day, confirm nothing by mouth status from what time, Patient Demographic info( to include DNR status, problem list, allergies)    Bring Insurance card and picture ID Leave all jewelry and other valuables at place where living( no metal or rings to be worn) No contact lens Women-no make-up, no lotions,perfumes,powders Men-no colognes,lotions  Any questions day of procedure,call  SHORT STAY-[phone]   Sent from :[facility]                   Phone:[phone]                   Fax:[phone]  Sent by Zelphia Cairo     RN

## 2019-07-29 ENCOUNTER — Ambulatory Visit (HOSPITAL_COMMUNITY): Payer: Medicare Other | Admitting: Anesthesiology

## 2019-07-29 ENCOUNTER — Encounter (HOSPITAL_COMMUNITY)
Admission: RE | Disposition: A | Discharge status: Home / Self Care | Payer: Self-pay | Source: Home / Self Care | Attending: Urology

## 2019-07-29 ENCOUNTER — Ambulatory Visit (HOSPITAL_COMMUNITY)
Admission: RE | Admit: 2019-07-29 | Discharge: 2019-07-29 | Disposition: A | Discharge status: Home / Self Care | Payer: Medicare Other | Attending: Urology | Admitting: Urology

## 2019-07-29 ENCOUNTER — Encounter (HOSPITAL_COMMUNITY): Payer: Self-pay | Admitting: *Deleted

## 2019-07-29 ENCOUNTER — Ambulatory Visit (HOSPITAL_COMMUNITY): Payer: Medicare Other

## 2019-07-29 DIAGNOSIS — N200 Calculus of kidney: Secondary | ICD-10-CM | POA: Diagnosis not present

## 2019-07-29 DIAGNOSIS — Z20828 Contact with and (suspected) exposure to other viral communicable diseases: Secondary | ICD-10-CM | POA: Diagnosis not present

## 2019-07-29 DIAGNOSIS — I10 Essential (primary) hypertension: Secondary | ICD-10-CM | POA: Insufficient documentation

## 2019-07-29 DIAGNOSIS — Z87442 Personal history of urinary calculi: Secondary | ICD-10-CM | POA: Insufficient documentation

## 2019-07-29 DIAGNOSIS — M069 Rheumatoid arthritis, unspecified: Secondary | ICD-10-CM | POA: Diagnosis not present

## 2019-07-29 DIAGNOSIS — Z87440 Personal history of urinary (tract) infections: Secondary | ICD-10-CM | POA: Insufficient documentation

## 2019-07-29 DIAGNOSIS — F0390 Unspecified dementia without behavioral disturbance: Secondary | ICD-10-CM | POA: Diagnosis not present

## 2019-07-29 DIAGNOSIS — N201 Calculus of ureter: Secondary | ICD-10-CM | POA: Diagnosis not present

## 2019-07-29 DIAGNOSIS — J449 Chronic obstructive pulmonary disease, unspecified: Secondary | ICD-10-CM | POA: Insufficient documentation

## 2019-07-29 DIAGNOSIS — A419 Sepsis, unspecified organism: Secondary | ICD-10-CM | POA: Diagnosis not present

## 2019-07-29 HISTORY — PX: CYSTOSCOPY/URETEROSCOPY/HOLMIUM LASER/STENT PLACEMENT: SHX6546

## 2019-07-29 HISTORY — DX: Depression, unspecified: F32.A

## 2019-07-29 HISTORY — DX: Constipation, unspecified: K59.00

## 2019-07-29 HISTORY — DX: Unspecified osteoarthritis, unspecified site: M19.90

## 2019-07-29 HISTORY — DX: Anxiety disorder, unspecified: F41.9

## 2019-07-29 HISTORY — DX: Chronic obstructive pulmonary disease, unspecified: J44.9

## 2019-07-29 LAB — BASIC METABOLIC PANEL
Anion gap: 8 (ref 5–15)
BUN: 11 mg/dL (ref 8–23)
CO2: 27 mmol/L (ref 22–32)
Calcium: 8.9 mg/dL (ref 8.9–10.3)
Chloride: 107 mmol/L (ref 98–111)
Creatinine, Ser: 0.83 mg/dL (ref 0.44–1.00)
GFR calc Af Amer: >60 mL/min (ref >60)
GFR calc non Af Amer: >60 mL/min (ref >60)
Glucose, Bld: 101 mg/dL — ABNORMAL HIGH (ref 70–99)
Potassium: 4 mmol/L (ref 3.5–5.1)
Sodium: 142 mmol/L (ref 135–145)

## 2019-07-29 LAB — CBC
HCT: 42.2 % (ref 36.0–46.0)
Hemoglobin: 12.9 g/dL (ref 12.0–15.0)
MCH: 31.2 pg (ref 26.0–34.0)
MCHC: 30.6 g/dL (ref 30.0–36.0)
MCV: 101.9 fL — ABNORMAL HIGH (ref 80.0–100.0)
Platelets: 468 10*3/uL — ABNORMAL HIGH (ref 150–400)
RBC: 4.14 MIL/uL (ref 3.87–5.11)
RDW: 14 % (ref 11.5–15.5)
WBC: 13.5 10*3/uL — ABNORMAL HIGH (ref 4.0–10.5)
nRBC: 0 % (ref 0.0–0.2)

## 2019-07-29 LAB — SARS CORONAVIRUS 2 BY RT PCR (HOSPITAL ORDER, PERFORMED IN ~~LOC~~ HOSPITAL LAB): SARS Coronavirus 2: NEGATIVE

## 2019-07-29 LAB — GLUCOSE, CAPILLARY: Glucose-Capillary: 102 mg/dL — ABNORMAL HIGH (ref 70–99)

## 2019-07-29 SURGERY — CYSTOSCOPY/URETEROSCOPY/HOLMIUM LASER/STENT PLACEMENT
Anesthesia: General | Laterality: Bilateral

## 2019-07-29 MED ORDER — DEXAMETHASONE SODIUM PHOSPHATE 10 MG/ML IJ SOLN
INTRAMUSCULAR | Status: AC
  Filled 2019-07-29: qty 1

## 2019-07-29 MED ORDER — LACTATED RINGERS IV SOLN
INTRAVENOUS | Status: DC
  Administered 2019-07-29: 10:00:00 via INTRAVENOUS

## 2019-07-29 MED ORDER — FENTANYL CITRATE (PF) 100 MCG/2ML IJ SOLN
INTRAMUSCULAR | Status: AC
  Filled 2019-07-29: qty 2

## 2019-07-29 MED ORDER — LIDOCAINE 2% (20 MG/ML) 5 ML SYRINGE
INTRAMUSCULAR | Status: AC
  Filled 2019-07-29: qty 5

## 2019-07-29 MED ORDER — CEFAZOLIN SODIUM-DEXTROSE 2-4 GM/100ML-% IV SOLN
2.0000 g | INTRAVENOUS | Status: AC
  Administered 2019-07-29: 2 g via INTRAVENOUS
  Filled 2019-07-29: qty 100

## 2019-07-29 MED ORDER — ONDANSETRON HCL 4 MG/2ML IJ SOLN
INTRAMUSCULAR | Status: AC
  Filled 2019-07-29: qty 2

## 2019-07-29 MED ORDER — EPHEDRINE SULFATE 50 MG/ML IJ SOLN
INTRAMUSCULAR | Status: DC | PRN
  Administered 2019-07-29: 5 mg via INTRAVENOUS

## 2019-07-29 MED ORDER — DEXAMETHASONE SODIUM PHOSPHATE 10 MG/ML IJ SOLN
INTRAMUSCULAR | Status: DC | PRN
  Administered 2019-07-29: 8 mg via INTRAVENOUS

## 2019-07-29 MED ORDER — FENTANYL CITRATE (PF) 100 MCG/2ML IJ SOLN: 25.0000 ug | INTRAMUSCULAR | Status: DC | PRN

## 2019-07-29 MED ORDER — FENTANYL CITRATE (PF) 100 MCG/2ML IJ SOLN
INTRAMUSCULAR | Status: DC | PRN
  Administered 2019-07-29: 50 ug via INTRAVENOUS
  Administered 2019-07-29 (×2): 25 ug via INTRAVENOUS

## 2019-07-29 MED ORDER — SODIUM CHLORIDE 0.9 % IR SOLN
Status: DC | PRN
  Administered 2019-07-29: 2000 mL

## 2019-07-29 MED ORDER — LIDOCAINE 2% (20 MG/ML) 5 ML SYRINGE
INTRAMUSCULAR | Status: DC | PRN
  Administered 2019-07-29: 60 mg via INTRAVENOUS

## 2019-07-29 MED ORDER — ONDANSETRON HCL 4 MG/2ML IJ SOLN
INTRAMUSCULAR | Status: DC | PRN
  Administered 2019-07-29: 4 mg via INTRAVENOUS

## 2019-07-29 MED ORDER — PROPOFOL 10 MG/ML IV BOLUS
INTRAVENOUS | Status: DC | PRN
  Administered 2019-07-29: 20 mg via INTRAVENOUS
  Administered 2019-07-29: 80 mg via INTRAVENOUS
  Administered 2019-07-29: 20 mg via INTRAVENOUS

## 2019-07-29 MED ORDER — PROMETHAZINE HCL 25 MG/ML IJ SOLN: 6.2500 mg | INTRAMUSCULAR | Status: DC | PRN

## 2019-07-29 MED ORDER — IOHEXOL 300 MG/ML  SOLN
INTRAMUSCULAR | Status: DC | PRN
  Administered 2019-07-29: 50 mL

## 2019-07-29 MED ORDER — PROPOFOL 10 MG/ML IV BOLUS
INTRAVENOUS | Status: AC
  Filled 2019-07-29: qty 20

## 2019-07-29 SURGICAL SUPPLY — 20 items
BAG URO CATCHER STRL LF (MISCELLANEOUS) ×3 IMPLANT
BASKET LASER NITINOL 1.9FR (BASKET) IMPLANT
BASKET ZERO TIP NITINOL 2.4FR (BASKET) IMPLANT
CATH INTERMIT  6FR 70CM (CATHETERS) ×3 IMPLANT
CLOTH BEACON ORANGE TIMEOUT ST (SAFETY) ×3 IMPLANT
EXTRACTOR STONE 1.7FRX115CM (UROLOGICAL SUPPLIES) IMPLANT
FIBER LASER FLEXIVA 365 (UROLOGICAL SUPPLIES) IMPLANT
FIBER LASER TRAC TIP (UROLOGICAL SUPPLIES) ×3 IMPLANT
GLOVE BIO SURGEON STRL SZ7.5 (GLOVE) ×3 IMPLANT
GOWN STRL REUS W/TWL XL LVL3 (GOWN DISPOSABLE) ×3 IMPLANT
GUIDEWIRE ANG ZIPWIRE 038X150 (WIRE) IMPLANT
GUIDEWIRE STR DUAL SENSOR (WIRE) ×6 IMPLANT
KIT TURNOVER KIT A (KITS) ×3 IMPLANT
MANIFOLD NEPTUNE II (INSTRUMENTS) ×3 IMPLANT
PACK CYSTO (CUSTOM PROCEDURE TRAY) ×3 IMPLANT
SHEATH URETERAL 12FRX28CM (UROLOGICAL SUPPLIES) IMPLANT
SHEATH URETERAL 12FRX35CM (MISCELLANEOUS) ×3 IMPLANT
TUBING CONNECTING 10 (TUBING) ×2 IMPLANT
TUBING CONNECTING 10' (TUBING) ×1
TUBING UROLOGY SET (TUBING) ×3 IMPLANT

## 2019-07-29 NOTE — Discharge Instructions (Signed)

## 2019-07-29 NOTE — Interval H&P Note (Signed)
History and Physical Interval Note:  07/29/2019 10:39 AM  Danielle Dixon  has presented today for surgery, with the diagnosis of BILATERAL RENAL CALCULI.  The various methods of treatment have been discussed with the patient and family. After consideration of risks, benefits and other options for treatment, the patient has consented to  Procedure(s): CYSTOSCOPY LEFT RETROGRADE PYELOGRAM LEFT URETEROSCOPY HOLMIUM LASER LEFT STENT PLACEMENT POSSIBLE RIGHT URETEROSCOPY LASER LITHOTRIPSY AND STENT (Bilateral) as a surgical intervention.  The patient's history has been reviewed, patient examined, no change in status, stable for surgery.  I have reviewed the patient's chart and labs.  Questions were answered to the patient's satisfaction.     [name], III

## 2019-07-29 NOTE — Anesthesia Preprocedure Evaluation (Addendum)
Anesthesia Evaluation  Patient identified by MRN, date of birth, ID band Patient confused    Reviewed: Allergy & Precautions, NPO status , Patient's Chart, lab work & pertinent test results, Unable to perform ROS - Chart review only  Airway Mallampati: II  TM Distance: >3 FB Neck ROM: Limited  Mouth opening: Limited Mouth Opening  Dental no notable dental hx.    Pulmonary COPD,    Pulmonary exam normal breath sounds clear to auscultation       Cardiovascular hypertension, Pt. on medications and Pt. on home beta blockers Normal cardiovascular exam Rhythm:Regular Rate:Normal     Neuro/Psych Dementia negative neurological ROS     GI/Hepatic negative GI ROS, Neg liver ROS,   Endo/Other  negative endocrine ROS  Renal/GU negative Renal ROS  negative genitourinary   Musculoskeletal  (+) Arthritis , Rheumatoid disorders,    Abdominal   Peds negative pediatric ROS (+)  Hematology negative hematology ROS (+)   Anesthesia Other Findings   Reproductive/Obstetrics negative OB ROS                             Anesthesia Physical Anesthesia Plan  ASA: III  Anesthesia Plan: General   Post-op Pain Management:    Induction: Intravenous  PONV Risk Score and Plan: 3 and Ondansetron, Dexamethasone and Treatment may vary due to age or medical condition  Airway Management Planned: LMA  Additional Equipment:   Intra-op Plan:   Post-operative Plan: Extubation in OR  Informed Consent: I have reviewed the patients History and Physical, chart, labs and discussed the procedure including the risks, benefits and alternatives for the proposed anesthesia with the patient or authorized representative who has indicated his/her understanding and acceptance.     Dental advisory given  Plan Discussed with: CRNA and Surgeon  Anesthesia Plan Comments:        Anesthesia Quick Evaluation

## 2019-07-29 NOTE — Anesthesia Postprocedure Evaluation (Signed)
Anesthesia Post Note  Patient: [name]  Procedure(s) Performed: CYSTOSCOPY LEFT RETROGRADE PYELOGRAM LEFT URETEROSCOPY HOLMIUM LASER LEFT STENT PLACEMENT POSSIBLE RIGHT URETEROSCOPY LASER LITHOTRIPSY AND STENT (Bilateral )     Patient location during evaluation: PACU Anesthesia Type: General Level of consciousness: awake and alert Pain management: pain level controlled Vital Signs Assessment: post-procedure vital signs reviewed and stable Respiratory status: spontaneous breathing, nonlabored ventilation, respiratory function stable and patient connected to nasal cannula oxygen Cardiovascular status: blood pressure returned to baseline and stable Postop Assessment: no apparent nausea or vomiting Anesthetic complications: no    Last Vitals:  Vitals:   07/29/19 1305 07/29/19 1335  BP: (!) 161/74 (!) 168/76  Pulse: (!) 59 (!) 56  Resp: 16 16  Temp: 36.6 C   SpO2: 95% 97%    Last Pain:  Vitals:   07/29/19 1335  TempSrc:   PainSc: 0-No pain                 Laderrick Wilk S

## 2019-07-29 NOTE — Anesthesia Procedure Notes (Signed)
Procedure Name: LMA Insertion Date/Time: 07/29/2019 11:03 AM Performed by: Glory Buff, CRNA Pre-anesthesia Checklist: Patient identified, Emergency Drugs available, Suction available and Patient being monitored Patient Re-evaluated:Patient Re-evaluated prior to induction Oxygen Delivery Method: Circle system utilized Preoxygenation: Pre-oxygenation with 100% oxygen Induction Type: IV induction Ventilation: Mask ventilation without difficulty LMA: LMA inserted LMA Size: 4.0 Number of attempts: 1 Placement Confirmation: positive ETCO2 Tube secured with: Tape Dental Injury: Teeth and Oropharynx as per pre-operative assessment

## 2019-07-29 NOTE — Transfer of Care (Signed)
Immediate Anesthesia Transfer of Care Note  Patient: [name]  Procedure(s) Performed: CYSTOSCOPY LEFT RETROGRADE PYELOGRAM LEFT URETEROSCOPY HOLMIUM LASER LEFT STENT PLACEMENT POSSIBLE RIGHT URETEROSCOPY LASER LITHOTRIPSY AND STENT (Bilateral )  Patient Location: PACU  Anesthesia Type:General  Level of Consciousness: drowsy, patient cooperative and responds to stimulation  Airway & Oxygen Therapy: Patient Spontanous Breathing and Patient connected to face mask oxygen  Post-op Assessment: Report given to RN and Post -op Vital signs reviewed and stable  Post vital signs: Reviewed and stable  Last Vitals:  Vitals Value Taken Time  BP    Temp    Pulse 54 07/29/19 1159  Resp 22 07/29/19 1159  SpO2 100 % 07/29/19 1159  Vitals shown include unvalidated device data.  Last Pain:  Vitals:   07/29/19 1000  TempSrc:   PainSc: 0-No pain         Complications: No apparent anesthesia complications

## 2019-07-29 NOTE — Progress Notes (Signed)
Pt urinated at this time. Pink and clear.

## 2019-07-29 NOTE — Op Note (Signed)
Operative Note  Preoperative diagnosis:  1.  Bilateral renal calculi  Postoperative diagnosis: 1.  Bilateral renal calculi  Procedure(s): 1.  Cystoscopy with left ureteroscopy, laser lithotripsy, ureteral stent exchange  Surgeon: Link Snuffer, MD  Assistants: None  Anesthesia: General  Complications: None immediate  EBL: Minimal  Specimens: 1.  None  Drains/Catheters: 1.  6 x 24 double-J ureteral stent  Intraoperative findings: 1.  Normal urethra and bladder 2.  Appropriately positioned right ureteral stent at the beginning and at the end of the case. 3.  1 cm left ureteropelvic junction calculus fragmented to tiny fragments  Indication: 78 year old female status post urgent ureteral stent placement for an obstructing left ureteral calculus and UTI presents for the previously mentioned operation.  Description of procedure:  The patient was identified and consent was obtained.  The patient was taken to the operating room and placed in the supine position.  The patient was placed under general anesthesia.  Perioperative antibiotics were administered.  The patient was placed in dorsal lithotomy.  Patient was prepped and draped in a standard sterile fashion and a timeout was performed.  A 21 French rigid cystoscope was advanced into the urethra and into the bladder.  The left stent was grasped and pulled just beyond the urethral meatus.  A sensor wire was advanced through this under fluoroscopic guidance into the kidney and the stent was withdrawn.  A ureteral access sheath 12 x 14 was advanced over the wire under continuous fluoroscopic guidance up the ureter.  A second wire was advanced through this and the sheath was removed.  One of the wires was secured to a drape as a safety wire.  The wire was used to readvanced the access sheath over the wire and up the ureter under continuous fluoroscopic guidance up to the level of the renal pelvis.  The inner sheath and the wire were  withdrawn.  A digital ureteroscope was advanced up to the stone of interest which was fragmented to tiny fragments with a laser fiber.  The entire kidney was inspected and no clinically significant stone fragments were seen.  I then withdrew the scope along with the access sheath visualizing the entire ureter upon removal.  There were no ureteral calculi and no significant ureteral injury.  I backloaded wire onto a rigid cystoscope which was advanced into the bladder.  I then routinely placed a 6 x 24 double-J ureteral stent.  Fluoroscopy confirmed proximal placement and direct visualization confirmed a good coil within the bladder.  I drained the bladder and withdrew the scope.  Patient tolerated procedure well was stable postoperatively.  Plan: Plan for right PCNL at a later date.

## 2019-07-30 ENCOUNTER — Encounter (HOSPITAL_COMMUNITY): Payer: Self-pay | Admitting: Urology

## 2019-08-01 ENCOUNTER — Other Ambulatory Visit (HOSPITAL_COMMUNITY): Payer: Self-pay | Admitting: Urology

## 2019-08-01 ENCOUNTER — Other Ambulatory Visit: Payer: Self-pay | Admitting: Urology

## 2019-08-01 DIAGNOSIS — E559 Vitamin D deficiency, unspecified: Secondary | ICD-10-CM | POA: Diagnosis not present

## 2019-08-01 DIAGNOSIS — N200 Calculus of kidney: Secondary | ICD-10-CM

## 2019-08-01 DIAGNOSIS — D519 Vitamin B12 deficiency anemia, unspecified: Secondary | ICD-10-CM | POA: Diagnosis not present

## 2019-08-01 DIAGNOSIS — F0281 Dementia in other diseases classified elsewhere with behavioral disturbance: Secondary | ICD-10-CM | POA: Diagnosis not present

## 2019-08-20 ENCOUNTER — Encounter (HOSPITAL_COMMUNITY): Payer: Self-pay | Admitting: Physician Assistant

## 2019-08-20 DIAGNOSIS — F39 Unspecified mood [affective] disorder: Secondary | ICD-10-CM | POA: Diagnosis not present

## 2019-08-20 DIAGNOSIS — F419 Anxiety disorder, unspecified: Secondary | ICD-10-CM | POA: Diagnosis not present

## 2019-08-20 DIAGNOSIS — F0390 Unspecified dementia without behavioral disturbance: Secondary | ICD-10-CM | POA: Diagnosis not present

## 2019-08-25 DIAGNOSIS — R54 Age-related physical debility: Secondary | ICD-10-CM | POA: Diagnosis not present

## 2019-08-25 DIAGNOSIS — F339 Major depressive disorder, recurrent, unspecified: Secondary | ICD-10-CM | POA: Diagnosis not present

## 2019-08-25 DIAGNOSIS — E559 Vitamin D deficiency, unspecified: Secondary | ICD-10-CM | POA: Diagnosis not present

## 2019-08-25 DIAGNOSIS — Z20828 Contact with and (suspected) exposure to other viral communicable diseases: Secondary | ICD-10-CM | POA: Diagnosis not present

## 2019-08-25 DIAGNOSIS — M069 Rheumatoid arthritis, unspecified: Secondary | ICD-10-CM | POA: Diagnosis not present

## 2019-08-25 DIAGNOSIS — N200 Calculus of kidney: Secondary | ICD-10-CM | POA: Diagnosis not present

## 2019-08-25 DIAGNOSIS — R1310 Dysphagia, unspecified: Secondary | ICD-10-CM | POA: Diagnosis not present

## 2019-08-25 DIAGNOSIS — J449 Chronic obstructive pulmonary disease, unspecified: Secondary | ICD-10-CM | POA: Diagnosis not present

## 2019-08-25 DIAGNOSIS — F419 Anxiety disorder, unspecified: Secondary | ICD-10-CM | POA: Diagnosis not present

## 2019-08-25 DIAGNOSIS — F0280 Dementia in other diseases classified elsewhere without behavioral disturbance: Secondary | ICD-10-CM | POA: Diagnosis not present

## 2019-08-25 DIAGNOSIS — G309 Alzheimer's disease, unspecified: Secondary | ICD-10-CM | POA: Diagnosis not present

## 2019-08-25 DIAGNOSIS — I872 Venous insufficiency (chronic) (peripheral): Secondary | ICD-10-CM | POA: Diagnosis not present

## 2019-08-28 ENCOUNTER — Encounter (HOSPITAL_COMMUNITY): Payer: Self-pay | Admitting: Urology

## 2019-08-28 NOTE — Progress Notes (Addendum)
Preop instructions for:   Danielle Dixon                      Date of Birth -11-28-1940                         Date of Procedure: Monday 09/02/2019       Doctor: Dr. Link Snuffer  Time to arrive at [place] am  Report to: Radiology Department at [facility]   Procedure:Nephrolithotomy [place]   Do not eat or drink past midnight the night before your procedure.(To include any tube feedings-must be discontinued)    Take these morning medications only with sips of water.(or give through gastrostomy or feeding tube). Atenolol (Tenormin), Paroxetine (Paxil-CR), Zafirlukast (Accolate),  Use Alprazolam (Xanax) if needed, use Albuterol inhaler if needed and bring inhale rwith you to the hospital.  Note: No Insulin or Diabetic meds should be given or taken the morning of the procedure!   Facility contact:  [place]                 Phone: [phone]      Fax:[phone]              Health Care POA: sister- [name] Home phone-[phone]  [phone]  Transportation contact phone#:Facility to provide transportation -[facility]  [phone]  Please send day of procedure:current med list and meds last taken that day, confirm nothing by mouth status from what time, Patient Demographic info( to include DNR status, problem list, allergies)   RN contact name/phone#:  Fayrene Helper, Med Tech [phone]    and Fax [phone]  [place] card and picture ID Leave all jewelry and other valuables at place where living( no metal or rings to be worn) No contact lens Women-no make-up, no lotions,perfumes,powders Men-no colognes,lotions  Any questions day of procedure,call  SHORT STAY-[phone]   Sent from :[facility]                   [place]                   Fax:[phone]  Sent by : Charmaine Downs. Trudee Grip, BSN ,RN

## 2019-08-28 NOTE — Progress Notes (Signed)
[place] and spoke to [place], [place].Tech person and requested MAR, Progress note, POA information and any recent lab work to be Faxed to me.

## 2019-08-28 NOTE — Progress Notes (Signed)
PCP - Dr. Jonathon Jordan Cardiologist - n/a  Chest x-ray - 07/12/2019 EPIC EKG - 07/09/2019  1 view EPIC Stress Test - n/a ECHO - n/a Cardiac Cath n/a-   Sleep Study - n/a CPAP -n/a   Fasting Blood Sugar -n/a  Checks Blood Sugar _____ times a day  Blood Thinner Instructions:n/a Aspirin Instructions:n/a Last Dose:n/a  Anesthesia review:  Patient has a history of Rheumatoid Arthritis, Mild Dementia , kidney stones and COPD.  Patient denies shortness of breath, fever, cough and chest pain at PAT appointment   Patient verbalized understanding of instructions that were given to them at the PAT appointment. Patient was also instructed that they will need to review over the PAT instructions again at home before surgery.

## 2019-08-30 NOTE — Progress Notes (Signed)
Covid test results on chart from [facility] done on 08/29/2019.

## 2019-08-30 NOTE — Progress Notes (Signed)
[place] at [phone] to confirm that the nurse received the patient's pre-op instructions for surgery on 09/02/2019. I spoke to [place], the Traveling nurse who informed me that they have an outbreak of Covid in their facility. The patient will come by ambulance to [facility] and report to Radiology at 0800 am. I asked Gerald Stabs, the nurse what her Covid test results from 08/29/2019 was and she quoted that she was "negative". I asked Gerald Stabs, the nurse to Fax me the result for Anesthesia to see she was negative, and she responded that all of the results are on a spread sheet with all the resident names on it and they sent it to the Health Department.  She stated she  cannot Fax that sheet to me with all the resident names on it but she would write up a letter saying that the patient  got tested and was negative and Fax it to me. Called Darrel Reach, scheduler for Dr. Gloriann Loan to return call about patient.

## 2019-09-02 ENCOUNTER — Ambulatory Visit (HOSPITAL_COMMUNITY): Admission: RE | Admit: 2019-09-02 | Payer: Medicare Other | Source: Home / Self Care | Admitting: Urology

## 2019-09-02 ENCOUNTER — Ambulatory Visit (HOSPITAL_COMMUNITY): Payer: Medicare Other

## 2019-09-02 ENCOUNTER — Other Ambulatory Visit: Payer: Self-pay | Admitting: Radiology

## 2019-09-02 ENCOUNTER — Encounter (HOSPITAL_COMMUNITY): Admission: RE | Payer: Self-pay | Source: Home / Self Care

## 2019-09-02 ENCOUNTER — Inpatient Hospital Stay (HOSPITAL_COMMUNITY): Admission: RE | Admit: 2019-09-02 | Payer: Medicare Other | Source: Ambulatory Visit

## 2019-09-02 HISTORY — DX: Dementia in other diseases classified elsewhere, unspecified severity, without behavioral disturbance, psychotic disturbance, mood disturbance, and anxiety: G30.9

## 2019-09-02 HISTORY — DX: Dementia in other diseases classified elsewhere, unspecified severity, without behavioral disturbance, psychotic disturbance, mood disturbance, and anxiety: F02.80

## 2019-09-02 SURGERY — NEPHROLITHOTOMY PERCUTANEOUS
Anesthesia: General | Laterality: Right

## 2019-09-02 MED ORDER — LIDOCAINE 2% (20 MG/ML) 5 ML SYRINGE
INTRAMUSCULAR | Status: AC
  Filled 2019-09-02: qty 5

## 2019-09-02 MED ORDER — ROCURONIUM BROMIDE 10 MG/ML (PF) SYRINGE
PREFILLED_SYRINGE | INTRAVENOUS | Status: AC
  Filled 2019-09-02: qty 10

## 2019-09-02 MED ORDER — PROPOFOL 10 MG/ML IV BOLUS
INTRAVENOUS | Status: AC
  Filled 2019-09-02: qty 20

## 2019-09-02 MED ORDER — FENTANYL CITRATE (PF) 100 MCG/2ML IJ SOLN
INTRAMUSCULAR | Status: AC
  Filled 2019-09-02: qty 2

## 2019-09-02 NOTE — Progress Notes (Signed)
Pt was to arrive at 0800 this am for IR and OR.  No show by [id number]  No answer, Message Left @ [phone]  [phone]  [name] (Sister) looking for patient as well, also getting no answer @0832   [phone] "Erline Levine RN" per [name]  No answer, message left @0833   [phone] "Lobby number" per [name]. Per answering person- hasnt left buildilg yet, stated bus isnt here yet. Our note states [facility] transport. They are calling [facility] and calling back regarding time.  @0835   Med Tech called RN-- Simeon Craft RN [phone] ) states transportation not arranged and will have to be rescheduled. @0841 

## 2019-09-09 ENCOUNTER — Other Ambulatory Visit: Payer: Self-pay | Admitting: Urology

## 2019-09-10 ENCOUNTER — Other Ambulatory Visit (HOSPITAL_COMMUNITY): Payer: Self-pay | Admitting: Emergency Medicine

## 2019-09-10 NOTE — Progress Notes (Addendum)
Preop instructions for:   Danielle Dixon  Date of Birth -Aug 02, 1941                         Date of Procedure: Wednesday ,January 6th, 2020       Surgeon: Dr. Link Snuffer  Time to arrive at [facility]: 8:00AM  Report to: Radiology Department at [facility]   Procedure: Nephrolithotomy Percutaneous-right   Do not eat or drink past midnight the night before your procedure.(To include any tube feedings-must be discontinued)    Take these morning medications only with sips of water.(or give through gastrostomy or feeding tube). Atenolol (Tenormin), Paroxetine (Paxil-CR), Zafirlukast (Accolate),  Use Alprazolam (Xanax) if needed, use Albuterol inhaler if needed and bring inha le rwith you to the hospital.  Note: No Insulin or Diabetic meds should be given or taken the morning of the procedure!   Facility contact:  Diane Materials engineer                Phone: [phone]      [phone]              Health Care POA: sister- Danielle Dixon Home [id number]  Transportation contact phone#:Facility to provide transportation -[facility]  [phone]  Please send day of procedure:current med list and meds last taken that day, confirm nothing by mouth status from what time, patient demographic info( to include DNR status, problem list, allergies)   RN contact name/phone#:  Marissa Calamity [phone]    and Fax [phone]  [employer] card and picture ID Leave all jewelry and other valuables at place where living( no metal or rings to be worn) No contact lens Women-no make-up, no lotions,perfumes,powders Men-no colognes,lotions  Any questions day of procedure,call  SHORT STAY-[phone]   Sent from :[facility]                   Phone:[phone]                   Fax:[phone]  Sent by : Vita Erm, BSN ,RN

## 2019-09-10 NOTE — Progress Notes (Signed)
Surgery instructions faxed to [facility] place [phone]; requested facility call PST to confirm instructions received

## 2019-09-10 NOTE — Progress Notes (Signed)
PCP - Dr. Jonathon Jordan Cardiologist - n/a  Chest x-ray - 07/12/2019 EPIC EKG - 07/09/2019  1 view Epic Stress Test - n/a ECHO - n/a Cardiac Cath n/a-   Sleep Study - n/a CPAP -n/a   Fasting Blood Sugar -n/a  Checks Blood Sugar _____ times a day  Blood Thinner Instructions:n/a Aspirin Instructions:n/a Last Dose:n/a  Anesthesia review:  Patient has a history of Rheumatoid Arthritis, Mild Dementia , kidney stones and COPD.  Patient denies shortness of breath, fever, cough and chest pain at PAT appointment   Patient verbalized understanding of instructions that were given to them at the PAT appointment. Patient was also instructed that they will need to review over the PAT instructions again at home before surgery.

## 2019-09-10 NOTE — Progress Notes (Addendum)
[facility] place , spoke with the Med tech [place] Isaias Sakai) and requested patient MAR, H&P , and recent lab work to be faxed to PST dept. Fax number provided. Per SO:[id number], she cannot confirm transportation arrangements until she speak with her supervisor; she requests this RN call back after 2:00pm to confirm.    addendeum  12/29 [address] to confirm transportation with Makena as previously agreed. No answer , facility voicemail box is full; unable to LVMM.   Addendum 12/29 1425 Called facility back and now spoke with Diane who states she is the Information systems manager. She states that due to covid outbreak , facility is  unable to arrange transportation for day of surgery and would like toknow what other otpions for trasnportation there is. RN informed Diane that Dr Gloriann Loan office will be made aware .  Called [facility] and spoke with Pam (Dr Gloriann Loan surgery coordinator) to make aware of the above. Pam is aware that patient was recently cancelled due to the same issues with transportation. RN suggested Pam offer [facility] as a Building surveyor for the facility and advise the facility to go ahead and contact [facility] asap to arrange transport. Pam agreeable and plans to contact Diane directly. RN to follow up and confirm arrangements prior to day of surgery.   Addendum 12/29  1500  received call from [facility] stating that she contacted Diane at the facility and Shauna Hugh is stating that she does not know what [facility] is. Pam states he then called patient's sister and  HCPOA [name] and now [name] is wanting [facility] info to arrange the transport herself and PAm wanting to know if [facility] will allow her to do do. This RN unable to confirm that.

## 2019-09-12 NOTE — Progress Notes (Addendum)
lvmm for Danielle Dixon at [facility] requesting call back so she may confirm what arrangements she has made with the [facility] facility for patient transportation day of surgery   12/31 1141 Received call from [facility] at [facility]. Danielle Dixon states she has not yet heard back from [employer] nor the patient sister [name] concerning transportation arrangements. RN asks Danielle Dixon to contact PST once arrangements are made or with any related info .

## 2019-09-17 ENCOUNTER — Other Ambulatory Visit: Payer: Self-pay | Admitting: Radiology

## 2019-09-18 ENCOUNTER — Ambulatory Visit (HOSPITAL_COMMUNITY): Payer: Medicare Other | Admitting: Anesthesiology

## 2019-09-18 ENCOUNTER — Observation Stay (HOSPITAL_COMMUNITY)
Admission: RE | Admit: 2019-09-18 | Discharge: 2019-09-20 | Disposition: A | Discharge status: Other Acute Inpatient Hospital | Payer: Medicare Other | Attending: Urology | Admitting: Urology

## 2019-09-18 ENCOUNTER — Ambulatory Visit (HOSPITAL_COMMUNITY)
Admission: RE | Admit: 2019-09-18 | Discharge: 2019-09-18 | Disposition: A | Discharge status: Home / Self Care | Payer: Medicare Other | Source: Ambulatory Visit | Attending: Urology | Admitting: Urology

## 2019-09-18 ENCOUNTER — Ambulatory Visit (HOSPITAL_COMMUNITY): Payer: Medicare Other

## 2019-09-18 ENCOUNTER — Encounter (HOSPITAL_COMMUNITY)
Admission: RE | Disposition: A | Discharge status: Nursing Home (Includes ICF) | Payer: Self-pay | Source: Home / Self Care | Attending: Urology

## 2019-09-18 ENCOUNTER — Other Ambulatory Visit: Payer: Self-pay

## 2019-09-18 ENCOUNTER — Encounter (HOSPITAL_COMMUNITY): Payer: Self-pay | Admitting: Urology

## 2019-09-18 DIAGNOSIS — I10 Essential (primary) hypertension: Secondary | ICD-10-CM | POA: Diagnosis not present

## 2019-09-18 DIAGNOSIS — Z7982 Long term (current) use of aspirin: Secondary | ICD-10-CM | POA: Diagnosis not present

## 2019-09-18 DIAGNOSIS — M069 Rheumatoid arthritis, unspecified: Secondary | ICD-10-CM | POA: Diagnosis not present

## 2019-09-18 DIAGNOSIS — F0280 Dementia in other diseases classified elsewhere without behavioral disturbance: Secondary | ICD-10-CM | POA: Insufficient documentation

## 2019-09-18 DIAGNOSIS — N200 Calculus of kidney: Secondary | ICD-10-CM

## 2019-09-18 DIAGNOSIS — Z20822 Contact with and (suspected) exposure to covid-19: Secondary | ICD-10-CM | POA: Insufficient documentation

## 2019-09-18 DIAGNOSIS — J449 Chronic obstructive pulmonary disease, unspecified: Secondary | ICD-10-CM | POA: Diagnosis not present

## 2019-09-18 DIAGNOSIS — G309 Alzheimer's disease, unspecified: Secondary | ICD-10-CM | POA: Insufficient documentation

## 2019-09-18 DIAGNOSIS — E785 Hyperlipidemia, unspecified: Secondary | ICD-10-CM | POA: Insufficient documentation

## 2019-09-18 DIAGNOSIS — F329 Major depressive disorder, single episode, unspecified: Secondary | ICD-10-CM | POA: Diagnosis not present

## 2019-09-18 DIAGNOSIS — F419 Anxiety disorder, unspecified: Secondary | ICD-10-CM | POA: Diagnosis not present

## 2019-09-18 DIAGNOSIS — N201 Calculus of ureter: Secondary | ICD-10-CM | POA: Diagnosis not present

## 2019-09-18 DIAGNOSIS — Z87442 Personal history of urinary calculi: Secondary | ICD-10-CM | POA: Insufficient documentation

## 2019-09-18 DIAGNOSIS — Z79899 Other long term (current) drug therapy: Secondary | ICD-10-CM | POA: Diagnosis not present

## 2019-09-18 HISTORY — DX: Essential (primary) hypertension: I10

## 2019-09-18 HISTORY — DX: Anemia, unspecified: D64.9

## 2019-09-18 HISTORY — PX: NEPHROLITHOTOMY: SHX5134

## 2019-09-18 HISTORY — DX: Deficiency of other specified B group vitamins: E53.8

## 2019-09-18 HISTORY — PX: IR URETERAL STENT RIGHT NEW ACCESS W/O SEP NEPHROSTOMY CATH: IMG6076

## 2019-09-18 HISTORY — DX: Rheumatoid arthritis, unspecified: M06.9

## 2019-09-18 LAB — RESPIRATORY PANEL BY RT PCR (FLU A&B, COVID)
Influenza A by PCR: NEGATIVE
Influenza B by PCR: NEGATIVE
SARS Coronavirus 2 by RT PCR: NEGATIVE

## 2019-09-18 LAB — BASIC METABOLIC PANEL
Anion gap: 10 (ref 5–15)
Anion gap: 11 (ref 5–15)
BUN: 14 mg/dL (ref 8–23)
BUN: 12 mg/dL (ref 8–23)
CO2: 26 mmol/L (ref 22–32)
CO2: 24 mmol/L (ref 22–32)
Calcium: 8.8 mg/dL — ABNORMAL LOW (ref 8.9–10.3)
Calcium: 8.8 mg/dL — ABNORMAL LOW (ref 8.9–10.3)
Chloride: 106 mmol/L (ref 98–111)
Chloride: 109 mmol/L (ref 98–111)
Creatinine, Ser: 0.97 mg/dL (ref 0.44–1.00)
Creatinine, Ser: 0.99 mg/dL (ref 0.44–1.00)
GFR calc Af Amer: >60 mL/min (ref >60)
GFR calc Af Amer: >60 mL/min (ref >60)
GFR calc non Af Amer: 56 mL/min — ABNORMAL LOW (ref >60)
GFR calc non Af Amer: 54 mL/min — ABNORMAL LOW (ref >60)
Glucose, Bld: 100 mg/dL — ABNORMAL HIGH (ref 70–99)
Glucose, Bld: 116 mg/dL — ABNORMAL HIGH (ref 70–99)
Potassium: 3.9 mmol/L (ref 3.5–5.1)
Potassium: 3.9 mmol/L (ref 3.5–5.1)
Sodium: 142 mmol/L (ref 135–145)
Sodium: 144 mmol/L (ref 135–145)

## 2019-09-18 LAB — MRSA PCR SCREENING: MRSA by PCR: NEGATIVE

## 2019-09-18 LAB — CBC WITH DIFFERENTIAL/PLATELET
Abs Immature Granulocytes: 0.04 10*3/uL (ref 0.00–0.07)
Basophils Absolute: 0 10*3/uL (ref 0.0–0.1)
Basophils Relative: 0 %
Eosinophils Absolute: 0.2 10*3/uL (ref 0.0–0.5)
Eosinophils Relative: 2 %
HCT: 36.8 % (ref 36.0–46.0)
Hemoglobin: 11.3 g/dL — ABNORMAL LOW (ref 12.0–15.0)
Immature Granulocytes: 0 %
Lymphocytes Relative: 27 %
Lymphs Abs: 3.6 10*3/uL (ref 0.7–4.0)
MCH: 30.3 pg (ref 26.0–34.0)
MCHC: 30.7 g/dL (ref 30.0–36.0)
MCV: 98.7 fL (ref 80.0–100.0)
Monocytes Absolute: 0.6 10*3/uL (ref 0.1–1.0)
Monocytes Relative: 5 %
Neutro Abs: 8.8 10*3/uL — ABNORMAL HIGH (ref 1.7–7.7)
Neutrophils Relative %: 66 %
Platelets: 402 10*3/uL — ABNORMAL HIGH (ref 150–400)
RBC: 3.73 MIL/uL — ABNORMAL LOW (ref 3.87–5.11)
RDW: 16.4 % — ABNORMAL HIGH (ref 11.5–15.5)
WBC: 13.2 10*3/uL — ABNORMAL HIGH (ref 4.0–10.5)
nRBC: 0 % (ref 0.0–0.2)

## 2019-09-18 LAB — PROTIME-INR
INR: 1.1 (ref 0.8–1.2)
Prothrombin Time: 14 seconds (ref 11.4–15.2)

## 2019-09-18 LAB — TYPE AND SCREEN
ABO/RH(D): B POS
Antibody Screen: NEGATIVE

## 2019-09-18 LAB — HEMOGLOBIN AND HEMATOCRIT, BLOOD
HCT: 38.8 % (ref 36.0–46.0)
Hemoglobin: 11.8 g/dL — ABNORMAL LOW (ref 12.0–15.0)

## 2019-09-18 LAB — ABO/RH: ABO/RH(D): B POS

## 2019-09-18 SURGERY — NEPHROLITHOTOMY PERCUTANEOUS
Anesthesia: General | Laterality: Right

## 2019-09-18 MED ORDER — POLYVINYL ALCOHOL 1.4 % OP SOLN
1.0000 [drp] | OPHTHALMIC | Status: DC | PRN
  Filled 2019-09-18: qty 15

## 2019-09-18 MED ORDER — BACITRACIN ZINC 500 UNIT/GM EX OINT
TOPICAL_OINTMENT | CUTANEOUS | Status: AC
  Filled 2019-09-18: qty 28.35

## 2019-09-18 MED ORDER — MIDAZOLAM HCL 2 MG/2ML IJ SOLN
INTRAMUSCULAR | Status: AC
  Filled 2019-09-18: qty 4

## 2019-09-18 MED ORDER — CEFAZOLIN SODIUM-DEXTROSE 2-4 GM/100ML-% IV SOLN
INTRAVENOUS | Status: AC
  Administered 2019-09-18: 10:00:00 2000 mg
  Filled 2019-09-18: qty 100

## 2019-09-18 MED ORDER — FENTANYL CITRATE (PF) 100 MCG/2ML IJ SOLN
INTRAMUSCULAR | Status: AC | PRN
  Administered 2019-09-18 (×2): 25 ug via INTRAVENOUS

## 2019-09-18 MED ORDER — FENTANYL CITRATE (PF) 100 MCG/2ML IJ SOLN
25.0000 ug | INTRAMUSCULAR | Status: DC | PRN
  Administered 2019-09-18: 50 ug via INTRAVENOUS

## 2019-09-18 MED ORDER — GLYCERIN-HYPROMELLOSE-PEG 400 0.2-0.2-1 % OP SOLN: 1.0000 [drp] | Freq: Four times a day (QID) | OPHTHALMIC | Status: DC | PRN

## 2019-09-18 MED ORDER — ENSURE ENLIVE PO LIQD
237.0000 mL | Freq: Three times a day (TID) | ORAL | Status: DC
  Administered 2019-09-18 – 2019-09-19 (×5): 237 mL via ORAL

## 2019-09-18 MED ORDER — HYDROCODONE-ACETAMINOPHEN 5-325 MG PO TABS: 1.0000 | ORAL_TABLET | ORAL | 0 refills | Status: DC | PRN

## 2019-09-18 MED ORDER — FENTANYL CITRATE (PF) 250 MCG/5ML IJ SOLN
INTRAMUSCULAR | Status: DC | PRN
  Administered 2019-09-18 (×3): 50 ug via INTRAVENOUS

## 2019-09-18 MED ORDER — DEXAMETHASONE SODIUM PHOSPHATE 10 MG/ML IJ SOLN
INTRAMUSCULAR | Status: AC
  Filled 2019-09-18: qty 1

## 2019-09-18 MED ORDER — DIPHENHYDRAMINE HCL 50 MG/ML IJ SOLN: 12.5000 mg | Freq: Four times a day (QID) | INTRAMUSCULAR | Status: DC | PRN

## 2019-09-18 MED ORDER — ONDANSETRON HCL 4 MG/2ML IJ SOLN
INTRAMUSCULAR | Status: AC
  Filled 2019-09-18: qty 2

## 2019-09-18 MED ORDER — LACTATED RINGERS IV SOLN: INTRAVENOUS | Status: DC

## 2019-09-18 MED ORDER — IOHEXOL 300 MG/ML  SOLN
INTRAMUSCULAR | Status: DC | PRN
  Administered 2019-09-18: 10 mL
  Administered 2019-09-18: 13:00:00 5 mL

## 2019-09-18 MED ORDER — SENNA 8.6 MG PO TABS
1.0000 | ORAL_TABLET | Freq: Two times a day (BID) | ORAL | Status: DC
  Administered 2019-09-18 – 2019-09-19 (×3): 8.6 mg via ORAL
  Filled 2019-09-18 (×3): qty 1

## 2019-09-18 MED ORDER — IOHEXOL 300 MG/ML  SOLN
50.0000 mL | Freq: Once | INTRAMUSCULAR | Status: AC | PRN
  Administered 2019-09-18: 11:00:00 8 mL

## 2019-09-18 MED ORDER — DEXAMETHASONE SODIUM PHOSPHATE 10 MG/ML IJ SOLN
INTRAMUSCULAR | Status: DC | PRN
  Administered 2019-09-18: 6 mg via INTRAVENOUS

## 2019-09-18 MED ORDER — OXYCODONE HCL 5 MG PO TABS
5.0000 mg | ORAL_TABLET | ORAL | Status: DC | PRN
  Administered 2019-09-18: 5 mg via ORAL
  Filled 2019-09-18: qty 1

## 2019-09-18 MED ORDER — EPHEDRINE SULFATE-NACL 50-0.9 MG/10ML-% IV SOSY
PREFILLED_SYRINGE | INTRAVENOUS | Status: DC | PRN
  Administered 2019-09-18 (×2): 5 mg via INTRAVENOUS

## 2019-09-18 MED ORDER — FENTANYL CITRATE (PF) 250 MCG/5ML IJ SOLN
INTRAMUSCULAR | Status: AC
  Filled 2019-09-18: qty 5

## 2019-09-18 MED ORDER — ATENOLOL 25 MG PO TABS
25.0000 mg | ORAL_TABLET | Freq: Every day | ORAL | Status: DC
  Administered 2019-09-19: 25 mg via ORAL
  Filled 2019-09-18: qty 1

## 2019-09-18 MED ORDER — FOLIC ACID 1 MG PO TABS
1.0000 mg | ORAL_TABLET | Freq: Every day | ORAL | Status: DC
  Administered 2019-09-18 – 2019-09-19 (×2): 1 mg via ORAL
  Filled 2019-09-18 (×2): qty 1

## 2019-09-18 MED ORDER — 0.9 % SODIUM CHLORIDE (POUR BTL) OPTIME
TOPICAL | Status: DC | PRN
  Administered 2019-09-18: 1000 mL

## 2019-09-18 MED ORDER — LIDOCAINE 2% (20 MG/ML) 5 ML SYRINGE
INTRAMUSCULAR | Status: DC | PRN
  Administered 2019-09-18: 60 mg via INTRAVENOUS

## 2019-09-18 MED ORDER — ONDANSETRON HCL 4 MG/2ML IJ SOLN: 4.0000 mg | Freq: Once | INTRAMUSCULAR | Status: DC | PRN

## 2019-09-18 MED ORDER — GUAIFENESIN 100 MG/5ML PO SOLN: 200.0000 mg | Freq: Four times a day (QID) | ORAL | Status: DC | PRN

## 2019-09-18 MED ORDER — SODIUM CHLORIDE 0.9 % IR SOLN
Status: DC | PRN
  Administered 2019-09-18: 18000 mL

## 2019-09-18 MED ORDER — SUCCINYLCHOLINE CHLORIDE 200 MG/10ML IV SOSY
PREFILLED_SYRINGE | INTRAVENOUS | Status: AC
  Filled 2019-09-18: qty 10

## 2019-09-18 MED ORDER — DONEPEZIL HCL 10 MG PO TABS
10.0000 mg | ORAL_TABLET | Freq: Every day | ORAL | Status: DC
  Administered 2019-09-18 – 2019-09-19 (×2): 10 mg via ORAL
  Filled 2019-09-18 (×2): qty 1

## 2019-09-18 MED ORDER — DIPHENHYDRAMINE HCL 12.5 MG/5ML PO ELIX: 12.5000 mg | ORAL_SOLUTION | Freq: Four times a day (QID) | ORAL | Status: DC | PRN

## 2019-09-18 MED ORDER — SODIUM CHLORIDE 0.9 % IV SOLN: INTRAVENOUS | Status: DC

## 2019-09-18 MED ORDER — ACETAMINOPHEN 325 MG PO TABS: 650.0000 mg | ORAL_TABLET | ORAL | Status: DC | PRN

## 2019-09-18 MED ORDER — PROPOFOL 10 MG/ML IV BOLUS
INTRAVENOUS | Status: DC | PRN
  Administered 2019-09-18: 100 mg via INTRAVENOUS

## 2019-09-18 MED ORDER — FENTANYL CITRATE (PF) 100 MCG/2ML IJ SOLN
INTRAMUSCULAR | Status: AC
  Filled 2019-09-18: qty 2

## 2019-09-18 MED ORDER — ROCURONIUM BROMIDE 10 MG/ML (PF) SYRINGE
PREFILLED_SYRINGE | INTRAVENOUS | Status: AC
  Filled 2019-09-18: qty 10

## 2019-09-18 MED ORDER — SUGAMMADEX SODIUM 200 MG/2ML IV SOLN
INTRAVENOUS | Status: DC | PRN
  Administered 2019-09-18: 120 mg via INTRAVENOUS

## 2019-09-18 MED ORDER — ZOLPIDEM TARTRATE 5 MG PO TABS: 5.0000 mg | ORAL_TABLET | Freq: Every evening | ORAL | Status: DC | PRN

## 2019-09-18 MED ORDER — LIDOCAINE HCL 1 % IJ SOLN
INTRAMUSCULAR | Status: AC
  Filled 2019-09-18: qty 20

## 2019-09-18 MED ORDER — ROCURONIUM BROMIDE 10 MG/ML (PF) SYRINGE
PREFILLED_SYRINGE | INTRAVENOUS | Status: DC | PRN
  Administered 2019-09-18: 20 mg via INTRAVENOUS
  Administered 2019-09-18: 40 mg via INTRAVENOUS

## 2019-09-18 MED ORDER — ALPRAZOLAM 0.25 MG PO TABS
0.2500 mg | ORAL_TABLET | Freq: Every day | ORAL | Status: DC | PRN
  Administered 2019-09-18: 0.25 mg via ORAL
  Filled 2019-09-18: qty 1

## 2019-09-18 MED ORDER — ONDANSETRON HCL 4 MG/2ML IJ SOLN: 4.0000 mg | INTRAMUSCULAR | Status: DC | PRN

## 2019-09-18 MED ORDER — PAROXETINE HCL ER 25 MG PO TB24
25.0000 mg | ORAL_TABLET | Freq: Every day | ORAL | Status: DC
  Administered 2019-09-19: 25 mg via ORAL
  Filled 2019-09-18 (×2): qty 1

## 2019-09-18 MED ORDER — LIDOCAINE 2% (20 MG/ML) 5 ML SYRINGE
INTRAMUSCULAR | Status: AC
  Filled 2019-09-18: qty 5

## 2019-09-18 MED ORDER — ALBUTEROL SULFATE (2.5 MG/3ML) 0.083% IN NEBU
2.5000 mg | INHALATION_SOLUTION | Freq: Four times a day (QID) | RESPIRATORY_TRACT | Status: DC
  Administered 2019-09-18 – 2019-09-19 (×5): 2.5 mg via RESPIRATORY_TRACT
  Filled 2019-09-18 (×6): qty 3

## 2019-09-18 MED ORDER — CEFAZOLIN SODIUM-DEXTROSE 2-4 GM/100ML-% IV SOLN: 2.0000 g | INTRAVENOUS | Status: DC

## 2019-09-18 MED ORDER — EPHEDRINE 5 MG/ML INJ
INTRAVENOUS | Status: AC
  Filled 2019-09-18: qty 10

## 2019-09-18 MED ORDER — MORPHINE SULFATE (PF) 2 MG/ML IV SOLN: 2.0000 mg | INTRAVENOUS | Status: DC | PRN

## 2019-09-18 MED ORDER — ONDANSETRON HCL 4 MG/2ML IJ SOLN
INTRAMUSCULAR | Status: DC | PRN
  Administered 2019-09-18: 4 mg via INTRAVENOUS

## 2019-09-18 MED ORDER — SIMVASTATIN 20 MG PO TABS
10.0000 mg | ORAL_TABLET | Freq: Every day | ORAL | Status: DC
  Administered 2019-09-18 – 2019-09-19 (×2): 10 mg via ORAL
  Filled 2019-09-18 (×2): qty 1

## 2019-09-18 MED ORDER — MONTELUKAST SODIUM 10 MG PO TABS
10.0000 mg | ORAL_TABLET | Freq: Every day | ORAL | Status: DC
  Administered 2019-09-18 – 2019-09-19 (×2): 10 mg via ORAL
  Filled 2019-09-18 (×2): qty 1

## 2019-09-18 MED ORDER — THROMBIN (RECOMBINANT) 5000 UNITS EX SOLR
CUTANEOUS | Status: AC
  Filled 2019-09-18: qty 5000

## 2019-09-18 MED ORDER — CEFAZOLIN SODIUM-DEXTROSE 2-4 GM/100ML-% IV SOLN
2.0000 g | Freq: Three times a day (TID) | INTRAVENOUS | Status: AC
  Administered 2019-09-18 (×2): 2 g via INTRAVENOUS
  Filled 2019-09-18 (×2): qty 100

## 2019-09-18 MED ORDER — DOCUSATE SODIUM 100 MG PO CAPS
100.0000 mg | ORAL_CAPSULE | Freq: Two times a day (BID) | ORAL | Status: DC
  Administered 2019-09-18 – 2019-09-19 (×3): 100 mg via ORAL
  Filled 2019-09-18 (×3): qty 1

## 2019-09-18 MED ORDER — PROPOFOL 10 MG/ML IV BOLUS
INTRAVENOUS | Status: AC
  Filled 2019-09-18: qty 20

## 2019-09-18 MED ORDER — MIDAZOLAM HCL 2 MG/2ML IJ SOLN
INTRAMUSCULAR | Status: AC | PRN
  Administered 2019-09-18 (×2): 0.5 mg via INTRAVENOUS

## 2019-09-18 SURGICAL SUPPLY — 47 items
APPLICATOR ARISTA FLEXITIP XL (MISCELLANEOUS) ×2 IMPLANT
BAG URINE DRAIN 2000ML AR STRL (UROLOGICAL SUPPLIES) IMPLANT
BASKET ZERO TIP NITINOL 2.4FR (BASKET) IMPLANT
BENZOIN TINCTURE PRP APPL 2/3 (GAUZE/BANDAGES/DRESSINGS) ×3 IMPLANT
BLADE SURG 15 STRL LF DISP TIS (BLADE) ×1 IMPLANT
BLADE SURG 15 STRL SS (BLADE) ×2
CATH FOLEY 2W COUNCIL 20FR 5CC (CATHETERS) IMPLANT
CATH ROBINSON RED A/P 20FR (CATHETERS) IMPLANT
CATH URET DUAL LUMEN 6-10FR 50 (CATHETERS) ×3 IMPLANT
CATH X-FORCE N30 NEPHROSTOMY (TUBING) ×3 IMPLANT
CHLORAPREP W/TINT 26 (MISCELLANEOUS) ×3 IMPLANT
COVER SURGICAL LIGHT HANDLE (MISCELLANEOUS) IMPLANT
COVER WAND RF STERILE (DRAPES) IMPLANT
DRAPE C-ARM 42X120 X-RAY (DRAPES) ×3 IMPLANT
DRAPE LINGEMAN PERC (DRAPES) ×3 IMPLANT
DRAPE SURG IRRIG POUCH 19X23 (DRAPES) ×3 IMPLANT
DRSG PAD ABDOMINAL 8X10 ST (GAUZE/BANDAGES/DRESSINGS) ×6 IMPLANT
DRSG TEGADERM 8X12 (GAUZE/BANDAGES/DRESSINGS) IMPLANT
FIBER LASER FLEXIVA 365 (UROLOGICAL SUPPLIES) IMPLANT
FIBER LASER TRAC TIP (UROLOGICAL SUPPLIES) IMPLANT
GAUZE SPONGE 4X4 12PLY STRL (GAUZE/BANDAGES/DRESSINGS) ×3 IMPLANT
GLOVE BIO SURGEON STRL SZ7.5 (GLOVE) ×3 IMPLANT
GOWN STRL REUS W/TWL XL LVL3 (GOWN DISPOSABLE) ×3 IMPLANT
GUIDEWIRE AMPLAZ .035X145 (WIRE) ×6 IMPLANT
HEMOSTAT ARISTA ABSORB 3G PWDR (HEMOSTASIS) ×2 IMPLANT
KIT BASIN OR (CUSTOM PROCEDURE TRAY) ×3 IMPLANT
KIT PROBE TRILOGY 3.9X350 (MISCELLANEOUS) ×2 IMPLANT
KIT TURNOVER KIT A (KITS) IMPLANT
MANIFOLD NEPTUNE II (INSTRUMENTS) ×3 IMPLANT
NS IRRIG 1000ML POUR BTL (IV SOLUTION) ×3 IMPLANT
PACK CYSTO (CUSTOM PROCEDURE TRAY) ×3 IMPLANT
SPOGE SURGIFLO 8M (HEMOSTASIS)
SPONGE LAP 4X18 RFD (DISPOSABLE) ×3 IMPLANT
SPONGE SURGIFLO 8M (HEMOSTASIS) IMPLANT
STENT ENDOURETEROTOMY 7-14 26C (STENTS) IMPLANT
SUT CHROMIC 3 0 PS 2 (SUTURE) ×2 IMPLANT
SUT SILK 2 0 30  PSL (SUTURE)
SUT SILK 2 0 30 PSL (SUTURE) IMPLANT
SYR 10ML LL (SYRINGE) ×3 IMPLANT
SYR 20ML LL LF (SYRINGE) ×6 IMPLANT
TOWEL OR NON WOVEN STRL DISP B (DISPOSABLE) ×3 IMPLANT
TRAY FOLEY MTR SLVR 16FR STAT (SET/KITS/TRAYS/PACK) ×3 IMPLANT
TUBING CONNECTING 10 (TUBING) ×4 IMPLANT
TUBING CONNECTING 10' (TUBING) ×2
TUBING STONE CATCHER TRILOGY (MISCELLANEOUS) IMPLANT
TUBING UROLOGY SET (TUBING) ×3 IMPLANT
WATER STERILE IRR 1000ML POUR (IV SOLUTION) ×3 IMPLANT

## 2019-09-18 NOTE — Anesthesia Procedure Notes (Signed)
Procedure Name: Intubation Date/Time: 09/18/2019 11:45 AM Performed by: Sharlette Dense, CRNA Patient Re-evaluated:Patient Re-evaluated prior to induction Oxygen Delivery Method: Circle system utilized Preoxygenation: Pre-oxygenation with 100% oxygen Induction Type: IV induction Ventilation: Mask ventilation without difficulty and Oral airway inserted - appropriate to patient size Laryngoscope Size: Sabra Heck and 2 Grade View: Grade I Tube type: Oral Tube size: 7.0 mm Number of attempts: 1 Airway Equipment and Method: Stylet Placement Confirmation: ETT inserted through vocal cords under direct vision,  positive ETCO2 and breath sounds checked- equal and bilateral Secured at: 21 cm Tube secured with: Tape Dental Injury: Teeth and Oropharynx as per pre-operative assessment

## 2019-09-18 NOTE — Consult Note (Signed)
Chief Complaint: Patient was seen in consultation today for right percutaneous nephrostomy/nephroureteral catheter placement  Referring Physician(s): Dun[place]  Supervising Physician: Corrie Mckusick  Patient Status: [facility] - Out-pt TBA  History of Present Illness: Danielle Dixon is a 79 y.o. 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  She has previously undergone cystoscopy with left ureteroscopy, laser lithotripsy and ureteral exchange exchange on 07/29/2019.  Past Medical History:  Diagnosis Date  . Alzheimer's dementia ([place])    per records from [facility]  . Anxiety   . Arthritis    ra  . Constipation   . COPD (chronic obstructive pulmonary disease) ([place])   . Dementia ([place])   . Dementia ([place])   . Depression   . Dyspnea 06/25/2013  . Leukocytosis   . Leukocytosis, unspecified 06/25/2013  . Leukocytosis, unspecified 06/25/2013  . Psoriasis   . Unspecified vitamin D deficiency 06/25/2013    Past Surgical History:  Procedure Laterality Date  . APPENDECTOMY    . CYSTOSCOPY W/ URETERAL STENT PLACEMENT Bilateral 07/10/2019   Procedure: CYSTOSCOPY WITH RETROGRADE PYELOGRAM AND BILATERAL URETERAL STENT PLACEMENT;  Surgeon: Lucas Mallow, MD;  Location: [facility] ORS;  Service: Urology;  Laterality: Bilateral;  . CYSTOSCOPY/URETEROSCOPY/HOLMIUM LASER/STENT PLACEMENT Bilateral 07/29/2019   Procedure: CYSTOSCOPY LEFT RETROGRADE PYELOGRAM LEFT URETEROSCOPY HOLMIUM LASER LEFT STENT PLACEMENT POSSIBLE RIGHT URETEROSCOPY LASER LITHOTRIPSY AND STENT;  Surgeon: Lucas Mallow, MD;  Location: [facility] ORS;  Service: Urology;  Laterality: Bilateral;  . knee replacment Left   . TOE SURGERY      Allergies: Patient has no  known allergies.  Medications: Prior to Admission medications   Medication Sig Start Date End Date Taking? Authorizing Provider  albuterol (VENTOLIN HFA) 108 (90 Base) MCG/ACT inhaler Inhale 2 puffs into the lungs 4 (four) times daily. (0900, 1200, 1600, & 2000)   Yes [provider]  ALPRAZolam (XANAX) 0.25 MG tablet Take 0.25 mg by mouth daily as needed for anxiety.   Yes [provider]  aspirin 81 MG chewable tablet Chew 81 mg by mouth daily. (0900)   Yes [provider]  atenolol (TENORMIN) 25 MG tablet Take 25 mg by mouth daily. (0900)   Yes [provider]  cholecalciferol (VITAMIN D) 1000 UNITS tablet Take 1 tablet (1,000 Units total) by mouth daily. Patient taking differently: Take 1,000 Units by mouth daily. (0900) 06/25/13  Yes Gorsuch, Ni, MD  clobetasol cream (TEMOVATE) [id number] % Apply 1 application topically 2 (two) times daily as needed (rash).   Yes [provider]  donepezil (ARICEPT) 10 MG tablet Take 10 mg by mouth at bedtime. (2100)   Yes [provider]  Ensure (ENSURE) Take 237 mLs by mouth 3 (three) times daily. (0800, 1200, & 1700)   Yes [provider]  folic acid (FOLVITE) 1 MG tablet Take 1 mg by mouth daily. (0900)   Yes [provider]  Glycerin-Hypromellose-PEG 400 (DRY EYE RELIEF DROPS) 0.2-0.2-1 % SOLN Place 1 drop into both eyes 4 (four) times daily as needed (dry eyes).   Yes [provider]  guaifenesin (ROBITUSSIN) 100 MG/5ML syrup Take 200 mg by mouth 4 (four) times daily as needed for cough.   Yes [provider]  methotrexate (RHEUMATREX) 2.5 MG tablet Take 17.5 mg by mouth every Wednesday. (0900)  Yes [provider]  PARoxetine (PAXIL-CR) 25 MG 24 hr tablet Take 25 mg by mouth daily. (0600)   Yes [provider]  polyethylene glycol (MIRALAX / GLYCOLAX) 17 g packet Take 17 g by mouth daily as needed for moderate constipation.   Yes [provider]  simvastatin (ZOCOR) 10 MG tablet Take 10 mg by mouth at bedtime. (2100)   Yes [provider]  triamcinolone cream (KENALOG) 0.1 % Apply 1 application topically 2 (two) times daily as needed (psoriasis/rash).    Yes [provider]  vitamin B-12 (CYANOCOBALAMIN) 500 MCG tablet Take 500 mcg by mouth daily. (0900)   Yes [provider]  zafirlukast (ACCOLATE) 20 MG tablet Take 20 mg by mouth 2 (two) times daily. (0900 & 2100)   Yes [provider]     Family History  Problem Relation Age of Onset  . Cancer Mother        breast cancer  . Cancer Father        lung    Social History   Socioeconomic History  . Marital status: Divorced    Spouse name: Not on file  . Number of children: Not on file  . Years of education: Not on file  . Highest education level: Not on file  Occupational History  . Not on file  Tobacco Use  . Smoking status: Never Smoker  . Smokeless tobacco: Never Used  Substance and Sexual Activity  . Alcohol use: No  . Drug use: No  . Sexual activity: Not on file  Other Topics Concern  . Not on file  Social History Narrative  . Not on file   Social Determinants of Health   Financial Resource Strain:   . Difficulty of Paying Living Expenses: Not on file  Food Insecurity:   . Worried About Charity fundraiser in the Last Year: Not on file  . Ran Out of Food in the Last Year: Not on file  Transportation Needs:   . Lack of Transportation (Medical): Not on file  . Lack of Transportation (Non-Medical): Not on file  Physical Activity:   . Days of Exercise per Week: Not on file  . Minutes of Exercise per Session: Not on file  Stress:   . Feeling of Stress : Not on file  Social Connections:   . Frequency of Communication with Friends and Family: Not on file  . Frequency of Social Gatherings with Friends and Family: Not on file  . Attends Religious Services: Not on file  . Active Member of Clubs or Organizations: Not on  file  . Attends Archivist Meetings: Not on file  . Marital Status: Not on file      Review of Systems currently denies fever, headache, chest pain, worsening dyspnea, cough, abdominal/back pain, nausea, vomiting or bleeding.  Vital Signs: Blood pressure 143/55, heart rate 50, temp 98.3, respirations 16, O2 sat 98% room air   Physical Exam awake, answer simple questions.  Chest clear to auscultation bilaterally anteriorly.  Heart with bradycardic but regular rhythm.  Abdomen soft, positive bowel sounds, nontender.  No lower extremity edema.  Imaging: No results found.  Labs:  CBC: Recent Labs    07/10/19 0227 07/11/19 0207 07/12/19 0826 07/29/19 0900  WBC 14.2* 7.5 10.4 13.5*  HGB 14.7 10.7* 12.2 12.9  HCT 45.8 33.4* 38.5 42.2  PLT 217 141* 174 468*    COAGS: No results for input(s): INR, APTT in the last 8760 hours.  BMP: Recent Labs    07/14/19 1207 07/16/19 1207 07/17/19 0444 07/29/19 0900  NA 140 143 144 142  K 2.9* 2.9* 3.9 4.0  CL 108 106 110 107  CO2 26 28 27 27   GLUCOSE 117* 125* 108* 101*  BUN 7* 8 10 11   CALCIUM 8.1* 8.5* 8.6* 8.9  CREATININE 0.53 0.61 0.62 0.83  GFRNONAA >60 >60 >60 >60  GFRAA >60 >60 >60 >60    LIVER FUNCTION TESTS: Recent Labs    07/10/19 0227 07/13/19 0543  BILITOT 2.1* 1.1  AST 26 13*  ALT 15 16  ALKPHOS 116 79  PROT 6.3* 5.1*  ALBUMIN 3.2* 2.6*    TUMOR MARKERS: No results for input(s): AFPTM, CEA, CA199, CHROMGRNA in the last 8760 hours.  Assessment and Plan: 79 y.o. 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   All of the patient's questions were answered, patient is agreeable to proceed.  Consent signed and in chart.   LABS PENDING    Thank you for this interesting consult.  I greatly enjoyed meeting Danielle Dixon and look forward to participating in their care.  A copy of this report was sent to the requesting provider on this date.  Electronically Signed: D. Rowe Robert, PA-C 09/18/2019, 8:22 AM   I spent a total of  25 minutes   in face to face in clinical consultation, greater than 50% of which was counseling/coodinating care for right percutaneous nephrostomy/nephroureteral catheter placement

## 2019-09-18 NOTE — Anesthesia Postprocedure Evaluation (Signed)
Anesthesia Post Note  Patient: Danielle Dixon  Procedure(s) Performed: NEPHROLITHOTOMY PERCUTANEOUS (Right )     Patient location during evaluation: PACU Anesthesia Type: General Level of consciousness: awake Pain management: pain level controlled Vital Signs Assessment: post-procedure vital signs reviewed and stable Respiratory status: spontaneous breathing, nonlabored ventilation, respiratory function stable and patient connected to nasal cannula oxygen Cardiovascular status: blood pressure returned to baseline and stable Postop Assessment: no apparent nausea or vomiting Anesthetic complications: no    Last Vitals:  Vitals:   09/18/19 1636 09/18/19 2041  BP:    Pulse:    Resp:    Temp:    SpO2: 96% 98%    Last Pain:  Vitals:   09/18/19 2016  TempSrc:   PainSc: 10-Worst pain ever                 Darthy Manganelli P Lucely Leard

## 2019-09-18 NOTE — Op Note (Signed)
Operative Note  Preoperative diagnosis:  1.  Right renal calculus  Postoperative diagnosis: 1.  Right renal calculus   Procedure(s): 1.  Right percutaneous nephrolithotomy  Surgeon: Link Snuffer, MD  Assistants: None  Anesthesia: General  Complications: None immediate  EBL: 50 cc  Specimens: 1.  Renal calculus  Drains/Catheters: 1.  Foley catheter 2.  Bilateral 6 x 24 double-J ureteral stents  Intraoperative findings: Large greater than 3 cm right renal calculus completely extracted.  Indication: 79 year old female with a large right renal calculus presents for the previously mentioned operation.  She had undergone a bilateral ureteral stent placement urgently for sepsis secondary to a left ureteral stone.  She therefore had bilateral ureteral stents prior to the procedure and she has also already undergone left ureteroscopy with laser lithotripsy and ureteral stent placement.  Description of procedure:  The patient was identified and consent was obtained.  The patient was taken to the operating room and placed in the supine position.  The patient was placed under general anesthesia.  Perioperative antibiotics were administered.  The patient was placed in prone position and all pressure points were padded.  Patient was prepped and draped in a standard sterile fashion and a timeout was performed.  A Super Stiff wire was advanced through the nephroureteral stent down to the bladder under fluoroscopic guidance and the nephroureteral stent was removed.  A dual-lumen ureteral catheter was advanced over the Super Stiff wire into the renal pelvis and an antegrade nephrostogram was performed.  This showed a well opacified kidney and a filling defect corresponding to the stone of interest.  I advanced the dual-lumen ureteral catheter into the proximal ureter under fluoroscopic guidance followed by placement of a second Super Stiff wire down to the bladder under fluoroscopic guidance.  The  dual-lumen catheter was removed.  An incision was made alongside the wires.  The balloon dilator was then advanced over one of the wires and into the renal pelvis fluoroscopic guidance and the tract was dilated to a pressure of 18.  The sheath was advanced over the balloon and into the renal pelvis.  The balloon was withdrawn keeping the sheath in place.  The nephroscope was advanced into the kidney and the stone of interest was encountered.  The stone was then removed with a combination of pneumatic and ultrasound with suction as well as extraction with a grasper.All stone was removed and there was no evidence of any other stones within the kidney.  The nephroscope along with the sheath were withdrawn.    There was minimal bleeding.  There was minimal trauma during the case as well.  Her right-sided ureteral stent was in good place.  I therefore decided not to leave a percutaneous nephrostomy tube and removed both wires.  Fluoroscopy confirmed continued proper placement of the ureteral stent proximally and distally.  "Snow" Powder hemostatic agent was instilled into the nephrostomy tract and the tract was closed with a running 3-0 chromic.  Patient tolerated procedure well was stable postoperative.  Plan: Patient will remain under observation overnight. Stent to be removed in 2 weeks.

## 2019-09-18 NOTE — H&P (Addendum)
H&P  Chief Complaint: Right renal calculus  History of Present Illness: 79 year old female with a history of left obstructing ureteral calculus and right renal pelvis calculus status post bilateral ureteral stent placement followed by left ureteroscopy with laser lithotripsy and ureteral stent placement.  She presents for right-sided PCNL.  Past Medical History:  Diagnosis Date  . Alzheimer's dementia ([place])    per records from [facility]  . Anemia   . Anxiety   . Arthritis    ra  . Constipation   . COPD (chronic obstructive pulmonary disease) ([place])   . Dementia ([place])   . Dementia ([place])   . Depression   . Dyspnea 06/25/2013  . Hypertension   . Leukocytosis   . Leukocytosis, unspecified 06/25/2013  . Leukocytosis, unspecified 06/25/2013  . Psoriasis   . Rheumatoid arthritis ([place])   . Unspecified vitamin D deficiency 06/25/2013  . Vitamin B12 deficiency    Past Surgical History:  Procedure Laterality Date  . APPENDECTOMY    . CYSTOSCOPY W/ URETERAL STENT PLACEMENT Bilateral 07/10/2019   Procedure: CYSTOSCOPY WITH RETROGRADE PYELOGRAM AND BILATERAL URETERAL STENT PLACEMENT;  Surgeon: Lucas Mallow, MD;  Location: [facility];  Service: Urology;  Laterality: Bilateral;  . CYSTOSCOPY/URETEROSCOPY/HOLMIUM LASER/STENT PLACEMENT Bilateral 07/29/2019   Procedure: CYSTOSCOPY LEFT RETROGRADE PYELOGRAM LEFT URETEROSCOPY HOLMIUM LASER LEFT STENT PLACEMENT POSSIBLE RIGHT URETEROSCOPY LASER LITHOTRIPSY AND STENT;  Surgeon: Lucas Mallow, MD;  Location: [facility];  Service: Urology;  Laterality: Bilateral;  . knee replacment Left   . TOE SURGERY      Home Medications:  Medications Prior to Admission  Medication Sig Dispense Refill Last Dose  . albuterol (VENTOLIN HFA) 108 (90 Base) MCG/ACT inhaler Inhale 2 puffs into the lungs 4 (four) times daily. (0900, 1200, 1600, & 2000)   09/17/2019 at Unknown time  . aspirin 81 MG chewable tablet Chew 81 mg by mouth daily. (0900)   09/11/2019  .  atenolol (TENORMIN) 25 MG tablet Take 25 mg by mouth daily. (0900)   09/17/2019 at 0900  . cholecalciferol (VITAMIN D) 1000 UNITS tablet Take 1 tablet (1,000 Units total) by mouth daily. (Patient taking differently: Take 1,000 Units by mouth daily. (0900)) 30 tablet 3 Past Month at Unknown time  . donepezil (ARICEPT) 10 MG tablet Take 10 mg by mouth at bedtime. (2100)   09/17/2019 at Unknown time  . Ensure (ENSURE) Take 237 mLs by mouth 3 (three) times daily. (0800, 1200, & 1700)   09/17/2019 at Unknown time  . folic acid (FOLVITE) 1 MG tablet Take 1 mg by mouth daily. (0900)   Past Month at Unknown time  . methotrexate (RHEUMATREX) 2.5 MG tablet Take 17.5 mg by mouth every Wednesday. (0900)   09/11/2019  . PARoxetine (PAXIL-CR) 25 MG 24 hr tablet Take 25 mg by mouth daily. (0600)   09/17/2019 at Unknown time  . simvastatin (ZOCOR) 10 MG tablet Take 10 mg by mouth at bedtime. (2100)   09/16/2019  . vitamin B-12 (CYANOCOBALAMIN) 500 MCG tablet Take 500 mcg by mouth daily. (0900)   Past Month at Unknown time  . zafirlukast (ACCOLATE) 20 MG tablet Take 20 mg by mouth 2 (two) times daily. (0900 & 2100)   09/17/2019 at Unknown time  . ALPRAZolam (XANAX) 0.25 MG tablet Take 0.25 mg by mouth daily as needed for anxiety.   More than a month at Unknown time  . clobetasol cream (TEMOVATE) [id number] % Apply 1 application topically 2 (two) times daily as needed (  rash).   Unknown at Unknown time  . Glycerin-Hypromellose-PEG 400 (DRY EYE RELIEF DROPS) 0.2-0.2-1 % SOLN Place 1 drop into both eyes 4 (four) times daily as needed (dry eyes).   Unknown at Unknown time  . guaifenesin (ROBITUSSIN) 100 MG/5ML syrup Take 200 mg by mouth 4 (four) times daily as needed for cough.   More than a month at Unknown time  . polyethylene glycol (MIRALAX / GLYCOLAX) 17 g packet Take 17 g by mouth daily as needed for moderate constipation.   More than a month at Unknown time  . triamcinolone cream (KENALOG) 0.1 % Apply 1 application topically 2 (two)  times daily as needed (psoriasis/rash).    Unknown at Unknown time   Allergies: No Known Allergies  Family History  Problem Relation Age of Onset  . Cancer Mother        breast cancer  . Cancer Father        lung   Social History:  reports that she has never smoked. She has never used smokeless tobacco. She reports that she does not drink alcohol or use drugs.  ROS: A complete review of systems was performed.  All systems are negative except for pertinent findings as noted. ROS   Physical Exam:  Vital signs in last 24 hours: Temp:  [97.8 F (36.6 C)-98.3 F (36.8 C)] 97.8 F (36.6 C) (01/06 1100) Pulse Rate:  [39-54] (P) 45 (01/06 1115) Resp:  [13-18] (P) 18 (01/06 1115) BP: (139-172)/(55-67) (P) 145/64 (01/06 1115) SpO2:  [95 %-100 %] (P) 96 % (01/06 1115) Weight:  [60.6 kg] 60.6 kg (01/06 0841) General:  Alert and oriented, No acute distress HEENT: Normocephalic, atraumatic Neck: No JVD or lymphadenopathy Cardiovascular: Regular rate and rhythm Lungs: Regular rate and effort Abdomen: Soft, nontender, nondistended, no abdominal masses Back: No CVA tenderness Extremities: No edema Neurologic: Grossly intact  Laboratory Data:  Results for orders placed or performed during the hospital encounter of 09/18/19 (from the past 24 hour(s))  Respiratory Panel by RT PCR (Flu A&B, Covid) - Nasopharyngeal Swab     Status: None   Collection Time: 09/18/19  8:58 AM   Specimen: Nasopharyngeal Swab  Result Value Ref Range   SARS Coronavirus 2 by RT PCR NEGATIVE NEGATIVE   Influenza A by PCR NEGATIVE NEGATIVE   Influenza B by PCR NEGATIVE NEGATIVE  Basic metabolic panel     Status: Abnormal   Collection Time: 09/18/19  9:05 AM  Result Value Ref Range   Sodium 142 135 - 145 mmol/L   Potassium 3.9 3.5 - 5.1 mmol/L   Chloride 106 98 - 111 mmol/L   CO2 26 22 - 32 mmol/L   Glucose, Bld 100 (H) 70 - 99 mg/dL   BUN 14 8 - 23 mg/dL   Creatinine, Ser 0.97 0.44 - 1.00 mg/dL   Calcium  8.8 (L) 8.9 - 10.3 mg/dL   GFR calc non Af Amer 56 (L) >60 mL/min   GFR calc Af Amer >60 >60 mL/min   Anion gap 10 5 - 15  CBC with Differential     Status: Abnormal   Collection Time: 09/18/19  9:05 AM  Result Value Ref Range   WBC 13.2 (H) 4.0 - 10.5 K/uL   RBC 3.73 (L) 3.87 - 5.11 MIL/uL   Hemoglobin 11.3 (L) 12.0 - 15.0 g/dL   HCT 36.8 36.0 - 46.0 %   MCV 98.7 80.0 - 100.0 fL   MCH 30.3 26.0 - 34.0 pg   MCHC  30.7 30.0 - 36.0 g/dL   RDW 16.4 (H) 11.5 - 15.5 %   Platelets 402 (H) 150 - 400 K/uL   nRBC 0.0 0.0 - 0.2 %   Neutrophils Relative % 66 %   Neutro Abs 8.8 (H) 1.7 - 7.7 K/uL   Lymphocytes Relative 27 %   Lymphs Abs 3.6 0.7 - 4.0 K/uL   Monocytes Relative 5 %   Monocytes Absolute 0.6 0.1 - 1.0 K/uL   Eosinophils Relative 2 %   Eosinophils Absolute 0.2 0.0 - 0.5 K/uL   Basophils Relative 0 %   Basophils Absolute 0.0 0.0 - 0.1 K/uL   Immature Granulocytes 0 %   Abs Immature Granulocytes 0.04 0.00 - 0.07 K/uL  Protime-INR     Status: None   Collection Time: 09/18/19  9:05 AM  Result Value Ref Range   Prothrombin Time 14.0 11.4 - 15.2 seconds   INR 1.1 0.8 - 1.2   Recent Results (from the past 240 hour(s))  Respiratory Panel by RT PCR (Flu A&B, Covid) - Nasopharyngeal Swab     Status: None   Collection Time: 09/18/19  8:58 AM   Specimen: Nasopharyngeal Swab  Result Value Ref Range Status   SARS Coronavirus 2 by RT PCR NEGATIVE NEGATIVE Final    Comment: (NOTE) SARS-CoV-2 target nucleic acids are NOT DETECTED. The SARS-CoV-2 RNA is generally detectable in upper respiratoy specimens during the acute phase of infection. The lowest concentration of SARS-CoV-2 viral copies this assay can detect is 131 copies/mL. A negative result does not preclude SARS-Cov-2 infection and should not be used as the sole basis for treatment or other patient management decisions. A negative result may occur with  improper specimen collection/handling, submission of specimen other than  nasopharyngeal swab, presence of viral mutation(s) within the areas targeted by this assay, and inadequate number of viral copies (<131 copies/mL). A negative result must be combined with clinical observations, patient history, and epidemiological information. The expected result is Negative. Fact Sheet for Patients:  PinkCheek.be Fact Sheet for Healthcare Providers:  GravelBags.it This test is not yet ap proved or cleared by the [place] FDA and  has been authorized for detection and/or diagnosis of SARS-CoV-2 by FDA under an Emergency Use Authorization (EUA). This EUA will remain  in effect (meaning this test can be used) for the duration of the COVID-19 declaration under Section 564(b)(1) of the Act, 21 U.S.C. section 360bbb-3(b)(1), unless the authorization is terminated or revoked sooner.    Influenza A by PCR NEGATIVE NEGATIVE Final   Influenza B by PCR NEGATIVE NEGATIVE Final    Comment: (NOTE) The Xpert Xpress SARS-CoV-2/FLU/RSV assay is intended as an aid in  the diagnosis of influenza from Nasopharyngeal swab specimens and  should not be used as a sole basis for treatment. Nasal washings and  aspirates are unacceptable for Xpert Xpress SARS-CoV-2/FLU/RSV  testing. Fact Sheet for Patients: PinkCheek.be Fact Sheet for Healthcare Providers: GravelBags.it This test is not yet approved or cleared by the [place] FDA and  has been authorized for detection and/or diagnosis of SARS-CoV-2 by  FDA under an Emergency Use Authorization (EUA). This EUA will remain  in effect (meaning this test can be used) for the duration of the  Covid-19 declaration under Section 564(b)(1) of the Act, 21  U.S.C. section 360bbb-3(b)(1), unless the authorization is  terminated or revoked. Performed at [facility], [place] [address], [place], [place] [address]     Creatinine: Recent Labs  09/18/19 0905  CREATININE 0.97    Impression/Assessment:  Right renal calculus  Plan:  Proceed with right PCNL.  Risks and benefits discussed.  Including but not limited to bleeding, infection, injury to surrounding structures, injury to the kidney, kidney loss, need for additional procedures, death, need for blood transfusion.  [name], III 09/18/2019, 11:19 AM

## 2019-09-18 NOTE — Discharge Instructions (Signed)
Discharge instructions following PCNL ° °Call your doctor for: °Fevers greater than 100.5 °Severe nausea or vomiting °Increasing pain not controlled by pain medication °Increasing redness or drainage from incisions °Decreased urine output or a catheter is no longer draining ° °The number for questions is [phone]. ° °Activity: °Gradually increase activity with short frequent walks, 3-4 times a day.  Avoid strenuous activities, like sports, lawn-mowing, or heavy lifting (more than 10-15 pounds).  Wear loose, comfortable clothing that pull or kink the tube or tubes.  Do not drive while taking pain medication, or until your doctor permitts it. ° °Bathing and dressing changes: °You should not shower for 48 hours after surgery.  Do not soak your back in a bathtub. ° °Drainage bag care: °You may be discharged with a drainage bag around the site of your surgery.  The drainage bag should be secured such that it never pulls or loosens to prevent it from leaking.  It is important to wash her hands before and after emptying the drainage bag to help prevent the spread of infection.  The drainage bag should be emptied as needed.  When the wound stops draining or it is manageable with a dry gauze dressing, you can remove the bag. ° °If your tube in the back was removed, you should expect to have some leakage of fluid from the back incision.  This should slowly decrease and stop over the next couple of days.  If you have severe pain or persistent leakage, please call the number above.  Otherwise, your dressing can be changed 1-2 times daily or more if needed. ° °Diet: °It is extremely important to drink plenty of fluids after surgery, especially water.  You may resume your regular diet, unless otherwise instructed. ° °Medications: °May take Tylenol (acetaminophen) or ibuprofen (Advil, Motrin) as directed over-the-counter. °Take any prescriptions as directed. ° °Follow-up appointments: °Follow-up appointment will be scheduled  with Dr. Rosary Filosa °

## 2019-09-18 NOTE — TOC Initial Note (Signed)
Transition of Care [facility]) - Initial/Assessment Note    Patient Details  Name: Danielle Dixon MRN: [id number] Date of Birth: 1941-02-26  [facility]) CM/SW Contact:    Dessa Phi, RN Phone Number: 09/18/2019, 3:43 PM  Clinical Narrative: From [facility] rep Diane aware-will need d/c summary @ d/c-fax to#[phone].                  Expected Discharge Plan: Memory Care Barriers to Discharge: Continued Medical Work up   Patient Goals and CMS Choice        Expected Discharge Plan and Services Expected Discharge Plan: Memory Care   Discharge Planning Services: CM Consult                                          Prior Living Arrangements/Services     Patient language and need for interpreter reviewed:: Yes Do you feel safe going back to the place where you live?: Yes      Need for Family Participation in Patient Care: No (Comment) Care giver support system in place?: Yes (comment)   Criminal Activity/Legal Involvement Pertinent to Current Situation/Hospitalization: No - Comment as needed  Activities of Daily Living Home Assistive Devices/Equipment: Built-in shower seat, Grab bars in shower, Raised toilet seat with rails ADL Screening (condition at time of admission) Patient's cognitive ability adequate to safely complete daily activities?: No Is the patient deaf or have difficulty hearing?: No Does the patient have difficulty seeing, even when wearing glasses/contacts?: No Does the patient have difficulty concentrating, remembering, or making decisions?: Yes Patient able to express need for assistance with ADLs?: Yes Does the patient have difficulty dressing or bathing?: Yes Independently performs ADLs?: No Communication: Independent Dressing (OT): Independent Grooming: Independent Feeding: Independent Bathing: Needs assistance Is this a change from baseline?: Pre-admission baseline Toileting: Independent In/Out Bed:  Independent Walks in Home: Independent Does the patient have difficulty walking or climbing stairs?: No Weakness of Legs: None Weakness of Arms/Hands: None  Permission Sought/Granted Permission sought to share information with : Case Manager Permission granted to share information with : Yes, Verbal Permission Granted  Share Information with NAME: [name] sister [phone]           Emotional Assessment Appearance:: Appears stated age Attitude/Demeanor/Rapport: Gracious Affect (typically observed): Accepting Orientation: : Oriented to Self Alcohol / Substance Use: Not Applicable Psych Involvement: No (comment)  Admission diagnosis:  Renal calculi [N20.0] Patient Active Problem List   Diagnosis Date Noted  . Renal calculi 09/18/2019  . Pressure injury of skin 07/14/2019  . Pyohydronephrosis 07/10/2019  . Sepsis ([place]) 07/10/2019  . Mild dementia ([place]) 07/10/2019  . Acute metabolic encephalopathy [id number]  . Rheumatoid arthritis ([place]) 07/10/2019  . Leukocytosis, unspecified 06/25/2013  . Dyspnea 06/25/2013  . Thrombocytosis ([place]) 06/25/2013  . Leukocytosis, unspecified 06/25/2013  . Need for prophylactic vaccination and inoculation against influenza 06/25/2013  . Unspecified vitamin D deficiency 06/25/2013   PCP:  Jonathon Jordan, MD Pharmacy:   [facility] [address] [place] [address] Phone: [phone] Fax: [phone]     Social Determinants of Health (SDOH) Interventions    Readmission Risk Interventions No flowsheet data found.

## 2019-09-18 NOTE — Anesthesia Preprocedure Evaluation (Addendum)
Anesthesia Evaluation  Patient identified by MRN, date of birth, ID bandGeneral Assessment Comment:Patient awake  Reviewed: Allergy & Precautions, NPO status , Patient's Chart, lab work & pertinent test results  Airway Mallampati: II  TM Distance: >3 FB Neck ROM: Full    Dental no notable dental hx.    Pulmonary COPD,  COPD inhaler,    Pulmonary exam normal breath sounds clear to auscultation       Cardiovascular hypertension, Pt. on home beta blockers Normal cardiovascular exam Rhythm:Regular Rate:Normal  ECG: rate 54. Sinus rhythm Left ventricular hypertrophy   Neuro/Psych PSYCHIATRIC DISORDERS Anxiety Depression Dementia negative neurological ROS     GI/Hepatic negative GI ROS, Neg liver ROS,   Endo/Other  negative endocrine ROS  Renal/GU negative Renal ROS     Musculoskeletal  (+) Arthritis , Rheumatoid disorders,    Abdominal   Peds  Hematology  (+) anemia , HLD   Anesthesia Other Findings RIGHT RENAL STONE  Reproductive/Obstetrics                           Anesthesia Physical Anesthesia Plan  ASA: III  Anesthesia Plan: General   Post-op Pain Management:    Induction: Intravenous  PONV Risk Score and Plan: 3 and Ondansetron, Dexamethasone and Treatment may vary due to age or medical condition  Airway Management Planned: Oral ETT  Additional Equipment:   Intra-op Plan:   Post-operative Plan: Extubation in OR  Informed Consent: I have reviewed the patients History and Physical, chart, labs and discussed the procedure including the risks, benefits and alternatives for the proposed anesthesia with the patient or authorized representative who has indicated his/her understanding and acceptance.     Consent reviewed with POA and Dental advisory given  Plan Discussed with: CRNA  Anesthesia Plan Comments: (Anesthetic plan discussed with sister)       Anesthesia Quick  Evaluation

## 2019-09-18 NOTE — Progress Notes (Signed)
Last dose of medications and NPO status confirmed with [place], RN at nursing home.

## 2019-09-18 NOTE — Progress Notes (Signed)
No atenolol given to pt due to HR 50.

## 2019-09-18 NOTE — Transfer of Care (Signed)
Immediate Anesthesia Transfer of Care Note  Patient: Danielle Dixon  Procedure(s) Performed: NEPHROLITHOTOMY PERCUTANEOUS (Right )  Patient Location: PACU  Anesthesia Type:General  Level of Consciousness: drowsy  Airway & Oxygen Therapy: Patient Spontanous Breathing and Patient connected to face mask oxygen  Post-op Assessment: Report given to RN and Post -op Vital signs reviewed and stable  Post vital signs: Reviewed and stable  Last Vitals:  Vitals Value Taken Time  BP 172/80 09/18/19 1340  Temp    Pulse 62 09/18/19 1341  Resp 15 09/18/19 1341  SpO2 100 % 09/18/19 1341  Vitals shown include unvalidated device data.  Last Pain:  Vitals:   09/18/19 0907  TempSrc:   PainSc: 0-No pain         Complications: No apparent anesthesia complications

## 2019-09-18 NOTE — Procedures (Signed)
Interventional Radiology Procedure Note  Procedure: Placement of a right nephro-ureteral catheter for nephrolithotomy.  Access into the calyx containing the right superior stone.  The 63F catheter is through a short accustick 63F sheath, and terminates in the bladder.  This will accept any 035wire to the bladder.  Complications: None  Recommendations:  - To the OR  Signed,  Dulcy Fanny. Earleen Newport, DO

## 2019-09-19 DIAGNOSIS — N200 Calculus of kidney: Secondary | ICD-10-CM | POA: Diagnosis not present

## 2019-09-19 LAB — HEMOGLOBIN AND HEMATOCRIT, BLOOD
HCT: 33.4 % — ABNORMAL LOW (ref 36.0–46.0)
HCT: 33.3 % — ABNORMAL LOW (ref 36.0–46.0)
Hemoglobin: 10.2 g/dL — ABNORMAL LOW (ref 12.0–15.0)
Hemoglobin: 10.1 g/dL — ABNORMAL LOW (ref 12.0–15.0)

## 2019-09-19 LAB — BASIC METABOLIC PANEL
Anion gap: 8 (ref 5–15)
BUN: 19 mg/dL (ref 8–23)
CO2: 26 mmol/L (ref 22–32)
Calcium: 8.6 mg/dL — ABNORMAL LOW (ref 8.9–10.3)
Chloride: 106 mmol/L (ref 98–111)
Creatinine, Ser: 1.36 mg/dL — ABNORMAL HIGH (ref 0.44–1.00)
GFR calc Af Amer: 43 mL/min — ABNORMAL LOW (ref >60)
GFR calc non Af Amer: 37 mL/min — ABNORMAL LOW (ref >60)
Glucose, Bld: 161 mg/dL — ABNORMAL HIGH (ref 70–99)
Potassium: 4.3 mmol/L (ref 3.5–5.1)
Sodium: 140 mmol/L (ref 135–145)

## 2019-09-19 NOTE — TOC Transition Note (Deleted)
Transition of Care [facility]) - CM/SW Discharge Note   Patient Details  Name: Jailynne Kuo MRN: [id number] Date of Birth: 03/03/1941  [facility] CM/SW Contact:  Dessa Phi, RN Phone Number: 09/19/2019, 3:08 PM   Clinical Narrative:  PTAR called for d/c back w/[facility]Nsg aware.     Final next level of care: Memory Care Barriers to Discharge: No Barriers Identified   Patient Goals and CMS Choice        Discharge Placement              Patient chooses bed at: Other - please specify in the comment section below:([facility] memory care) Patient to be transferred to facility by: [place] Name of family member notified: [name] sister [phone] Patient and family notified of of transfer: 09/19/19  Discharge Plan and Services   Discharge Planning Services: CM Consult                      HH Arranged: PT [facility] Agency: [facility] Date [place]: 09/19/19 Time [place]: 1508 Representative spoke with at [place]: Amy  Social Determinants of Health ([place]) Interventions     Readmission Risk Interventions No flowsheet data found.

## 2019-09-19 NOTE — Progress Notes (Addendum)
Pt discharging back to [facility] today per Dr. Gloriann Loan. Pt's IV site D/C'd and WDL. Pt's VSS. AVS printed and placed in D/C packet. Report called to Gerald Stabs, Therapist, sports. Verbalized understanding. Sister, [name], updated on discharge plan. Verbalized understanding. Pt is currently awaiting on PTAR to transport back to [facility].

## 2019-09-19 NOTE — TOC Transition Note (Signed)
Transition of Care [facility]) - CM/SW Discharge Note   Patient Details  Name: Danielle Dixon MRN: [id number] Date of Birth: 08-20-1941  Transition of Care [facility]) CM/SW Contact:  Dessa Phi, RN Phone Number: 09/19/2019, 3:49 PM   Clinical Narrative:  Called [facility] aware.report tel#[phone].     Final next level of care: Memory Care Barriers to Discharge: No Barriers Identified   Patient Goals and CMS Choice        Discharge Placement              Patient chooses bed at: Other - please specify in the comment section below:([facility] memory care) Patient to be transferred to facility by: [place] Name of family member notified: [name] sister [phone] Patient and family notified of of transfer: 09/19/19  Discharge Plan and Services   Discharge Planning Services: CM Consult                      HH Arranged: PT [facility] Agency: [facility] Date [place]: 09/19/19 Time [place]: 1508 Representative spoke with at [place]: Amy  Social Determinants of Health ([place]) Interventions     Readmission Risk Interventions No flowsheet data found.

## 2019-09-19 NOTE — Discharge Summary (Signed)
Physician Discharge Summary  Patient ID: Danielle Dixon MRN: [id number] DOB/AGE: April 25, 1941 79 y.o.  Admit date: 09/18/2019 Discharge date: 09/19/2019  Admission Diagnoses:  Discharge Diagnoses:  Active Problems:   Renal calculi   Discharged Condition: good  Hospital Course: Patient underwent a right PCNL yesterday.  She tolerated the procedure well was stable postoperatively.  She remained overnight  For observation.  She was ready for discharge the following day.  Consults: None  Significant Diagnostic Studies:   None  Treatments: surgery:  As above  Discharge Exam: Blood pressure (!) 144/80, pulse 84, temperature 99.4 F (37.4 C), temperature source Oral, resp. rate 17, height 5\' 5"  (1.651 m), weight 60.6 kg, SpO2 100 %. General appearance: alert no acute distress  abdomen soft, nontender, nondistended  nephrostomy tube site clean dry and intact, no infection.  Disposition: Discharge disposition: 01-Home or Self Care        Allergies as of 09/19/2019   No Known Allergies     Medication List    TAKE these medications   albuterol 108 (90 Base) MCG/ACT inhaler Commonly known as: VENTOLIN HFA Inhale 2 puffs into the lungs 4 (four) times daily. (0900, 1200, 1600, & 2000)   ALPRAZolam 0.25 MG tablet Commonly known as: XANAX Take 0.25 mg by mouth daily as needed for anxiety.   aspirin 81 MG chewable tablet Chew 81 mg by mouth daily. (0900)   atenolol 25 MG tablet Commonly known as: TENORMIN Take 25 mg by mouth daily. (0900)   cholecalciferol 1000 units tablet Commonly known as: VITAMIN D Take 1 tablet (1,000 Units total) by mouth daily. What changed: additional instructions   clobetasol cream 0.05 % Commonly known as: TEMOVATE Apply 1 application topically 2 (two) times daily as needed (rash).   donepezil 10 MG tablet Commonly known as: ARICEPT Take 10 mg by mouth at bedtime. (2100)   Dry Eye Relief Drops 0.2-0.2-1 % Soln Generic drug:  Glycerin-Hypromellose-PEG 400 Place 1 drop into both eyes 4 (four) times daily as needed (dry eyes).   Ensure Take 237 mLs by mouth 3 (three) times daily. (0800, 0000000, & 123XX123)   folic acid 1 MG tablet Commonly known as: FOLVITE Take 1 mg by mouth daily. (0900)   guaifenesin 100 MG/5ML syrup Commonly known as: ROBITUSSIN Take 200 mg by mouth 4 (four) times daily as needed for cough.   HYDROcodone-acetaminophen 5-325 MG tablet Commonly known as: Norco Take 1 tablet by mouth every 4 (four) hours as needed for moderate pain.   methotrexate 2.5 MG tablet Commonly known as: RHEUMATREX Take 17.5 mg by mouth every Wednesday. (0900)   PARoxetine 25 MG 24 hr tablet Commonly known as: PAXIL-CR Take 25 mg by mouth daily. (0600)   polyethylene glycol 17 g packet Commonly known as: MIRALAX / GLYCOLAX Take 17 g by mouth daily as needed for moderate constipation.   simvastatin 10 MG tablet Commonly known as: ZOCOR Take 10 mg by mouth at bedtime. (2100)   triamcinolone cream 0.1 % Commonly known as: KENALOG Apply 1 application topically 2 (two) times daily as needed (psoriasis/rash).   vitamin B-12 500 MCG tablet Commonly known as: CYANOCOBALAMIN Take 500 mcg by mouth daily. (0900)   zafirlukast 20 MG tablet Commonly known as: ACCOLATE Take 20 mg by mouth 2 (two) times daily. (0900 & 2100)        Signed: [name], III 09/19/2019, 2:09 PM

## 2019-09-19 NOTE — Care Management Obs Status (Signed)
[facility] NOTIFICATION   Patient Details  Name: Harmoney Mclellan MRN: [id number] Date of Birth: 10-01-1940   Medicare Observation Status Notification Given:  Yes    MahabirJuliann Pulse, RN 09/19/2019, 12:36 PM

## 2019-09-19 NOTE — Plan of Care (Signed)

## 2019-09-19 NOTE — TOC Transition Note (Signed)
Transition of Care [facility]) - CM/SW Discharge Note   Patient Details  Name: Danielle Dixon MRN: [id number] Date of Birth: [birth year]  Transition of Care [facility]) CM/SW Contact:  Dessa Phi, RN Phone Number: 09/19/2019, 1:16 PM   Clinical Narrative: [facility] care rep Diane aware of return back-awaiting d/c summary to fax to #[phone].      Final next level of care: Memory Care Barriers to Discharge: No Barriers Identified   Patient Goals and CMS Choice        Discharge Placement              Patient chooses bed at: Other - please specify in the comment section below:([facility] memory [facility]) Patient to be transferred to facility by: [place] Name of family member notified: [name] sister [phone] Patient and family notified of of transfer: 09/19/19  Discharge Plan and Services   Discharge Planning Services: CM Consult                                 Social Determinants of Health (SDOH) Interventions     Readmission Risk Interventions No flowsheet data found.

## 2019-09-19 NOTE — TOC Transition Note (Signed)
Transition of Care [facility]) - CM/SW Discharge Note   Patient Details  Name: Danielle Dixon MRN: [id number] Date of Birth: 1940-12-03  Transition of Care [facility]) CM/SW Contact:  Dessa Phi, RN Phone Number: 09/19/2019, 2:41 PM   Clinical Narrative: Awaiting HHPT order face to face-[facility] rep-Amy following. PTAR to be called once [facility] is set up.       Final next level of care: Memory Care Barriers to Discharge: No Barriers Identified   Patient Goals and CMS Choice        Discharge Placement              Patient chooses bed at: Other - please specify in the comment section below:([facility] memory [facility]) Patient to be transferred to facility by: [place] Name of family member notified: [name] sister [phone] Patient and family notified of of transfer: 09/19/19  Discharge Plan and Services   Discharge Planning Services: CM Consult                                 Social Determinants of Health (SDOH) Interventions     Readmission Risk Interventions No flowsheet data found.

## 2019-09-20 DIAGNOSIS — R5381 Other malaise: Secondary | ICD-10-CM | POA: Diagnosis not present

## 2019-09-20 DIAGNOSIS — M2550 Pain in unspecified joint: Secondary | ICD-10-CM | POA: Diagnosis not present

## 2019-09-20 DIAGNOSIS — Z7401 Bed confinement status: Secondary | ICD-10-CM | POA: Diagnosis not present

## 2019-09-20 NOTE — Progress Notes (Signed)
Belongings noticed in PACU lockers with label. [employer] called, no answer. Pt's sister, Danielle Dixon notified. Danielle Dixon to come on 09/21/2019 to pick up belongings. Given number to call. Danielle Dixon to call number to have belongings transported to her car at main entrance.  Roselind Rily 09/20/2019 1845

## 2019-09-20 NOTE — Progress Notes (Signed)
Pt transported back to [facility] via [place] @ [place].

## 2019-09-24 DIAGNOSIS — M069 Rheumatoid arthritis, unspecified: Secondary | ICD-10-CM | POA: Diagnosis not present

## 2019-09-24 DIAGNOSIS — R54 Age-related physical debility: Secondary | ICD-10-CM | POA: Diagnosis not present

## 2019-09-24 DIAGNOSIS — I872 Venous insufficiency (chronic) (peripheral): Secondary | ICD-10-CM | POA: Diagnosis not present

## 2019-09-24 DIAGNOSIS — J449 Chronic obstructive pulmonary disease, unspecified: Secondary | ICD-10-CM | POA: Diagnosis not present

## 2019-09-24 DIAGNOSIS — Z20828 Contact with and (suspected) exposure to other viral communicable diseases: Secondary | ICD-10-CM | POA: Diagnosis not present

## 2019-09-24 DIAGNOSIS — F0280 Dementia in other diseases classified elsewhere without behavioral disturbance: Secondary | ICD-10-CM | POA: Diagnosis not present

## 2019-09-24 DIAGNOSIS — F419 Anxiety disorder, unspecified: Secondary | ICD-10-CM | POA: Diagnosis not present

## 2019-09-24 DIAGNOSIS — N200 Calculus of kidney: Secondary | ICD-10-CM | POA: Diagnosis not present

## 2019-09-24 DIAGNOSIS — F339 Major depressive disorder, recurrent, unspecified: Secondary | ICD-10-CM | POA: Diagnosis not present

## 2019-09-24 DIAGNOSIS — E559 Vitamin D deficiency, unspecified: Secondary | ICD-10-CM | POA: Diagnosis not present

## 2019-09-24 DIAGNOSIS — R1310 Dysphagia, unspecified: Secondary | ICD-10-CM | POA: Diagnosis not present

## 2019-09-24 DIAGNOSIS — G309 Alzheimer's disease, unspecified: Secondary | ICD-10-CM | POA: Diagnosis not present

## 2019-09-25 DIAGNOSIS — R1310 Dysphagia, unspecified: Secondary | ICD-10-CM | POA: Diagnosis not present

## 2019-09-25 DIAGNOSIS — N200 Calculus of kidney: Secondary | ICD-10-CM | POA: Diagnosis not present

## 2019-09-25 DIAGNOSIS — Z20828 Contact with and (suspected) exposure to other viral communicable diseases: Secondary | ICD-10-CM | POA: Diagnosis not present

## 2019-09-25 DIAGNOSIS — M6281 Muscle weakness (generalized): Secondary | ICD-10-CM | POA: Diagnosis not present

## 2019-09-25 DIAGNOSIS — F0281 Dementia in other diseases classified elsewhere with behavioral disturbance: Secondary | ICD-10-CM | POA: Diagnosis not present

## 2019-09-25 DIAGNOSIS — M069 Rheumatoid arthritis, unspecified: Secondary | ICD-10-CM | POA: Diagnosis not present

## 2019-09-26 DIAGNOSIS — F39 Unspecified mood [affective] disorder: Secondary | ICD-10-CM | POA: Diagnosis not present

## 2019-09-26 DIAGNOSIS — F419 Anxiety disorder, unspecified: Secondary | ICD-10-CM | POA: Diagnosis not present

## 2019-09-26 DIAGNOSIS — F0390 Unspecified dementia without behavioral disturbance: Secondary | ICD-10-CM | POA: Diagnosis not present

## 2019-10-02 DIAGNOSIS — N202 Calculus of kidney with calculus of ureter: Secondary | ICD-10-CM | POA: Diagnosis not present

## 2019-10-24 DIAGNOSIS — F0390 Unspecified dementia without behavioral disturbance: Secondary | ICD-10-CM | POA: Diagnosis not present

## 2019-10-24 DIAGNOSIS — F39 Unspecified mood [affective] disorder: Secondary | ICD-10-CM | POA: Diagnosis not present

## 2019-10-24 DIAGNOSIS — F419 Anxiety disorder, unspecified: Secondary | ICD-10-CM | POA: Diagnosis not present

## 2019-10-30 DIAGNOSIS — N202 Calculus of kidney with calculus of ureter: Secondary | ICD-10-CM | POA: Diagnosis not present

## 2019-10-31 DIAGNOSIS — Z23 Encounter for immunization: Secondary | ICD-10-CM | POA: Diagnosis not present

## 2019-11-21 DIAGNOSIS — F39 Unspecified mood [affective] disorder: Secondary | ICD-10-CM | POA: Diagnosis not present

## 2019-11-21 DIAGNOSIS — F0390 Unspecified dementia without behavioral disturbance: Secondary | ICD-10-CM | POA: Diagnosis not present

## 2019-11-21 DIAGNOSIS — F419 Anxiety disorder, unspecified: Secondary | ICD-10-CM | POA: Diagnosis not present

## 2019-11-28 DIAGNOSIS — Z23 Encounter for immunization: Secondary | ICD-10-CM | POA: Diagnosis not present

## 2019-12-19 DIAGNOSIS — R627 Adult failure to thrive: Secondary | ICD-10-CM | POA: Diagnosis not present

## 2019-12-19 DIAGNOSIS — F419 Anxiety disorder, unspecified: Secondary | ICD-10-CM | POA: Diagnosis not present

## 2019-12-19 DIAGNOSIS — F0390 Unspecified dementia without behavioral disturbance: Secondary | ICD-10-CM | POA: Diagnosis not present

## 2019-12-19 DIAGNOSIS — F39 Unspecified mood [affective] disorder: Secondary | ICD-10-CM | POA: Diagnosis not present

## 2020-01-23 DIAGNOSIS — F419 Anxiety disorder, unspecified: Secondary | ICD-10-CM | POA: Diagnosis not present

## 2020-01-23 DIAGNOSIS — F39 Unspecified mood [affective] disorder: Secondary | ICD-10-CM | POA: Diagnosis not present

## 2020-01-23 DIAGNOSIS — F0390 Unspecified dementia without behavioral disturbance: Secondary | ICD-10-CM | POA: Diagnosis not present

## 2020-01-23 DIAGNOSIS — R627 Adult failure to thrive: Secondary | ICD-10-CM | POA: Diagnosis not present

## 2020-02-13 DIAGNOSIS — B351 Tinea unguium: Secondary | ICD-10-CM | POA: Diagnosis not present

## 2020-02-13 DIAGNOSIS — Q845 Enlarged and hypertrophic nails: Secondary | ICD-10-CM | POA: Diagnosis not present

## 2020-02-13 DIAGNOSIS — I739 Peripheral vascular disease, unspecified: Secondary | ICD-10-CM | POA: Diagnosis not present

## 2020-02-13 DIAGNOSIS — L84 Corns and callosities: Secondary | ICD-10-CM | POA: Diagnosis not present

## 2020-02-26 DIAGNOSIS — F0281 Dementia in other diseases classified elsewhere with behavioral disturbance: Secondary | ICD-10-CM | POA: Diagnosis not present

## 2020-02-26 DIAGNOSIS — F419 Anxiety disorder, unspecified: Secondary | ICD-10-CM | POA: Diagnosis not present

## 2020-02-26 DIAGNOSIS — F39 Unspecified mood [affective] disorder: Secondary | ICD-10-CM | POA: Diagnosis not present

## 2020-02-26 DIAGNOSIS — F0390 Unspecified dementia without behavioral disturbance: Secondary | ICD-10-CM | POA: Diagnosis not present

## 2020-02-26 DIAGNOSIS — I10 Essential (primary) hypertension: Secondary | ICD-10-CM | POA: Diagnosis not present

## 2020-02-26 DIAGNOSIS — R609 Edema, unspecified: Secondary | ICD-10-CM | POA: Diagnosis not present

## 2020-02-26 DIAGNOSIS — M79671 Pain in right foot: Secondary | ICD-10-CM | POA: Diagnosis not present

## 2020-02-28 DIAGNOSIS — M79671 Pain in right foot: Secondary | ICD-10-CM | POA: Diagnosis not present

## 2020-02-28 DIAGNOSIS — N200 Calculus of kidney: Secondary | ICD-10-CM | POA: Diagnosis not present

## 2020-03-04 DIAGNOSIS — M19071 Primary osteoarthritis, right ankle and foot: Secondary | ICD-10-CM | POA: Diagnosis not present

## 2020-03-04 DIAGNOSIS — M6281 Muscle weakness (generalized): Secondary | ICD-10-CM | POA: Diagnosis not present

## 2020-03-04 DIAGNOSIS — R21 Rash and other nonspecific skin eruption: Secondary | ICD-10-CM | POA: Diagnosis not present

## 2020-03-12 DIAGNOSIS — Z79899 Other long term (current) drug therapy: Secondary | ICD-10-CM | POA: Diagnosis not present

## 2020-03-12 DIAGNOSIS — D519 Vitamin B12 deficiency anemia, unspecified: Secondary | ICD-10-CM | POA: Diagnosis not present

## 2020-03-12 DIAGNOSIS — Z131 Encounter for screening for diabetes mellitus: Secondary | ICD-10-CM | POA: Diagnosis not present

## 2020-03-12 DIAGNOSIS — I10 Essential (primary) hypertension: Secondary | ICD-10-CM | POA: Diagnosis not present

## 2020-03-14 DIAGNOSIS — E7800 Pure hypercholesterolemia, unspecified: Secondary | ICD-10-CM | POA: Diagnosis not present

## 2020-03-14 DIAGNOSIS — M069 Rheumatoid arthritis, unspecified: Secondary | ICD-10-CM | POA: Diagnosis not present

## 2020-03-14 DIAGNOSIS — F0390 Unspecified dementia without behavioral disturbance: Secondary | ICD-10-CM | POA: Diagnosis not present

## 2020-03-14 DIAGNOSIS — M068A Other specified rheumatoid arthritis, other specified site: Secondary | ICD-10-CM | POA: Diagnosis not present

## 2020-03-14 DIAGNOSIS — Z7982 Long term (current) use of aspirin: Secondary | ICD-10-CM | POA: Diagnosis not present

## 2020-03-14 DIAGNOSIS — I10 Essential (primary) hypertension: Secondary | ICD-10-CM | POA: Diagnosis not present

## 2020-03-14 DIAGNOSIS — R32 Unspecified urinary incontinence: Secondary | ICD-10-CM | POA: Diagnosis not present

## 2020-03-14 DIAGNOSIS — Z9181 History of falling: Secondary | ICD-10-CM | POA: Diagnosis not present

## 2020-03-25 DIAGNOSIS — S51802A Unspecified open wound of left forearm, initial encounter: Secondary | ICD-10-CM | POA: Diagnosis not present

## 2020-03-25 DIAGNOSIS — F0391 Unspecified dementia with behavioral disturbance: Secondary | ICD-10-CM | POA: Diagnosis not present

## 2020-03-30 DIAGNOSIS — F39 Unspecified mood [affective] disorder: Secondary | ICD-10-CM | POA: Diagnosis not present

## 2020-03-30 DIAGNOSIS — F0390 Unspecified dementia without behavioral disturbance: Secondary | ICD-10-CM | POA: Diagnosis not present

## 2020-03-30 DIAGNOSIS — F419 Anxiety disorder, unspecified: Secondary | ICD-10-CM | POA: Diagnosis not present

## 2020-04-13 DIAGNOSIS — M068A Other specified rheumatoid arthritis, other specified site: Secondary | ICD-10-CM | POA: Diagnosis not present

## 2020-04-13 DIAGNOSIS — M069 Rheumatoid arthritis, unspecified: Secondary | ICD-10-CM | POA: Diagnosis not present

## 2020-04-13 DIAGNOSIS — Z9181 History of falling: Secondary | ICD-10-CM | POA: Diagnosis not present

## 2020-04-13 DIAGNOSIS — Z7982 Long term (current) use of aspirin: Secondary | ICD-10-CM | POA: Diagnosis not present

## 2020-04-13 DIAGNOSIS — F0390 Unspecified dementia without behavioral disturbance: Secondary | ICD-10-CM | POA: Diagnosis not present

## 2020-04-13 DIAGNOSIS — R32 Unspecified urinary incontinence: Secondary | ICD-10-CM | POA: Diagnosis not present

## 2020-04-13 DIAGNOSIS — E7800 Pure hypercholesterolemia, unspecified: Secondary | ICD-10-CM | POA: Diagnosis not present

## 2020-04-13 DIAGNOSIS — I10 Essential (primary) hypertension: Secondary | ICD-10-CM | POA: Diagnosis not present

## 2020-05-14 DIAGNOSIS — F0390 Unspecified dementia without behavioral disturbance: Secondary | ICD-10-CM | POA: Diagnosis not present

## 2020-05-14 DIAGNOSIS — L409 Psoriasis, unspecified: Secondary | ICD-10-CM | POA: Diagnosis not present

## 2020-05-14 DIAGNOSIS — E785 Hyperlipidemia, unspecified: Secondary | ICD-10-CM | POA: Diagnosis not present

## 2020-05-14 DIAGNOSIS — J439 Emphysema, unspecified: Secondary | ICD-10-CM | POA: Diagnosis not present

## 2020-05-14 DIAGNOSIS — J4520 Mild intermittent asthma, uncomplicated: Secondary | ICD-10-CM | POA: Diagnosis not present

## 2020-05-14 DIAGNOSIS — F324 Major depressive disorder, single episode, in partial remission: Secondary | ICD-10-CM | POA: Diagnosis not present

## 2020-05-14 DIAGNOSIS — I10 Essential (primary) hypertension: Secondary | ICD-10-CM | POA: Diagnosis not present

## 2020-05-14 DIAGNOSIS — Z0000 Encounter for general adult medical examination without abnormal findings: Secondary | ICD-10-CM | POA: Diagnosis not present

## 2020-06-04 DIAGNOSIS — L409 Psoriasis, unspecified: Secondary | ICD-10-CM | POA: Diagnosis not present

## 2020-06-04 DIAGNOSIS — G309 Alzheimer's disease, unspecified: Secondary | ICD-10-CM | POA: Diagnosis not present

## 2020-06-04 DIAGNOSIS — I10 Essential (primary) hypertension: Secondary | ICD-10-CM | POA: Diagnosis not present

## 2020-06-04 DIAGNOSIS — F324 Major depressive disorder, single episode, in partial remission: Secondary | ICD-10-CM | POA: Diagnosis not present

## 2020-06-05 DIAGNOSIS — Z23 Encounter for immunization: Secondary | ICD-10-CM | POA: Diagnosis not present

## 2020-06-23 DIAGNOSIS — L400 Psoriasis vulgaris: Secondary | ICD-10-CM | POA: Diagnosis not present

## 2020-06-23 DIAGNOSIS — Z79899 Other long term (current) drug therapy: Secondary | ICD-10-CM | POA: Diagnosis not present

## 2020-09-13 DIAGNOSIS — B354 Tinea corporis: Secondary | ICD-10-CM | POA: Diagnosis not present

## 2020-09-13 DIAGNOSIS — L03313 Cellulitis of chest wall: Secondary | ICD-10-CM | POA: Diagnosis not present

## 2020-09-21 DIAGNOSIS — J069 Acute upper respiratory infection, unspecified: Secondary | ICD-10-CM | POA: Diagnosis not present

## 2020-09-21 DIAGNOSIS — Z03818 Encounter for observation for suspected exposure to other biological agents ruled out: Secondary | ICD-10-CM | POA: Diagnosis not present

## 2020-09-28 IMAGING — XA IR URETURAL STENT RIGHT NEW ACCESS W/O SEP NEPHROSTOMY CATH
2 series · 7 of 7 positions shown · non-contrast
Comparison: None.

INDICATION: 78-year-old female with a history of right ureteral stone.

She presents today for percutaneous access for pending surgical
nephrolithotomy
EXAM:
IR URETURAL STENT RIGHT NEW ACCESS W/O SEP NEPHROSTOMY CATH

[Series 2: care single · 1 of 1 slices shown]
[im 1/1]
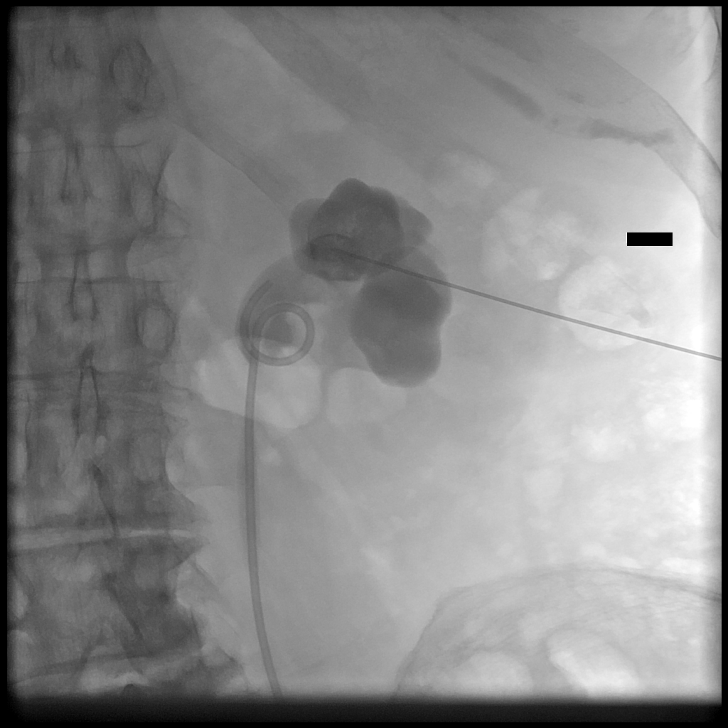

[Series 300: ir ureteral stent right new access w/o s · non-contrast · 6 of 6 slices shown]
[im 1/6]
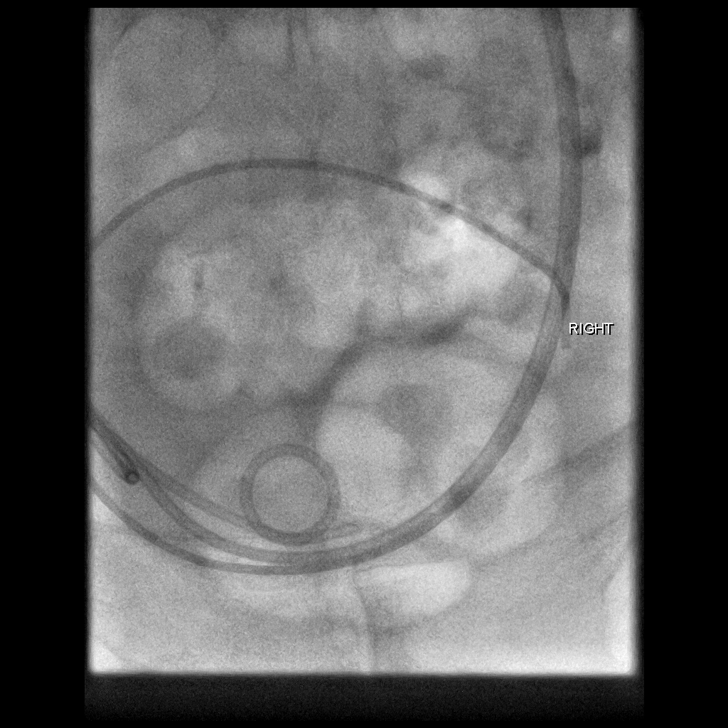
[im 2/6]
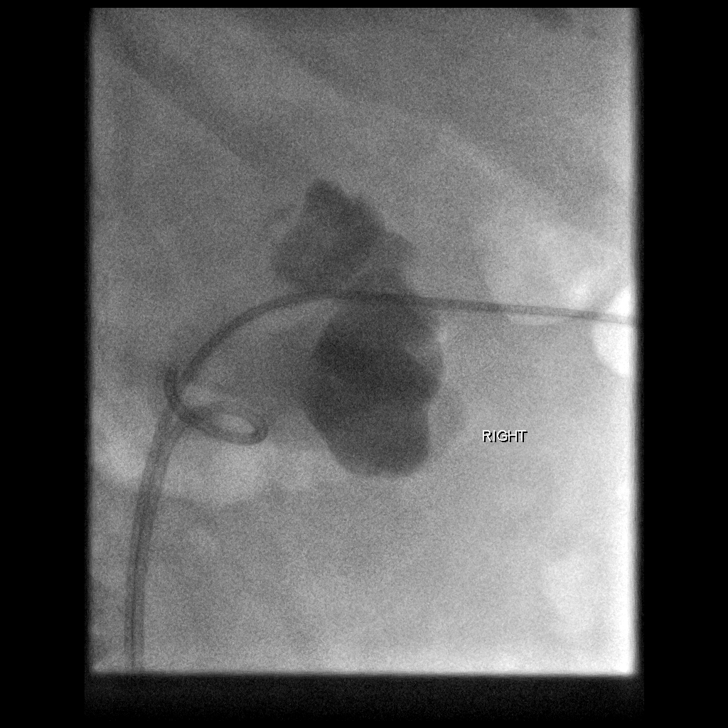
[im 3/6]
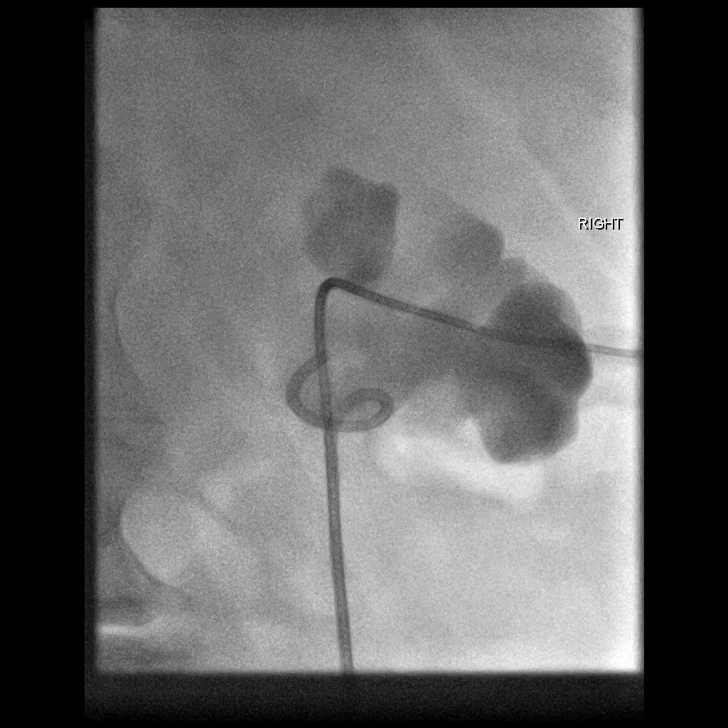
[im 4/6]
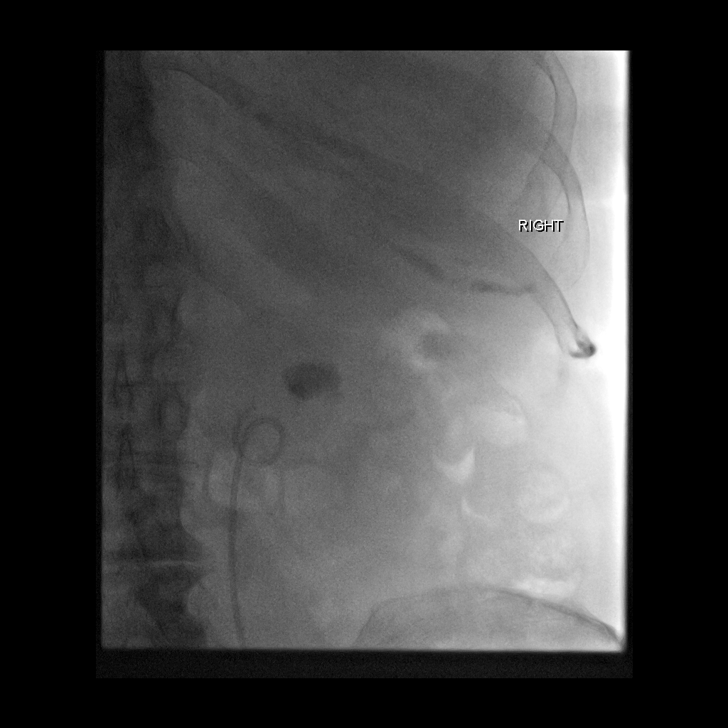
[im 5/6]
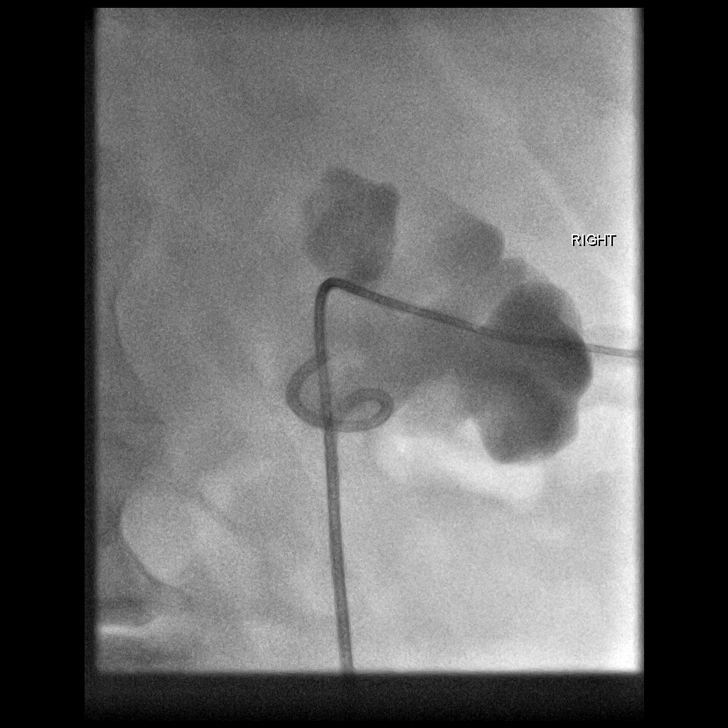
[im 6/6]
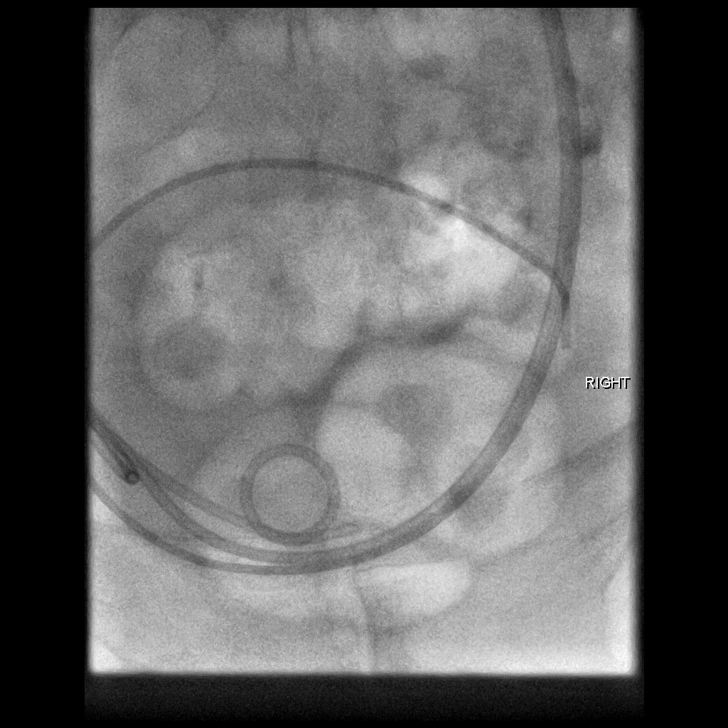

[7 of 7 positions shown; findings below may reference images not displayed]

MEDICATIONS:
2 g Ancef; The antibiotic was administered in an appropriate time
frame prior to skin puncture.

ANESTHESIA/SEDATION:
Fentanyl 50 mcg IV; Versed 1.0 mg IV

Moderate Sedation Time:  17 minutes

The patient was continuously monitored during the procedure by the
interventional radiology nurse under my direct supervision.

CONTRAST:  8mL OMNIPAQUE IOHEXOL 300 MG/ML SOLN - administered into
the collecting system(s)

FLUOROSCOPY TIME:  Fluoroscopy Time: 3 minutes 36 seconds (43 mGy).

COMPLICATIONS:
None

PROCEDURE:
Informed written consent was obtained from the patient after a
thorough discussion of the procedural risks, benefits and
alternatives. All questions were addressed. Maximal Sterile Barrier
Technique was utilized including caps, mask, sterile gowns, sterile
gloves, sterile drape, hand hygiene and skin antiseptic. A timeout
was performed prior to the initiation of the procedure.

Patient position prone position on the fluoroscopy table. Time-out
was performed.

Initial ultrasound and plain film images were performed.

1% lidocaine was used for local anesthesia.

Using ultrasound guidance, needle was advanced in a superior
posterior trajectory targeting the stone in the upper pole
collecting system.

Once we confirmed needle tip position with a small amount of
contrast, a Nitrex wire was advanced into the collecting system.
Needle was removed and a small stab incision was made.

Accustick system was advanced into the collecting system.

Using a combination of a 4 French glide catheter and a Glidewire,
the catheter was navigated through the superior calyx into the
pelvis and down the ureter beyond the stent.

The wire was removed and contrast confirmed location within the
urinary bladder.

Final images were stored.

The catheter was sutured in location.

Patient tolerated the procedure well and remained hemodynamically
stable throughout.

No complications were encountered and no significant blood loss.
IMPRESSION: Status post image guided placement of right-sided percutaneous
nephroureteral catheter for access for pending nephrolithotomy.

PLAN:
NPO for the operating room

The indwelling 4 French catheter traverses the superior posterior
calyx containing the stone, and the catheter terminates in the
urinary bladder. The catheter will accept any 035 wire.

## 2020-10-26 DIAGNOSIS — Z79899 Other long term (current) drug therapy: Secondary | ICD-10-CM | POA: Diagnosis not present

## 2020-10-26 DIAGNOSIS — L400 Psoriasis vulgaris: Secondary | ICD-10-CM | POA: Diagnosis not present

## 2020-10-26 DIAGNOSIS — L42 Pityriasis rosea: Secondary | ICD-10-CM | POA: Diagnosis not present

## 2020-12-18 DIAGNOSIS — R059 Cough, unspecified: Secondary | ICD-10-CM | POA: Diagnosis not present

## 2020-12-18 DIAGNOSIS — I10 Essential (primary) hypertension: Secondary | ICD-10-CM | POA: Diagnosis not present

## 2020-12-18 DIAGNOSIS — F324 Major depressive disorder, single episode, in partial remission: Secondary | ICD-10-CM | POA: Diagnosis not present

## 2020-12-18 DIAGNOSIS — Z79899 Other long term (current) drug therapy: Secondary | ICD-10-CM | POA: Diagnosis not present

## 2020-12-18 DIAGNOSIS — L409 Psoriasis, unspecified: Secondary | ICD-10-CM | POA: Diagnosis not present

## 2021-01-25 DIAGNOSIS — L400 Psoriasis vulgaris: Secondary | ICD-10-CM | POA: Diagnosis not present

## 2021-01-25 DIAGNOSIS — Z79899 Other long term (current) drug therapy: Secondary | ICD-10-CM | POA: Diagnosis not present

## 2021-03-01 DIAGNOSIS — R251 Tremor, unspecified: Secondary | ICD-10-CM | POA: Diagnosis not present

## 2021-03-01 DIAGNOSIS — R0781 Pleurodynia: Secondary | ICD-10-CM | POA: Diagnosis not present

## 2021-03-01 DIAGNOSIS — W19XXXA Unspecified fall, initial encounter: Secondary | ICD-10-CM | POA: Diagnosis not present

## 2021-03-01 DIAGNOSIS — R296 Repeated falls: Secondary | ICD-10-CM | POA: Diagnosis not present

## 2021-03-04 DIAGNOSIS — Z87440 Personal history of urinary (tract) infections: Secondary | ICD-10-CM | POA: Diagnosis not present

## 2021-03-04 DIAGNOSIS — K5909 Other constipation: Secondary | ICD-10-CM | POA: Diagnosis not present

## 2021-03-04 DIAGNOSIS — F419 Anxiety disorder, unspecified: Secondary | ICD-10-CM | POA: Diagnosis not present

## 2021-03-04 DIAGNOSIS — D519 Vitamin B12 deficiency anemia, unspecified: Secondary | ICD-10-CM | POA: Diagnosis not present

## 2021-03-04 DIAGNOSIS — M47814 Spondylosis without myelopathy or radiculopathy, thoracic region: Secondary | ICD-10-CM | POA: Diagnosis not present

## 2021-03-04 DIAGNOSIS — J439 Emphysema, unspecified: Secondary | ICD-10-CM | POA: Diagnosis not present

## 2021-03-04 DIAGNOSIS — M859 Disorder of bone density and structure, unspecified: Secondary | ICD-10-CM | POA: Diagnosis not present

## 2021-03-04 DIAGNOSIS — G309 Alzheimer's disease, unspecified: Secondary | ICD-10-CM | POA: Diagnosis not present

## 2021-03-04 DIAGNOSIS — Z96659 Presence of unspecified artificial knee joint: Secondary | ICD-10-CM | POA: Diagnosis not present

## 2021-03-04 DIAGNOSIS — M1711 Unilateral primary osteoarthritis, right knee: Secondary | ICD-10-CM | POA: Diagnosis not present

## 2021-03-04 DIAGNOSIS — Z9181 History of falling: Secondary | ICD-10-CM | POA: Diagnosis not present

## 2021-03-04 DIAGNOSIS — F324 Major depressive disorder, single episode, in partial remission: Secondary | ICD-10-CM | POA: Diagnosis not present

## 2021-03-04 DIAGNOSIS — I10 Essential (primary) hypertension: Secondary | ICD-10-CM | POA: Diagnosis not present

## 2021-03-04 DIAGNOSIS — M069 Rheumatoid arthritis, unspecified: Secondary | ICD-10-CM | POA: Diagnosis not present

## 2021-03-04 DIAGNOSIS — D72829 Elevated white blood cell count, unspecified: Secondary | ICD-10-CM | POA: Diagnosis not present

## 2021-03-04 DIAGNOSIS — L409 Psoriasis, unspecified: Secondary | ICD-10-CM | POA: Diagnosis not present

## 2021-03-04 DIAGNOSIS — I739 Peripheral vascular disease, unspecified: Secondary | ICD-10-CM | POA: Diagnosis not present

## 2021-03-04 DIAGNOSIS — F0281 Dementia in other diseases classified elsewhere with behavioral disturbance: Secondary | ICD-10-CM | POA: Diagnosis not present

## 2021-03-04 DIAGNOSIS — E7800 Pure hypercholesterolemia, unspecified: Secondary | ICD-10-CM | POA: Diagnosis not present

## 2021-04-03 DIAGNOSIS — F419 Anxiety disorder, unspecified: Secondary | ICD-10-CM | POA: Diagnosis not present

## 2021-04-03 DIAGNOSIS — F0281 Dementia in other diseases classified elsewhere with behavioral disturbance: Secondary | ICD-10-CM | POA: Diagnosis not present

## 2021-04-03 DIAGNOSIS — F324 Major depressive disorder, single episode, in partial remission: Secondary | ICD-10-CM | POA: Diagnosis not present

## 2021-04-03 DIAGNOSIS — Z9181 History of falling: Secondary | ICD-10-CM | POA: Diagnosis not present

## 2021-04-03 DIAGNOSIS — M069 Rheumatoid arthritis, unspecified: Secondary | ICD-10-CM | POA: Diagnosis not present

## 2021-04-03 DIAGNOSIS — E7800 Pure hypercholesterolemia, unspecified: Secondary | ICD-10-CM | POA: Diagnosis not present

## 2021-04-03 DIAGNOSIS — D72829 Elevated white blood cell count, unspecified: Secondary | ICD-10-CM | POA: Diagnosis not present

## 2021-04-03 DIAGNOSIS — M1711 Unilateral primary osteoarthritis, right knee: Secondary | ICD-10-CM | POA: Diagnosis not present

## 2021-04-03 DIAGNOSIS — Z87440 Personal history of urinary (tract) infections: Secondary | ICD-10-CM | POA: Diagnosis not present

## 2021-04-03 DIAGNOSIS — I10 Essential (primary) hypertension: Secondary | ICD-10-CM | POA: Diagnosis not present

## 2021-04-03 DIAGNOSIS — I739 Peripheral vascular disease, unspecified: Secondary | ICD-10-CM | POA: Diagnosis not present

## 2021-04-03 DIAGNOSIS — L409 Psoriasis, unspecified: Secondary | ICD-10-CM | POA: Diagnosis not present

## 2021-04-03 DIAGNOSIS — M47814 Spondylosis without myelopathy or radiculopathy, thoracic region: Secondary | ICD-10-CM | POA: Diagnosis not present

## 2021-04-03 DIAGNOSIS — D519 Vitamin B12 deficiency anemia, unspecified: Secondary | ICD-10-CM | POA: Diagnosis not present

## 2021-04-03 DIAGNOSIS — M859 Disorder of bone density and structure, unspecified: Secondary | ICD-10-CM | POA: Diagnosis not present

## 2021-04-03 DIAGNOSIS — J439 Emphysema, unspecified: Secondary | ICD-10-CM | POA: Diagnosis not present

## 2021-04-03 DIAGNOSIS — Z96659 Presence of unspecified artificial knee joint: Secondary | ICD-10-CM | POA: Diagnosis not present

## 2021-04-03 DIAGNOSIS — K5909 Other constipation: Secondary | ICD-10-CM | POA: Diagnosis not present

## 2021-04-03 DIAGNOSIS — G309 Alzheimer's disease, unspecified: Secondary | ICD-10-CM | POA: Diagnosis not present

## 2021-05-03 DIAGNOSIS — G309 Alzheimer's disease, unspecified: Secondary | ICD-10-CM | POA: Diagnosis not present

## 2021-05-03 DIAGNOSIS — M1711 Unilateral primary osteoarthritis, right knee: Secondary | ICD-10-CM | POA: Diagnosis not present

## 2021-05-03 DIAGNOSIS — I10 Essential (primary) hypertension: Secondary | ICD-10-CM | POA: Diagnosis not present

## 2021-05-03 DIAGNOSIS — I739 Peripheral vascular disease, unspecified: Secondary | ICD-10-CM | POA: Diagnosis not present

## 2021-05-03 DIAGNOSIS — Z9181 History of falling: Secondary | ICD-10-CM | POA: Diagnosis not present

## 2021-05-03 DIAGNOSIS — K5909 Other constipation: Secondary | ICD-10-CM | POA: Diagnosis not present

## 2021-05-03 DIAGNOSIS — Z87440 Personal history of urinary (tract) infections: Secondary | ICD-10-CM | POA: Diagnosis not present

## 2021-05-03 DIAGNOSIS — J439 Emphysema, unspecified: Secondary | ICD-10-CM | POA: Diagnosis not present

## 2021-05-03 DIAGNOSIS — D72829 Elevated white blood cell count, unspecified: Secondary | ICD-10-CM | POA: Diagnosis not present

## 2021-05-03 DIAGNOSIS — M47814 Spondylosis without myelopathy or radiculopathy, thoracic region: Secondary | ICD-10-CM | POA: Diagnosis not present

## 2021-05-03 DIAGNOSIS — F419 Anxiety disorder, unspecified: Secondary | ICD-10-CM | POA: Diagnosis not present

## 2021-05-03 DIAGNOSIS — D519 Vitamin B12 deficiency anemia, unspecified: Secondary | ICD-10-CM | POA: Diagnosis not present

## 2021-05-03 DIAGNOSIS — Z96659 Presence of unspecified artificial knee joint: Secondary | ICD-10-CM | POA: Diagnosis not present

## 2021-05-03 DIAGNOSIS — M069 Rheumatoid arthritis, unspecified: Secondary | ICD-10-CM | POA: Diagnosis not present

## 2021-05-03 DIAGNOSIS — F324 Major depressive disorder, single episode, in partial remission: Secondary | ICD-10-CM | POA: Diagnosis not present

## 2021-05-03 DIAGNOSIS — L409 Psoriasis, unspecified: Secondary | ICD-10-CM | POA: Diagnosis not present

## 2021-05-03 DIAGNOSIS — M859 Disorder of bone density and structure, unspecified: Secondary | ICD-10-CM | POA: Diagnosis not present

## 2021-05-03 DIAGNOSIS — E7800 Pure hypercholesterolemia, unspecified: Secondary | ICD-10-CM | POA: Diagnosis not present

## 2021-05-03 DIAGNOSIS — F0281 Dementia in other diseases classified elsewhere with behavioral disturbance: Secondary | ICD-10-CM | POA: Diagnosis not present

## 2021-06-02 DIAGNOSIS — J439 Emphysema, unspecified: Secondary | ICD-10-CM | POA: Diagnosis not present

## 2021-06-02 DIAGNOSIS — K5909 Other constipation: Secondary | ICD-10-CM | POA: Diagnosis not present

## 2021-06-02 DIAGNOSIS — M859 Disorder of bone density and structure, unspecified: Secondary | ICD-10-CM | POA: Diagnosis not present

## 2021-06-02 DIAGNOSIS — M1711 Unilateral primary osteoarthritis, right knee: Secondary | ICD-10-CM | POA: Diagnosis not present

## 2021-06-02 DIAGNOSIS — G309 Alzheimer's disease, unspecified: Secondary | ICD-10-CM | POA: Diagnosis not present

## 2021-06-02 DIAGNOSIS — D519 Vitamin B12 deficiency anemia, unspecified: Secondary | ICD-10-CM | POA: Diagnosis not present

## 2021-06-02 DIAGNOSIS — F419 Anxiety disorder, unspecified: Secondary | ICD-10-CM | POA: Diagnosis not present

## 2021-06-02 DIAGNOSIS — I739 Peripheral vascular disease, unspecified: Secondary | ICD-10-CM | POA: Diagnosis not present

## 2021-06-02 DIAGNOSIS — Z96659 Presence of unspecified artificial knee joint: Secondary | ICD-10-CM | POA: Diagnosis not present

## 2021-06-02 DIAGNOSIS — I10 Essential (primary) hypertension: Secondary | ICD-10-CM | POA: Diagnosis not present

## 2021-06-02 DIAGNOSIS — M47814 Spondylosis without myelopathy or radiculopathy, thoracic region: Secondary | ICD-10-CM | POA: Diagnosis not present

## 2021-06-02 DIAGNOSIS — D72829 Elevated white blood cell count, unspecified: Secondary | ICD-10-CM | POA: Diagnosis not present

## 2021-06-02 DIAGNOSIS — Z9181 History of falling: Secondary | ICD-10-CM | POA: Diagnosis not present

## 2021-06-02 DIAGNOSIS — F324 Major depressive disorder, single episode, in partial remission: Secondary | ICD-10-CM | POA: Diagnosis not present

## 2021-06-02 DIAGNOSIS — M069 Rheumatoid arthritis, unspecified: Secondary | ICD-10-CM | POA: Diagnosis not present

## 2021-06-02 DIAGNOSIS — F0281 Dementia in other diseases classified elsewhere with behavioral disturbance: Secondary | ICD-10-CM | POA: Diagnosis not present

## 2021-06-02 DIAGNOSIS — L409 Psoriasis, unspecified: Secondary | ICD-10-CM | POA: Diagnosis not present

## 2021-06-02 DIAGNOSIS — E7800 Pure hypercholesterolemia, unspecified: Secondary | ICD-10-CM | POA: Diagnosis not present

## 2021-06-02 DIAGNOSIS — Z87440 Personal history of urinary (tract) infections: Secondary | ICD-10-CM | POA: Diagnosis not present

## 2021-06-10 DIAGNOSIS — Z0000 Encounter for general adult medical examination without abnormal findings: Secondary | ICD-10-CM | POA: Diagnosis not present

## 2021-06-10 DIAGNOSIS — Z23 Encounter for immunization: Secondary | ICD-10-CM | POA: Diagnosis not present

## 2021-06-10 DIAGNOSIS — I10 Essential (primary) hypertension: Secondary | ICD-10-CM | POA: Diagnosis not present

## 2021-06-10 DIAGNOSIS — F324 Major depressive disorder, single episode, in partial remission: Secondary | ICD-10-CM | POA: Diagnosis not present

## 2021-06-10 DIAGNOSIS — J439 Emphysema, unspecified: Secondary | ICD-10-CM | POA: Diagnosis not present

## 2021-06-10 DIAGNOSIS — R6889 Other general symptoms and signs: Secondary | ICD-10-CM | POA: Diagnosis not present

## 2021-06-10 DIAGNOSIS — E2839 Other primary ovarian failure: Secondary | ICD-10-CM | POA: Diagnosis not present

## 2021-06-10 DIAGNOSIS — E785 Hyperlipidemia, unspecified: Secondary | ICD-10-CM | POA: Diagnosis not present

## 2021-06-10 DIAGNOSIS — L409 Psoriasis, unspecified: Secondary | ICD-10-CM | POA: Diagnosis not present

## 2021-06-10 DIAGNOSIS — Z79899 Other long term (current) drug therapy: Secondary | ICD-10-CM | POA: Diagnosis not present

## 2021-06-10 DIAGNOSIS — J4520 Mild intermittent asthma, uncomplicated: Secondary | ICD-10-CM | POA: Diagnosis not present

## 2021-06-16 ENCOUNTER — Other Ambulatory Visit: Payer: Self-pay | Admitting: Family Medicine

## 2021-06-16 DIAGNOSIS — L821 Other seborrheic keratosis: Secondary | ICD-10-CM | POA: Diagnosis not present

## 2021-06-16 DIAGNOSIS — L400 Psoriasis vulgaris: Secondary | ICD-10-CM | POA: Diagnosis not present

## 2021-06-16 DIAGNOSIS — L814 Other melanin hyperpigmentation: Secondary | ICD-10-CM | POA: Diagnosis not present

## 2021-06-16 DIAGNOSIS — E2839 Other primary ovarian failure: Secondary | ICD-10-CM

## 2021-06-16 DIAGNOSIS — Z79899 Other long term (current) drug therapy: Secondary | ICD-10-CM | POA: Diagnosis not present

## 2021-09-12 DIAGNOSIS — Z8616 Personal history of COVID-19: Secondary | ICD-10-CM

## 2021-09-12 HISTORY — DX: Personal history of COVID-19: Z86.16

## 2021-10-18 DIAGNOSIS — D485 Neoplasm of uncertain behavior of skin: Secondary | ICD-10-CM | POA: Diagnosis not present

## 2021-10-18 DIAGNOSIS — L400 Psoriasis vulgaris: Secondary | ICD-10-CM | POA: Diagnosis not present

## 2021-10-18 DIAGNOSIS — Z79899 Other long term (current) drug therapy: Secondary | ICD-10-CM | POA: Diagnosis not present

## 2021-10-18 DIAGNOSIS — L570 Actinic keratosis: Secondary | ICD-10-CM | POA: Diagnosis not present

## 2021-10-18 DIAGNOSIS — L814 Other melanin hyperpigmentation: Secondary | ICD-10-CM | POA: Diagnosis not present

## 2021-11-03 DIAGNOSIS — J441 Chronic obstructive pulmonary disease with (acute) exacerbation: Secondary | ICD-10-CM | POA: Diagnosis not present

## 2021-12-19 DIAGNOSIS — Z20822 Contact with and (suspected) exposure to covid-19: Secondary | ICD-10-CM | POA: Diagnosis not present

## 2021-12-21 ENCOUNTER — Other Ambulatory Visit: Payer: BLUE CROSS/BLUE SHIELD

## 2021-12-28 DIAGNOSIS — R1013 Epigastric pain: Secondary | ICD-10-CM | POA: Diagnosis not present

## 2022-01-10 DIAGNOSIS — Z20822 Contact with and (suspected) exposure to covid-19: Secondary | ICD-10-CM | POA: Diagnosis not present

## 2022-02-16 DIAGNOSIS — L400 Psoriasis vulgaris: Secondary | ICD-10-CM | POA: Diagnosis not present

## 2022-02-16 DIAGNOSIS — Z79899 Other long term (current) drug therapy: Secondary | ICD-10-CM | POA: Diagnosis not present

## 2022-02-16 DIAGNOSIS — L304 Erythema intertrigo: Secondary | ICD-10-CM | POA: Diagnosis not present

## 2022-06-22 DIAGNOSIS — Z79899 Other long term (current) drug therapy: Secondary | ICD-10-CM | POA: Diagnosis not present

## 2022-06-22 DIAGNOSIS — L400 Psoriasis vulgaris: Secondary | ICD-10-CM | POA: Diagnosis not present

## 2022-07-05 DIAGNOSIS — F324 Major depressive disorder, single episode, in partial remission: Secondary | ICD-10-CM | POA: Diagnosis not present

## 2022-07-05 DIAGNOSIS — I10 Essential (primary) hypertension: Secondary | ICD-10-CM | POA: Diagnosis not present

## 2022-07-05 DIAGNOSIS — Z0000 Encounter for general adult medical examination without abnormal findings: Secondary | ICD-10-CM | POA: Diagnosis not present

## 2022-07-05 DIAGNOSIS — W19XXXA Unspecified fall, initial encounter: Secondary | ICD-10-CM | POA: Diagnosis not present

## 2022-07-05 DIAGNOSIS — Z23 Encounter for immunization: Secondary | ICD-10-CM | POA: Diagnosis not present

## 2022-07-05 DIAGNOSIS — E785 Hyperlipidemia, unspecified: Secondary | ICD-10-CM | POA: Diagnosis not present

## 2022-07-05 DIAGNOSIS — L409 Psoriasis, unspecified: Secondary | ICD-10-CM | POA: Diagnosis not present

## 2022-07-05 DIAGNOSIS — J439 Emphysema, unspecified: Secondary | ICD-10-CM | POA: Diagnosis not present

## 2022-07-05 DIAGNOSIS — J4520 Mild intermittent asthma, uncomplicated: Secondary | ICD-10-CM | POA: Diagnosis not present

## 2022-07-05 DIAGNOSIS — Z79899 Other long term (current) drug therapy: Secondary | ICD-10-CM | POA: Diagnosis not present

## 2022-07-05 DIAGNOSIS — F0390 Unspecified dementia without behavioral disturbance: Secondary | ICD-10-CM | POA: Diagnosis not present

## 2022-07-06 ENCOUNTER — Inpatient Hospital Stay (HOSPITAL_COMMUNITY)
Admission: EM | Admit: 2022-07-06 | Discharge: 2022-07-12 | DRG: 853 | Disposition: A | Discharge status: Skilled Nursing Facility | Payer: Medicare Other | Attending: Internal Medicine | Admitting: Internal Medicine

## 2022-07-06 ENCOUNTER — Emergency Department (HOSPITAL_COMMUNITY): Payer: Medicare Other

## 2022-07-06 ENCOUNTER — Encounter (HOSPITAL_COMMUNITY): Payer: Self-pay | Admitting: Emergency Medicine

## 2022-07-06 DIAGNOSIS — R1900 Intra-abdominal and pelvic swelling, mass and lump, unspecified site: Secondary | ICD-10-CM | POA: Diagnosis present

## 2022-07-06 DIAGNOSIS — E538 Deficiency of other specified B group vitamins: Secondary | ICD-10-CM | POA: Diagnosis present

## 2022-07-06 DIAGNOSIS — Z79899 Other long term (current) drug therapy: Secondary | ICD-10-CM

## 2022-07-06 DIAGNOSIS — R531 Weakness: Secondary | ICD-10-CM | POA: Diagnosis not present

## 2022-07-06 DIAGNOSIS — F32A Depression, unspecified: Secondary | ICD-10-CM | POA: Diagnosis present

## 2022-07-06 DIAGNOSIS — R404 Transient alteration of awareness: Secondary | ICD-10-CM | POA: Diagnosis not present

## 2022-07-06 DIAGNOSIS — N132 Hydronephrosis with renal and ureteral calculous obstruction: Secondary | ICD-10-CM | POA: Diagnosis not present

## 2022-07-06 DIAGNOSIS — R41841 Cognitive communication deficit: Secondary | ICD-10-CM | POA: Diagnosis not present

## 2022-07-06 DIAGNOSIS — F02A0 Dementia in other diseases classified elsewhere, mild, without behavioral disturbance, psychotic disturbance, mood disturbance, and anxiety: Secondary | ICD-10-CM | POA: Diagnosis not present

## 2022-07-06 DIAGNOSIS — F419 Anxiety disorder, unspecified: Secondary | ICD-10-CM | POA: Diagnosis present

## 2022-07-06 DIAGNOSIS — N179 Acute kidney failure, unspecified: Secondary | ICD-10-CM | POA: Diagnosis present

## 2022-07-06 DIAGNOSIS — M25561 Pain in right knee: Secondary | ICD-10-CM | POA: Diagnosis not present

## 2022-07-06 DIAGNOSIS — R4182 Altered mental status, unspecified: Principal | ICD-10-CM

## 2022-07-06 DIAGNOSIS — Z1152 Encounter for screening for COVID-19: Secondary | ICD-10-CM | POA: Diagnosis not present

## 2022-07-06 DIAGNOSIS — N1831 Chronic kidney disease, stage 3a: Secondary | ICD-10-CM | POA: Diagnosis not present

## 2022-07-06 DIAGNOSIS — Z7982 Long term (current) use of aspirin: Secondary | ICD-10-CM | POA: Diagnosis not present

## 2022-07-06 DIAGNOSIS — Z6832 Body mass index (BMI) 32.0-32.9, adult: Secondary | ICD-10-CM

## 2022-07-06 DIAGNOSIS — J449 Chronic obstructive pulmonary disease, unspecified: Secondary | ICD-10-CM | POA: Diagnosis not present

## 2022-07-06 DIAGNOSIS — N1330 Unspecified hydronephrosis: Secondary | ICD-10-CM | POA: Diagnosis not present

## 2022-07-06 DIAGNOSIS — I129 Hypertensive chronic kidney disease with stage 1 through stage 4 chronic kidney disease, or unspecified chronic kidney disease: Secondary | ICD-10-CM | POA: Diagnosis not present

## 2022-07-06 DIAGNOSIS — L409 Psoriasis, unspecified: Secondary | ICD-10-CM | POA: Diagnosis present

## 2022-07-06 DIAGNOSIS — E559 Vitamin D deficiency, unspecified: Secondary | ICD-10-CM | POA: Diagnosis present

## 2022-07-06 DIAGNOSIS — D259 Leiomyoma of uterus, unspecified: Secondary | ICD-10-CM | POA: Diagnosis not present

## 2022-07-06 DIAGNOSIS — N201 Calculus of ureter: Secondary | ICD-10-CM | POA: Diagnosis not present

## 2022-07-06 DIAGNOSIS — R519 Headache, unspecified: Secondary | ICD-10-CM | POA: Diagnosis not present

## 2022-07-06 DIAGNOSIS — G309 Alzheimer's disease, unspecified: Secondary | ICD-10-CM | POA: Diagnosis not present

## 2022-07-06 DIAGNOSIS — F05 Delirium due to known physiological condition: Secondary | ICD-10-CM | POA: Diagnosis present

## 2022-07-06 DIAGNOSIS — N136 Pyonephrosis: Secondary | ICD-10-CM | POA: Diagnosis not present

## 2022-07-06 DIAGNOSIS — Z79631 Long term (current) use of antimetabolite agent: Secondary | ICD-10-CM

## 2022-07-06 DIAGNOSIS — Z9181 History of falling: Secondary | ICD-10-CM

## 2022-07-06 DIAGNOSIS — Z87440 Personal history of urinary (tract) infections: Secondary | ICD-10-CM | POA: Diagnosis not present

## 2022-07-06 DIAGNOSIS — F03A0 Unspecified dementia, mild, without behavioral disturbance, psychotic disturbance, mood disturbance, and anxiety: Secondary | ICD-10-CM | POA: Diagnosis present

## 2022-07-06 DIAGNOSIS — A4181 Sepsis due to Enterococcus: Secondary | ICD-10-CM | POA: Diagnosis not present

## 2022-07-06 DIAGNOSIS — I10 Essential (primary) hypertension: Secondary | ICD-10-CM | POA: Diagnosis present

## 2022-07-06 DIAGNOSIS — M6259 Muscle wasting and atrophy, not elsewhere classified, multiple sites: Secondary | ICD-10-CM | POA: Diagnosis not present

## 2022-07-06 DIAGNOSIS — Z7401 Bed confinement status: Secondary | ICD-10-CM | POA: Diagnosis not present

## 2022-07-06 DIAGNOSIS — R54 Age-related physical debility: Secondary | ICD-10-CM | POA: Diagnosis present

## 2022-07-06 DIAGNOSIS — Z87442 Personal history of urinary calculi: Secondary | ICD-10-CM

## 2022-07-06 DIAGNOSIS — M1990 Unspecified osteoarthritis, unspecified site: Secondary | ICD-10-CM | POA: Diagnosis not present

## 2022-07-06 DIAGNOSIS — N189 Chronic kidney disease, unspecified: Secondary | ICD-10-CM | POA: Diagnosis not present

## 2022-07-06 DIAGNOSIS — N323 Diverticulum of bladder: Secondary | ICD-10-CM | POA: Diagnosis not present

## 2022-07-06 DIAGNOSIS — Z741 Need for assistance with personal care: Secondary | ICD-10-CM | POA: Diagnosis not present

## 2022-07-06 DIAGNOSIS — G9341 Metabolic encephalopathy: Secondary | ICD-10-CM | POA: Diagnosis not present

## 2022-07-06 DIAGNOSIS — N390 Urinary tract infection, site not specified: Secondary | ICD-10-CM | POA: Diagnosis not present

## 2022-07-06 DIAGNOSIS — N281 Cyst of kidney, acquired: Secondary | ICD-10-CM | POA: Diagnosis not present

## 2022-07-06 DIAGNOSIS — M069 Rheumatoid arthritis, unspecified: Secondary | ICD-10-CM | POA: Diagnosis present

## 2022-07-06 DIAGNOSIS — R2681 Unsteadiness on feet: Secondary | ICD-10-CM | POA: Diagnosis not present

## 2022-07-06 DIAGNOSIS — R7989 Other specified abnormal findings of blood chemistry: Secondary | ICD-10-CM | POA: Diagnosis not present

## 2022-07-06 DIAGNOSIS — M6281 Muscle weakness (generalized): Secondary | ICD-10-CM | POA: Diagnosis not present

## 2022-07-06 DIAGNOSIS — R9341 Abnormal radiologic findings on diagnostic imaging of renal pelvis, ureter, or bladder: Secondary | ICD-10-CM | POA: Diagnosis not present

## 2022-07-06 DIAGNOSIS — E669 Obesity, unspecified: Secondary | ICD-10-CM | POA: Diagnosis present

## 2022-07-06 DIAGNOSIS — Z043 Encounter for examination and observation following other accident: Secondary | ICD-10-CM | POA: Diagnosis not present

## 2022-07-06 DIAGNOSIS — Z803 Family history of malignant neoplasm of breast: Secondary | ICD-10-CM

## 2022-07-06 LAB — CBC WITH DIFFERENTIAL/PLATELET
Abs Immature Granulocytes: 0.15 10*3/uL — ABNORMAL HIGH (ref 0.00–0.07)
Basophils Absolute: 0.1 10*3/uL (ref 0.0–0.1)
Basophils Relative: 0 %
Eosinophils Absolute: 0.1 10*3/uL (ref 0.0–0.5)
Eosinophils Relative: 0 %
HCT: 39.3 % (ref 36.0–46.0)
Hemoglobin: 12.4 g/dL (ref 12.0–15.0)
Immature Granulocytes: 1 %
Lymphocytes Relative: 9 %
Lymphs Abs: 1.8 10*3/uL (ref 0.7–4.0)
MCH: 32 pg (ref 26.0–34.0)
MCHC: 31.6 g/dL (ref 30.0–36.0)
MCV: 101.3 fL — ABNORMAL HIGH (ref 80.0–100.0)
Monocytes Absolute: 1.4 10*3/uL — ABNORMAL HIGH (ref 0.1–1.0)
Monocytes Relative: 7 %
Neutro Abs: 17 10*3/uL — ABNORMAL HIGH (ref 1.7–7.7)
Neutrophils Relative %: 83 %
Platelets: 510 10*3/uL — ABNORMAL HIGH (ref 150–400)
RBC: 3.88 MIL/uL (ref 3.87–5.11)
RDW: 15.4 % (ref 11.5–15.5)
WBC: 20.5 10*3/uL — ABNORMAL HIGH (ref 4.0–10.5)
nRBC: 0 % (ref 0.0–0.2)

## 2022-07-06 LAB — COMPREHENSIVE METABOLIC PANEL
ALT: 62 U/L — ABNORMAL HIGH (ref 0–44)
AST: 33 U/L (ref 15–41)
Albumin: 2.3 g/dL — ABNORMAL LOW (ref 3.5–5.0)
Alkaline Phosphatase: 130 U/L — ABNORMAL HIGH (ref 38–126)
Anion gap: 14 (ref 5–15)
BUN: 24 mg/dL — ABNORMAL HIGH (ref 8–23)
CO2: 24 mmol/L (ref 22–32)
Calcium: 8.9 mg/dL (ref 8.9–10.3)
Chloride: 100 mmol/L (ref 98–111)
Creatinine, Ser: 1.31 mg/dL — ABNORMAL HIGH (ref 0.44–1.00)
GFR, Estimated: 41 mL/min — ABNORMAL LOW (ref >60)
Glucose, Bld: 154 mg/dL — ABNORMAL HIGH (ref 70–99)
Potassium: 4.1 mmol/L (ref 3.5–5.1)
Sodium: 138 mmol/L (ref 135–145)
Total Bilirubin: 1.2 mg/dL (ref 0.3–1.2)
Total Protein: 6.5 g/dL (ref 6.5–8.1)

## 2022-07-06 LAB — CK: Total CK: 17 U/L — ABNORMAL LOW (ref 38–234)

## 2022-07-06 MED ORDER — SODIUM CHLORIDE 0.9 % IV BOLUS
500.0000 mL | Freq: Once | INTRAVENOUS | Status: AC
  Administered 2022-07-07: 500 mL via INTRAVENOUS

## 2022-07-06 NOTE — ED Provider Notes (Incomplete)
[facility] EMERGENCY DEPARTMENT Provider Note   CSN: [id number] Arrival date & time: 07/06/22  1054     History {Add pertinent medical, surgical, social history, OB history to HPI:1} Chief Complaint  Patient presents with  . Fall    Danielle Dixon is a 81 y.o. female.  81 year old female presents from home with a friend/caretaker, lives in independent living facility. Reports fall Sunday (about a week and half ago), laid on the ground all night, EMS came in the morning and patient refused transport. Patient was unable to ambulate after the full well, progressive cognitive decline, wears diapers but isn't changed regularly and has frequent UTIs. Friend returned on Sunday, concerned pt is not ambulating well and is confused/not herself. Also occasional cough (productive with white sputum). Went to PCP earlier this week who was concerned about elevated ammonia level.  1 episode of vomiting while in the waiting room tonight.  Friend at bedside with labs from yesterday's visit- ammonia 192*       Home Medications Prior to Admission medications   Medication Sig Start Date End Date Taking? Authorizing Provider  albuterol (VENTOLIN HFA) 108 (90 Base) MCG/ACT inhaler Inhale 2 puffs into the lungs 4 (four) times daily. (0900, 1200, 1600, & 2000)    [provider]  ALPRAZolam (XANAX) 0.25 MG tablet Take 0.25 mg by mouth daily as needed for anxiety.    [provider]  aspirin 81 MG chewable tablet Chew 81 mg by mouth daily. (0900)    [provider]  atenolol (TENORMIN) 25 MG tablet Take 25 mg by mouth daily. (0900)    [provider]  cholecalciferol (VITAMIN D) 1000 UNITS tablet Take 1 tablet (1,000 Units total) by mouth daily. Patient taking differently: Take 1,000 Units by mouth daily. (0900) 06/25/13   Heath Lark, MD  clobetasol cream (TEMOVATE) 3.54 % Apply 1 application topically 2 (two) times daily as needed (rash).    [provider]  donepezil (ARICEPT) 10 MG tablet Take 10 mg by mouth at bedtime. (2100)    [provider]  Ensure (ENSURE) Take 237 mLs by mouth 3 (three) times daily. (0800, 1200, & 1700)    [provider]  folic acid (FOLVITE) 1 MG tablet Take 1 mg by mouth daily. (0900)    [provider]  Glycerin-Hypromellose-PEG 400 (DRY EYE RELIEF DROPS) 0.2-0.2-1 % SOLN Place 1 drop into both eyes 4 (four) times daily as needed (dry eyes).    [provider]  guaifenesin (ROBITUSSIN) 100 MG/5ML syrup Take 200 mg by mouth 4 (four) times daily as needed for cough.    [provider]  HYDROcodone-acetaminophen (NORCO) 5-325 MG tablet Take 1 tablet by mouth every 4 (four) hours as needed for moderate pain. 09/18/19   Lucas Mallow, MD  methotrexate (RHEUMATREX) 2.5 MG tablet Take 17.5 mg by mouth every Wednesday. (0900)    [provider]  PARoxetine (PAXIL-CR) 25 MG 24 hr tablet Take 25 mg by mouth daily. (0600)    [provider]  polyethylene glycol (MIRALAX / GLYCOLAX) 17 g packet Take 17 g by mouth daily as needed for moderate constipation.    [provider]  simvastatin (ZOCOR) 10 MG tablet Take 10 mg by mouth at bedtime. (2100)    [provider]  triamcinolone cream (KENALOG) 0.1 % Apply 1 application topically 2 (two) times daily as needed (psoriasis/rash).     [provider]  vitamin B-12 (CYANOCOBALAMIN)  500 MCG tablet Take 500 mcg by mouth daily. (0900)    [provider]  zafirlukast (ACCOLATE) 20 MG tablet Take 20 mg by mouth 2 (two) times daily. (0900 & 2100)    [provider]      Allergies    Patient has no known allergies.    Review of Systems   Review of Systems Level 5 caveat for poor historian Physical Exam Updated Vital Signs BP (!) 140/61   Pulse 70   Temp 98.1 F (36.7 C) (Oral)   Resp (!) 21   SpO2 99%  Physical Exam Vitals and nursing note reviewed.   Constitutional:      General: She is not in acute distress.    Appearance: She is well-developed. She is not diaphoretic.  HENT:     Head: Normocephalic and atraumatic.  Cardiovascular:     Rate and Rhythm: Normal rate and regular rhythm.     Pulses: Normal pulses.     Heart sounds: Normal heart sounds.  Pulmonary:     Effort: Pulmonary effort is normal.     Breath sounds: Normal breath sounds.     Comments: Very mild exp wheezing Abdominal:     Palpations: Abdomen is soft.     Tenderness: There is abdominal tenderness in the periumbilical area.     Hernia: A hernia is present. Hernia is present in the umbilical area.  Musculoskeletal:     Right lower leg: No edema.     Left lower leg: No edema.  Skin:    General: Skin is warm and dry.     Findings: No erythema or rash.  Neurological:     Mental Status: She is alert.  Psychiatric:        Behavior: Behavior normal.     Comments: Alert to person, knows name of caretaker     ED Results / Procedures / Treatments   Labs (all labs ordered are listed, but only abnormal results are displayed) Labs Reviewed  CBC WITH DIFFERENTIAL/PLATELET - Abnormal; Notable for the following components:      Result Value   WBC 20.5 (*)    MCV 101.3 (*)    Platelets 510 (*)    Neutro Abs 17.0 (*)    Monocytes Absolute 1.4 (*)    Abs Immature Granulocytes 0.15 (*)    All other components within normal limits  COMPREHENSIVE METABOLIC PANEL - Abnormal; Notable for the following components:   Glucose, Bld 154 (*)    BUN 24 (*)    Creatinine, Ser 1.31 (*)    Albumin 2.3 (*)    ALT 62 (*)    Alkaline Phosphatase 130 (*)    GFR, Estimated 41 (*)    All other components within normal limits  CK - Abnormal; Notable for the following components:   Total CK 17 (*)    All other components within normal limits  CULTURE, BLOOD (ROUTINE X 2)  CULTURE, BLOOD (ROUTINE X 2)  RESP PANEL BY RT-PCR (FLU A&B, COVID) ARPGX2  URINE CULTURE  URINALYSIS,  ROUTINE W REFLEX MICROSCOPIC  AMMONIA  PROTIME-INR  LACTIC ACID, PLASMA  LACTIC ACID, PLASMA  CBG MONITORING, ED    EKG None  Radiology CT Head Wo Contrast  Result Date: 07/06/2022 CLINICAL DATA:  Fall, knee pain EXAM: CT HEAD WITHOUT CONTRAST TECHNIQUE: Contiguous axial images were obtained from the base of the skull through the vertex without intravenous contrast. RADIATION DOSE REDUCTION: This exam was performed according to the departmental dose-optimization  program which includes automated exposure control, adjustment of the mA and/or kV according to patient size and/or use of iterative reconstruction technique. COMPARISON:  None Available. FINDINGS: Brain: No acute intracranial hemorrhage. No focal mass lesion. No CT evidence of acute infarction. No midline shift or mass effect. No hydrocephalus. Basilar cisterns are patent. There are periventricular and subcortical white matter hypodensities. Generalized cortical atrophy. Vascular: No hyperdense vessel or unexpected calcification. Skull: Normal. Negative for fracture or focal lesion. Sinuses/Orbits: Paranasal sinuses and mastoid air cells are clear. Orbits are clear. Other: None. IMPRESSION: No acute intracranial findings. Atrophy and white matter microvascular disease. Electronically Signed   By: Suzy Bouchard M.D.   On: 07/06/2022 13:25   DG Knee Complete 4 Views Right  Result Date: 07/06/2022 CLINICAL DATA:  Fall 1 week ago, right knee pain EXAM: RIGHT KNEE - COMPLETE 4+ VIEW COMPARISON:  07/12/2019 FINDINGS: Osteopenia. No fracture or dislocation of the right knee. 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 No knee joint effusion. Soft tissues unremarkable. IMPRESSION: 1. Osteopenia. No fracture or dislocation of the right knee. 2. 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 Electronically Signed   By: Delanna Ahmadi M.D.   On: 07/06/2022 13:04   DG Chest 2  View  Result Date: 07/06/2022 CLINICAL DATA:  Weakness, fall 1 week ago altered mental status EXAM: CHEST - 2 VIEW COMPARISON:  07/12/2019 FINDINGS: The heart size and mediastinal contours are within normal limits. Probable trace pleural effusions. Disc degenerative disease of the thoracic spine. IMPRESSION: Probable trace pleural effusions. No focal airspace opacity. Electronically Signed   By: Delanna Ahmadi M.D.   On: 07/06/2022 13:02    Procedures Procedures  {Document cardiac monitor, telemetry assessment procedure when appropriate:1}  Medications Ordered in ED Medications  sodium chloride 0.9 % bolus 500 mL (has no administration in time range)    ED Course/ Medical Decision Making/ A&P                           Medical Decision Making Amount and/or Complexity of Data Reviewed Labs: ordered. Radiology: ordered. ECG/medicine tests: ordered.   This patient presents to the ED for concern of change in mental status, this involves an extensive number of treatment options, and is a complaint that carries with it a high risk of complications and morbidity.  The differential diagnosis includes ***   Co morbidities that complicate the patient evaluation  Dementia, alzheimers, RA, frequent UTI.    Additional history obtained:  Additional history obtained from caregiver at bedside who contributes to history as above External records from outside source obtained and reviewed including ***   Lab Tests:  I Ordered, and personally interpreted labs.  The pertinent results include:  ***   Imaging Studies ordered:  I ordered imaging studies including ***  I independently visualized and interpreted imaging which showed *** I agree with the radiologist interpretation   Cardiac Monitoring: / EKG:  The patient was maintained on a cardiac monitor.  I personally viewed and interpreted the cardiac monitored which showed an underlying rhythm of: sinus rhythm rate 73   Consultations  Obtained:  I requested consultation with the ***,  and discussed lab and imaging findings as well as pertinent plan - they recommend: ***   Problem List / ED Course / Critical interventions / Medication management  *** I ordered medication including ***  for ***  Reevaluation of the patient after these medicines showed that  the patient {resolved/improved/worsened:23923::"improved"} I have reviewed the patients home medicines and have made adjustments as needed   Social Determinants of Health:  ***   Test / Admission - Considered:  ***   {Document critical care time when appropriate:1} {Document review of labs and clinical decision tools ie heart score, Chads2Vasc2 etc:1}  {Document your independent review of radiology images, and any outside records:1} {Document your discussion with family members, caretakers, and with consultants:1} {Document social determinants of health affecting pt's care:1} {Document your decision making why or why not admission, treatments were needed:1} Final Clinical Impression(s) / ED Diagnoses Final diagnoses:  None    Rx / DC Orders ED Discharge Orders     None

## 2022-07-06 NOTE — ED Provider Triage Note (Signed)
Emergency Medicine Provider Triage Evaluation Note  Danielle Dixon , a 81 y.o. female  was evaluated in triage.  Pt complains of right knee pain and alteration in baseline mental status.  Patient was sent by primary care after evaluation this morning.  Patient reportedly fell approximate 1 week ago and was on the floor overnight.  EMS came and helped her to up and she declined transport to the hospital at that time.  She has been complaining of right-sided knee pain.  Primary care states the patient is not at her baseline mentally.  She is more sluggish than usual.  They sent her to the emergency department with concerns over possible subdural hemorrhage and possible rhabdomyolysis.  She is alert and oriented at this time.  Review of Systems  Positive: Decrease in baseline mental status, right knee pain Negative: Chest pain, shortness of breath, headache, abdominal pain  Physical Exam  BP 125/71 (BP Location: Left Arm)   Pulse 86   Temp 98.1 F (36.7 C) (Oral)   Resp 16   SpO2 96%  Gen:   Awake, no distress   Resp:  Normal effort  MSK:   Moves extremities without difficulty  Other:    Medical Decision Making  Medically screening exam initiated at 11:54 AM.  Appropriate orders placed.  Danielle Dixon was informed that the remainder of the evaluation will be completed by another provider, this initial triage assessment does not replace that evaluation, and the importance of remaining in the ED until their evaluation is complete.     Dorothyann Peng, PA-C 07/06/22 1158

## 2022-07-06 NOTE — ED Provider Notes (Signed)
[facility] EMERGENCY DEPARTMENT Provider Note   CSN: [id number] Arrival date & time: 07/06/22  1054     History {Add pertinent medical, surgical, social history, OB history to HPI:1} Chief Complaint  Patient presents with   Danielle Dixon is a 81 y.o. female.  81 year old female presents from home with a friend/caretaker, lives in independent living facility. Reports fall Sunday (about a week and half ago), laid on the ground all night, EMS came in the morning and patient refused transport. Patient was unable to ambulate after the full well, progressive cognitive decline, wears diapers but isn't changed regularly and has frequent UTIs. Friend returned on Sunday, concerned pt is not ambulating well and is confused/not herself. Also occasional cough (productive with white sputum). Went to PCP earlier this week who was concerned about elevated ammonia level.  1 episode of vomiting while in the waiting room tonight.  Friend at bedside with labs from yesterday's visit- ammonia 192*       Home Medications Prior to Admission medications   Medication Sig Start Date End Date Taking? Authorizing Provider  albuterol (VENTOLIN HFA) 108 (90 Base) MCG/ACT inhaler Inhale 2 puffs into the lungs 4 (four) times daily. (0900, 1200, 1600, & 2000)    [provider]  ALPRAZolam (XANAX) 0.25 MG tablet Take 0.25 mg by mouth daily as needed for anxiety.    [provider]  aspirin 81 MG chewable tablet Chew 81 mg by mouth daily. (0900)    [provider]  atenolol (TENORMIN) 25 MG tablet Take 25 mg by mouth daily. (0900)    [provider]  cholecalciferol (VITAMIN D) 1000 UNITS tablet Take 1 tablet (1,000 Units total) by mouth daily. Patient taking differently: Take 1,000 Units by mouth daily. (0900) 06/25/13   Heath Lark, MD  clobetasol cream (TEMOVATE) 0.01 % Apply 1 application topically 2 (two) times daily as needed (rash).    [provider]  donepezil (ARICEPT) 10 MG tablet Take 10 mg by mouth at bedtime. (2100)    [provider]  Ensure (ENSURE) Take 237 mLs by mouth 3 (three) times daily. (0800, 1200, & 1700)    [provider]  folic acid (FOLVITE) 1 MG tablet Take 1 mg by mouth daily. (0900)    [provider]  Glycerin-Hypromellose-PEG 400 (DRY EYE RELIEF DROPS) 0.2-0.2-1 % SOLN Place 1 drop into both eyes 4 (four) times daily as needed (dry eyes).    [provider]  guaifenesin (ROBITUSSIN) 100 MG/5ML syrup Take 200 mg by mouth 4 (four) times daily as needed for cough.    [provider]  HYDROcodone-acetaminophen (NORCO) 5-325 MG tablet Take 1 tablet by mouth every 4 (four) hours as needed for moderate pain. 09/18/19   Lucas Mallow, MD  methotrexate (RHEUMATREX) 2.5 MG tablet Take 17.5 mg by mouth every Wednesday. (0900)    [provider]  PARoxetine (PAXIL-CR) 25 MG 24 hr tablet Take 25 mg by mouth daily. (0600)    [provider]  polyethylene glycol (MIRALAX / GLYCOLAX) 17 g packet Take 17 g by mouth daily as needed for moderate constipation.    [provider]  simvastatin (ZOCOR) 10 MG tablet Take 10 mg by mouth at bedtime. (2100)    [provider]  triamcinolone cream (KENALOG) 0.1 % Apply 1 application topically 2 (two) times daily as needed (psoriasis/rash).     [provider]  vitamin B-12 (CYANOCOBALAMIN)  500 MCG tablet Take 500 mcg by mouth daily. (0900)    [provider]  zafirlukast (ACCOLATE) 20 MG tablet Take 20 mg by mouth 2 (two) times daily. (0900 & 2100)    [provider]      Allergies    Patient has no known allergies.    Review of Systems   Review of Systems Level 5 caveat for poor historian Physical Exam Updated Vital Signs BP 125/71 (BP Location: Left Arm)   Pulse 86   Temp 98.1 F (36.7 C) (Oral)   Resp 16   SpO2 96%  Physical Exam Vitals and nursing note  reviewed.  Constitutional:      General: She is not in acute distress.    Appearance: She is well-developed. She is not diaphoretic.  HENT:     Head: Normocephalic and atraumatic.  Cardiovascular:     Rate and Rhythm: Normal rate and regular rhythm.     Pulses: Normal pulses.     Heart sounds: Normal heart sounds.  Pulmonary:     Effort: Pulmonary effort is normal.     Breath sounds: Normal breath sounds.     Comments: Very mild exp wheezing Abdominal:     Palpations: Abdomen is soft.     Tenderness: There is abdominal tenderness in the periumbilical area.     Hernia: A hernia is present. Hernia is present in the umbilical area.  Musculoskeletal:     Right lower leg: No edema.     Left lower leg: No edema.  Skin:    General: Skin is warm and dry.     Findings: No erythema or rash.  Neurological:     Mental Status: She is alert.  Psychiatric:        Behavior: Behavior normal.     Comments: Alert to person, knows name of caretaker     ED Results / Procedures / Treatments   Labs (all labs ordered are listed, but only abnormal results are displayed) Labs Reviewed  CBC WITH DIFFERENTIAL/PLATELET - Abnormal; Notable for the following components:      Result Value   WBC 20.5 (*)    MCV 101.3 (*)    Platelets 510 (*)    Neutro Abs 17.0 (*)    Monocytes Absolute 1.4 (*)    Abs Immature Granulocytes 0.15 (*)    All other components within normal limits  COMPREHENSIVE METABOLIC PANEL - Abnormal; Notable for the following components:   Glucose, Bld 154 (*)    BUN 24 (*)    Creatinine, Ser 1.31 (*)    Albumin 2.3 (*)    ALT 62 (*)    Alkaline Phosphatase 130 (*)    GFR, Estimated 41 (*)    All other components within normal limits  CK - Abnormal; Notable for the following components:   Total CK 17 (*)    All other components within normal limits  CULTURE, BLOOD (ROUTINE X 2)  CULTURE, BLOOD (ROUTINE X 2)  URINALYSIS, ROUTINE W REFLEX MICROSCOPIC  AMMONIA  PROTIME-INR   LACTIC ACID, PLASMA  LACTIC ACID, PLASMA  CBG MONITORING, ED    EKG None  Radiology CT Head Wo Contrast  Result Date: 07/06/2022 CLINICAL DATA:  Fall, knee pain EXAM: CT HEAD WITHOUT CONTRAST TECHNIQUE: Contiguous axial images were obtained from the base of the skull through the vertex without intravenous contrast. RADIATION DOSE REDUCTION: This exam was performed according to the departmental dose-optimization program which includes automated exposure control, adjustment of the mA  and/or kV according to patient size and/or use of iterative reconstruction technique. COMPARISON:  None Available. FINDINGS: Brain: No acute intracranial hemorrhage. No focal mass lesion. No CT evidence of acute infarction. No midline shift or mass effect. No hydrocephalus. Basilar cisterns are patent. There are periventricular and subcortical white matter hypodensities. Generalized cortical atrophy. Vascular: No hyperdense vessel or unexpected calcification. Skull: Normal. Negative for fracture or focal lesion. Sinuses/Orbits: Paranasal sinuses and mastoid air cells are clear. Orbits are clear. Other: None. IMPRESSION: No acute intracranial findings. Atrophy and white matter microvascular disease. Electronically Signed   By: Suzy Bouchard M.D.   On: 07/06/2022 13:25   DG Knee Complete 4 Views Right  Result Date: 07/06/2022 CLINICAL DATA:  Fall 1 week ago, right knee pain EXAM: RIGHT KNEE - COMPLETE 4+ VIEW COMPARISON:  07/12/2019 FINDINGS: Osteopenia. No fracture or dislocation of the right knee. 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 No knee joint effusion. Soft tissues unremarkable. IMPRESSION: 1. Osteopenia. No fracture or dislocation of the right knee. 2. 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 Electronically Signed   By: Delanna Ahmadi M.D.   On: 07/06/2022 13:04   DG Chest 2 View  Result Date: 07/06/2022 CLINICAL DATA:  Weakness, fall  1 week ago altered mental status EXAM: CHEST - 2 VIEW COMPARISON:  07/12/2019 FINDINGS: The heart size and mediastinal contours are within normal limits. Probable trace pleural effusions. Disc degenerative disease of the thoracic spine. IMPRESSION: Probable trace pleural effusions. No focal airspace opacity. Electronically Signed   By: Delanna Ahmadi M.D.   On: 07/06/2022 13:02    Procedures Procedures  {Document cardiac monitor, telemetry assessment procedure when appropriate:1}  Medications Ordered in ED Medications  sodium chloride 0.9 % bolus 500 mL (has no administration in time range)    ED Course/ Medical Decision Making/ A&P                           Medical Decision Making Amount and/or Complexity of Data Reviewed Labs: ordered.   This patient presents to the ED for concern of change in mental status, this involves an extensive number of treatment options, and is a complaint that carries with it a high risk of complications and morbidity.  The differential diagnosis includes ***   Co morbidities that complicate the patient evaluation  Dementia, alzheimers, RA, frequent UTI.    Additional history obtained:  Additional history obtained from caregiver at bedside who contributes to history as above External records from outside source obtained and reviewed including ***   Lab Tests:  I Ordered, and personally interpreted labs.  The pertinent results include:  ***   Imaging Studies ordered:  I ordered imaging studies including ***  I independently visualized and interpreted imaging which showed *** I agree with the radiologist interpretation   Cardiac Monitoring: / EKG:  The patient was maintained on a cardiac monitor.  I personally viewed and interpreted the cardiac monitored which showed an underlying rhythm of: ***   Consultations Obtained:  I requested consultation with the ***,  and discussed lab and imaging findings as well as pertinent plan - they  recommend: ***   Problem List / ED Course / Critical interventions / Medication management  *** I ordered medication including ***  for ***  Reevaluation of the patient after these medicines showed that the patient {resolved/improved/worsened:23923::"improved"} I have reviewed the patients home medicines and have made adjustments as needed  Social Determinants of Health:  ***   Test / Admission - Considered:  ***   {Document critical care time when appropriate:1} {Document review of labs and clinical decision tools ie heart score, Chads2Vasc2 etc:1}  {Document your independent review of radiology images, and any outside records:1} {Document your discussion with family members, caretakers, and with consultants:1} {Document social determinants of health affecting pt's care:1} {Document your decision making why or why not admission, treatments were needed:1} Final Clinical Impression(s) / ED Diagnoses Final diagnoses:  None    Rx / DC Orders ED Discharge Orders     None

## 2022-07-06 NOTE — ED Triage Notes (Signed)
Pt here from home with c/o fall with knee pain , sent over for further eval for possible"not acting her norm "

## 2022-07-07 ENCOUNTER — Inpatient Hospital Stay (HOSPITAL_COMMUNITY): Payer: Medicare Other | Admitting: Registered Nurse

## 2022-07-07 ENCOUNTER — Other Ambulatory Visit: Payer: Self-pay

## 2022-07-07 ENCOUNTER — Emergency Department (HOSPITAL_COMMUNITY): Payer: Medicare Other

## 2022-07-07 ENCOUNTER — Inpatient Hospital Stay (HOSPITAL_COMMUNITY): Payer: Medicare Other

## 2022-07-07 ENCOUNTER — Encounter (HOSPITAL_COMMUNITY)
Admission: EM | Disposition: A | Discharge status: Skilled Nursing Facility | Payer: Self-pay | Source: Home / Self Care | Attending: Internal Medicine

## 2022-07-07 ENCOUNTER — Encounter (HOSPITAL_COMMUNITY): Payer: Self-pay | Admitting: Internal Medicine

## 2022-07-07 DIAGNOSIS — Z6832 Body mass index (BMI) 32.0-32.9, adult: Secondary | ICD-10-CM | POA: Diagnosis not present

## 2022-07-07 DIAGNOSIS — Z79899 Other long term (current) drug therapy: Secondary | ICD-10-CM | POA: Diagnosis not present

## 2022-07-07 DIAGNOSIS — M069 Rheumatoid arthritis, unspecified: Secondary | ICD-10-CM | POA: Diagnosis present

## 2022-07-07 DIAGNOSIS — E669 Obesity, unspecified: Secondary | ICD-10-CM | POA: Diagnosis present

## 2022-07-07 DIAGNOSIS — R7989 Other specified abnormal findings of blood chemistry: Secondary | ICD-10-CM | POA: Diagnosis not present

## 2022-07-07 DIAGNOSIS — Z87442 Personal history of urinary calculi: Secondary | ICD-10-CM | POA: Diagnosis not present

## 2022-07-07 DIAGNOSIS — M1990 Unspecified osteoarthritis, unspecified site: Secondary | ICD-10-CM | POA: Diagnosis not present

## 2022-07-07 DIAGNOSIS — N132 Hydronephrosis with renal and ureteral calculous obstruction: Secondary | ICD-10-CM | POA: Diagnosis present

## 2022-07-07 DIAGNOSIS — N179 Acute kidney failure, unspecified: Secondary | ICD-10-CM | POA: Diagnosis present

## 2022-07-07 DIAGNOSIS — Z741 Need for assistance with personal care: Secondary | ICD-10-CM | POA: Diagnosis not present

## 2022-07-07 DIAGNOSIS — G309 Alzheimer's disease, unspecified: Secondary | ICD-10-CM | POA: Diagnosis present

## 2022-07-07 DIAGNOSIS — N281 Cyst of kidney, acquired: Secondary | ICD-10-CM | POA: Diagnosis not present

## 2022-07-07 DIAGNOSIS — M6259 Muscle wasting and atrophy, not elsewhere classified, multiple sites: Secondary | ICD-10-CM | POA: Diagnosis not present

## 2022-07-07 DIAGNOSIS — N1330 Unspecified hydronephrosis: Secondary | ICD-10-CM | POA: Diagnosis not present

## 2022-07-07 DIAGNOSIS — R9341 Abnormal radiologic findings on diagnostic imaging of renal pelvis, ureter, or bladder: Secondary | ICD-10-CM

## 2022-07-07 DIAGNOSIS — L409 Psoriasis, unspecified: Secondary | ICD-10-CM | POA: Diagnosis present

## 2022-07-07 DIAGNOSIS — Z1152 Encounter for screening for COVID-19: Secondary | ICD-10-CM | POA: Diagnosis not present

## 2022-07-07 DIAGNOSIS — N136 Pyonephrosis: Secondary | ICD-10-CM | POA: Diagnosis present

## 2022-07-07 DIAGNOSIS — F02A0 Dementia in other diseases classified elsewhere, mild, without behavioral disturbance, psychotic disturbance, mood disturbance, and anxiety: Secondary | ICD-10-CM | POA: Diagnosis present

## 2022-07-07 DIAGNOSIS — Z7401 Bed confinement status: Secondary | ICD-10-CM | POA: Diagnosis not present

## 2022-07-07 DIAGNOSIS — N323 Diverticulum of bladder: Secondary | ICD-10-CM | POA: Diagnosis not present

## 2022-07-07 DIAGNOSIS — N1831 Chronic kidney disease, stage 3a: Secondary | ICD-10-CM | POA: Diagnosis present

## 2022-07-07 DIAGNOSIS — Z043 Encounter for examination and observation following other accident: Secondary | ICD-10-CM | POA: Diagnosis not present

## 2022-07-07 DIAGNOSIS — M6281 Muscle weakness (generalized): Secondary | ICD-10-CM | POA: Diagnosis not present

## 2022-07-07 DIAGNOSIS — E538 Deficiency of other specified B group vitamins: Secondary | ICD-10-CM | POA: Diagnosis present

## 2022-07-07 DIAGNOSIS — N201 Calculus of ureter: Secondary | ICD-10-CM | POA: Diagnosis not present

## 2022-07-07 DIAGNOSIS — R41841 Cognitive communication deficit: Secondary | ICD-10-CM | POA: Diagnosis not present

## 2022-07-07 DIAGNOSIS — F05 Delirium due to known physiological condition: Secondary | ICD-10-CM | POA: Diagnosis present

## 2022-07-07 DIAGNOSIS — R4182 Altered mental status, unspecified: Secondary | ICD-10-CM

## 2022-07-07 DIAGNOSIS — Z87440 Personal history of urinary (tract) infections: Secondary | ICD-10-CM | POA: Diagnosis not present

## 2022-07-07 DIAGNOSIS — A4181 Sepsis due to Enterococcus: Secondary | ICD-10-CM | POA: Diagnosis present

## 2022-07-07 DIAGNOSIS — N189 Chronic kidney disease, unspecified: Secondary | ICD-10-CM | POA: Diagnosis not present

## 2022-07-07 DIAGNOSIS — J449 Chronic obstructive pulmonary disease, unspecified: Secondary | ICD-10-CM | POA: Diagnosis present

## 2022-07-07 DIAGNOSIS — F32A Depression, unspecified: Secondary | ICD-10-CM | POA: Diagnosis present

## 2022-07-07 DIAGNOSIS — N390 Urinary tract infection, site not specified: Secondary | ICD-10-CM | POA: Diagnosis not present

## 2022-07-07 DIAGNOSIS — Z79631 Long term (current) use of antimetabolite agent: Secondary | ICD-10-CM | POA: Diagnosis not present

## 2022-07-07 DIAGNOSIS — I129 Hypertensive chronic kidney disease with stage 1 through stage 4 chronic kidney disease, or unspecified chronic kidney disease: Secondary | ICD-10-CM | POA: Diagnosis present

## 2022-07-07 DIAGNOSIS — G9341 Metabolic encephalopathy: Secondary | ICD-10-CM | POA: Diagnosis present

## 2022-07-07 DIAGNOSIS — D259 Leiomyoma of uterus, unspecified: Secondary | ICD-10-CM | POA: Diagnosis not present

## 2022-07-07 DIAGNOSIS — R2681 Unsteadiness on feet: Secondary | ICD-10-CM | POA: Diagnosis not present

## 2022-07-07 DIAGNOSIS — I10 Essential (primary) hypertension: Secondary | ICD-10-CM

## 2022-07-07 DIAGNOSIS — Z7982 Long term (current) use of aspirin: Secondary | ICD-10-CM | POA: Diagnosis not present

## 2022-07-07 DIAGNOSIS — E559 Vitamin D deficiency, unspecified: Secondary | ICD-10-CM | POA: Diagnosis present

## 2022-07-07 DIAGNOSIS — R404 Transient alteration of awareness: Secondary | ICD-10-CM | POA: Diagnosis not present

## 2022-07-07 DIAGNOSIS — R54 Age-related physical debility: Secondary | ICD-10-CM | POA: Diagnosis present

## 2022-07-07 HISTORY — PX: CYSTOSCOPY W/ URETERAL STENT PLACEMENT: SHX1429

## 2022-07-07 LAB — URINALYSIS, ROUTINE W REFLEX MICROSCOPIC
Bilirubin Urine: NEGATIVE
Glucose, UA: NEGATIVE mg/dL
Hgb urine dipstick: NEGATIVE
Ketones, ur: NEGATIVE mg/dL
Nitrite: NEGATIVE
Protein, ur: 100 mg/dL — AB
Specific Gravity, Urine: 1.014 (ref 1.005–1.030)
WBC, UA: >50 WBC/hpf — ABNORMAL HIGH (ref 0–5)
pH: 5 (ref 5.0–8.0)

## 2022-07-07 LAB — PROTIME-INR
INR: 1.2 (ref 0.8–1.2)
Prothrombin Time: 14.8 seconds (ref 11.4–15.2)

## 2022-07-07 LAB — AMMONIA
Ammonia: 17 umol/L (ref 9–35)
Ammonia: 20 umol/L (ref 9–35)

## 2022-07-07 LAB — LACTIC ACID, PLASMA: Lactic Acid, Venous: 1.4 mmol/L (ref 0.5–1.9)

## 2022-07-07 LAB — RESP PANEL BY RT-PCR (FLU A&B, COVID) ARPGX2
Influenza A by PCR: NEGATIVE
Influenza B by PCR: NEGATIVE
SARS Coronavirus 2 by RT PCR: NEGATIVE

## 2022-07-07 SURGERY — CYSTOSCOPY, WITH RETROGRADE PYELOGRAM AND URETERAL STENT INSERTION
Anesthesia: General | Site: Ureter | Laterality: Left

## 2022-07-07 MED ORDER — PROPOFOL 10 MG/ML IV BOLUS
INTRAVENOUS | Status: AC
  Filled 2022-07-07: qty 20

## 2022-07-07 MED ORDER — LACTATED RINGERS IV SOLN: INTRAVENOUS | Status: DC

## 2022-07-07 MED ORDER — PROPOFOL 10 MG/ML IV BOLUS
INTRAVENOUS | Status: DC | PRN
  Administered 2022-07-07: 100 mg via INTRAVENOUS

## 2022-07-07 MED ORDER — ONDANSETRON HCL 4 MG/2ML IJ SOLN
INTRAMUSCULAR | Status: AC
  Filled 2022-07-07: qty 6

## 2022-07-07 MED ORDER — KETOROLAC TROMETHAMINE 30 MG/ML IJ SOLN
INTRAMUSCULAR | Status: AC
  Filled 2022-07-07: qty 2

## 2022-07-07 MED ORDER — LIDOCAINE HCL URETHRAL/MUCOSAL 2 % EX GEL
CUTANEOUS | Status: AC
  Filled 2022-07-07: qty 11

## 2022-07-07 MED ORDER — IOHEXOL 350 MG/ML SOLN
75.0000 mL | Freq: Once | INTRAVENOUS | Status: AC | PRN
  Administered 2022-07-07: 75 mL via INTRAVENOUS

## 2022-07-07 MED ORDER — ENOXAPARIN SODIUM 40 MG/0.4ML IJ SOSY
40.0000 mg | PREFILLED_SYRINGE | INTRAMUSCULAR | Status: DC
  Administered 2022-07-08 – 2022-07-12 (×5): 40 mg via SUBCUTANEOUS
  Filled 2022-07-07 (×5): qty 0.4

## 2022-07-07 MED ORDER — CHLORHEXIDINE GLUCONATE 0.12 % MT SOLN
OROMUCOSAL | Status: AC
  Administered 2022-07-07: 15 mL via OROMUCOSAL
  Filled 2022-07-07: qty 15

## 2022-07-07 MED ORDER — ONDANSETRON HCL 4 MG/2ML IJ SOLN: 4.0000 mg | Freq: Four times a day (QID) | INTRAMUSCULAR | Status: DC | PRN

## 2022-07-07 MED ORDER — ORAL CARE MOUTH RINSE: 15.0000 mL | Freq: Once | OROMUCOSAL | Status: AC

## 2022-07-07 MED ORDER — ROCURONIUM BROMIDE 10 MG/ML (PF) SYRINGE
PREFILLED_SYRINGE | INTRAVENOUS | Status: DC | PRN
  Administered 2022-07-07: 20 mg via INTRAVENOUS

## 2022-07-07 MED ORDER — CHLORHEXIDINE GLUCONATE 0.12 % MT SOLN: 15.0000 mL | Freq: Once | OROMUCOSAL | Status: AC

## 2022-07-07 MED ORDER — LIDOCAINE 2% (20 MG/ML) 5 ML SYRINGE
INTRAMUSCULAR | Status: DC | PRN
  Administered 2022-07-07: 80 mg via INTRAVENOUS

## 2022-07-07 MED ORDER — ENOXAPARIN SODIUM 40 MG/0.4ML IJ SOSY: 40.0000 mg | PREFILLED_SYRINGE | INTRAMUSCULAR | Status: DC

## 2022-07-07 MED ORDER — ALBUTEROL SULFATE HFA 108 (90 BASE) MCG/ACT IN AERS
INHALATION_SPRAY | RESPIRATORY_TRACT | Status: DC | PRN
  Administered 2022-07-07: 8 via RESPIRATORY_TRACT

## 2022-07-07 MED ORDER — 0.9 % SODIUM CHLORIDE (POUR BTL) OPTIME
TOPICAL | Status: DC | PRN
  Administered 2022-07-07: 1000 mL

## 2022-07-07 MED ORDER — ACETAMINOPHEN 325 MG PO TABS
650.0000 mg | ORAL_TABLET | Freq: Four times a day (QID) | ORAL | Status: DC | PRN
  Administered 2022-07-11: 650 mg via ORAL
  Filled 2022-07-07: qty 2

## 2022-07-07 MED ORDER — ONDANSETRON HCL 4 MG/2ML IJ SOLN
INTRAMUSCULAR | Status: DC | PRN
  Administered 2022-07-07: 4 mg via INTRAVENOUS

## 2022-07-07 MED ORDER — DEXAMETHASONE SODIUM PHOSPHATE 10 MG/ML IJ SOLN
INTRAMUSCULAR | Status: DC | PRN
  Administered 2022-07-07: 5 mg via INTRAVENOUS

## 2022-07-07 MED ORDER — IOHEXOL 300 MG/ML  SOLN
INTRAMUSCULAR | Status: DC | PRN
  Administered 2022-07-07: 25 mL via URETHRAL

## 2022-07-07 MED ORDER — FENTANYL CITRATE (PF) 100 MCG/2ML IJ SOLN: 25.0000 ug | INTRAMUSCULAR | Status: DC | PRN

## 2022-07-07 MED ORDER — SUCCINYLCHOLINE CHLORIDE 200 MG/10ML IV SOSY
PREFILLED_SYRINGE | INTRAVENOUS | Status: DC | PRN
  Administered 2022-07-07: 140 mg via INTRAVENOUS

## 2022-07-07 MED ORDER — STERILE WATER FOR IRRIGATION IR SOLN
Status: DC | PRN
  Administered 2022-07-07: 3000 mL

## 2022-07-07 MED ORDER — SUGAMMADEX SODIUM 200 MG/2ML IV SOLN
INTRAVENOUS | Status: DC | PRN
  Administered 2022-07-07: 200 mg via INTRAVENOUS

## 2022-07-07 MED ORDER — FENTANYL CITRATE (PF) 250 MCG/5ML IJ SOLN
INTRAMUSCULAR | Status: DC | PRN
  Administered 2022-07-07: 100 ug via INTRAVENOUS

## 2022-07-07 MED ORDER — SODIUM CHLORIDE 0.9 % IV SOLN
1.0000 g | INTRAVENOUS | Status: DC
  Administered 2022-07-07 – 2022-07-08 (×2): 1 g via INTRAVENOUS
  Filled 2022-07-07 (×5): qty 10

## 2022-07-07 MED ORDER — ONDANSETRON HCL 4 MG PO TABS: 4.0000 mg | ORAL_TABLET | Freq: Four times a day (QID) | ORAL | Status: DC | PRN

## 2022-07-07 MED ORDER — FENTANYL CITRATE (PF) 250 MCG/5ML IJ SOLN
INTRAMUSCULAR | Status: AC
  Filled 2022-07-07: qty 5

## 2022-07-07 MED ORDER — SODIUM CHLORIDE 0.9 % IV SOLN
1.0000 g | Freq: Once | INTRAVENOUS | Status: AC
  Administered 2022-07-07: 1 g via INTRAVENOUS
  Filled 2022-07-07: qty 10

## 2022-07-07 MED ORDER — DEXAMETHASONE SODIUM PHOSPHATE 10 MG/ML IJ SOLN
INTRAMUSCULAR | Status: AC
  Filled 2022-07-07: qty 3

## 2022-07-07 MED ORDER — KETOROLAC TROMETHAMINE 15 MG/ML IJ SOLN
INTRAMUSCULAR | Status: DC | PRN
  Administered 2022-07-07: 15 mg via INTRAVENOUS

## 2022-07-07 MED ORDER — ACETAMINOPHEN 650 MG RE SUPP: 650.0000 mg | Freq: Four times a day (QID) | RECTAL | Status: DC | PRN

## 2022-07-07 SURGICAL SUPPLY — 18 items
BAG URINE DRAIN 2000ML AR STRL (UROLOGICAL SUPPLIES) ×1 IMPLANT
BAG URO CATCHER STRL LF (MISCELLANEOUS) ×1 IMPLANT
CATH FOLEY 2WAY SLVR  5CC 16FR (CATHETERS) ×1
CATH FOLEY 2WAY SLVR 5CC 16FR (CATHETERS) IMPLANT
GLOVE BIO SURGEON STRL SZ7.5 (GLOVE) ×1 IMPLANT
GOWN STRL REUS W/ TWL LRG LVL3 (GOWN DISPOSABLE) ×1 IMPLANT
GOWN STRL REUS W/ TWL XL LVL3 (GOWN DISPOSABLE) ×1 IMPLANT
GOWN STRL REUS W/TWL LRG LVL3 (GOWN DISPOSABLE) ×1
GOWN STRL REUS W/TWL XL LVL3 (GOWN DISPOSABLE) ×1
GUIDEWIRE STR DUAL SENSOR (WIRE) ×1 IMPLANT
KIT TURNOVER KIT B (KITS) ×1 IMPLANT
NS IRRIG 1000ML POUR BTL (IV SOLUTION) ×1 IMPLANT
PACK CYSTO (CUSTOM PROCEDURE TRAY) ×1 IMPLANT
PAD ARMBOARD 7.5X6 YLW CONV (MISCELLANEOUS) ×2 IMPLANT
STENT URET 6FRX24 CONTOUR (STENTS) IMPLANT
SYPHON OMNI JUG (MISCELLANEOUS) ×1 IMPLANT
TOWEL GREEN STERILE FF (TOWEL DISPOSABLE) ×1 IMPLANT
UNDERPAD 30X36 HEAVY ABSORB (UNDERPADS AND DIAPERS) ×1 IMPLANT

## 2022-07-07 NOTE — Assessment & Plan Note (Deleted)
UTI, obstructing ureteral calculus, elevated WBC and low grade fever with Tm of 100 (technically only 1/4 SIRS with the WBC at this point so not meeting sepsis criteria just yet). History of same back in 2020 (actually, I admitted her that time as well it appears). 1. NPO 2. Urology consulted, presumably will need to get emergent ureteral stent. 1. Update: Urology note confirms. 3. Rocephin 4. IVF

## 2022-07-07 NOTE — Anesthesia Postprocedure Evaluation (Signed)
Anesthesia Post Note  Patient: Danielle Dixon  Procedure(s) Performed: CYSTOSCOPY WITH RETROGRADE PYELOGRAM/URETERAL STENT PLACEMENT (Left: Ureter)     Patient location during evaluation: PACU Anesthesia Type: General Level of consciousness: awake Pain management: pain level controlled Vital Signs Assessment: post-procedure vital signs reviewed and stable Respiratory status: spontaneous breathing Cardiovascular status: stable Postop Assessment: no apparent nausea or vomiting Anesthetic complications: no   No notable events documented.  Last Vitals:  Vitals:   07/07/22 1429 07/07/22 1800  BP: (!) 149/63 (!) 162/79  Pulse: 96 84  Resp: 18 20  Temp: 36.8 C 36.4 C  SpO2:  93%    Last Pain:  Vitals:   07/07/22 1800  TempSrc:   PainSc: 0-No pain                 Jacquetta Polhamus

## 2022-07-07 NOTE — Assessment & Plan Note (Addendum)
CKD stage 3a  Renal function with improvement in serum cr at 0,87, K is 4.0 and serum bicarbonate at 26. Plan to continue close follow up on renal function and electrolytes.

## 2022-07-07 NOTE — H&P (Signed)
History and Physical    Patient: Danielle Dixon HAL:[id number] DOB: [birth year] DOA: 07/06/2022 DOS: the patient was seen and examined on 07/07/2022 PCP: Pcp, No  Patient coming from: ALF/ILF  Chief Complaint:  Chief Complaint  Patient presents with   Fall   HPI: Danielle Dixon is a 81 y.o. female with medical history significant of Alzheimer's dementia, still in ILF, RA, HTN.  Pt previously admitted for AMS, sepsis due to pyohydronephrosis from UTI + obstructing ureteral stone in Dec 2020.  Got stent at that time followed by nephrolithotomy in Jan.  Pt not admitted to hospital again since that time ~3 years ago now.  Pt presents to ED from [facility].  Reports fall Sunday (about a week and half ago), laid on the ground all night, EMS came in the morning and patient refused transport. Patient was unable to ambulate after the full well, progressive cognitive decline, wears diapers but isn't changed regularly and has frequent UTIs. Friend returned on Sunday, concerned pt is not ambulating well and is confused/not herself. Also occasional cough (productive with white sputum). Went to PCP earlier this week who was concerned about elevated ammonia level which was 192 at yesterdays visit.  In ED: Ammonia level is only 17 on our labs. Pt with Tm 100 Pt with WBC 20k Pt with UTI. Pt with mild AKI. And sure enough, CT AP shows severe L sided hydronephrosis and hydroureter from obstructing ureteral calculi.   Review of Systems: As mentioned in the history of present illness. All other systems reviewed and are negative. Past Medical History:  Diagnosis Date   Alzheimer's dementia ([place])    per records from [facility]   Anemia    Anxiety    Arthritis    ra   Constipation    COPD (chronic obstructive pulmonary disease) ([place])    Dementia ([place])    Dementia ([place])    Depression    Dyspnea 06/25/2013   Hypertension    Leukocytosis    Leukocytosis, unspecified 06/25/2013   Leukocytosis,  unspecified 06/25/2013   Psoriasis    Rheumatoid arthritis ([place])    Unspecified vitamin D deficiency 06/25/2013   Vitamin B12 deficiency    Past Surgical History:  Procedure Laterality Date   APPENDECTOMY     CYSTOSCOPY W/ URETERAL STENT PLACEMENT Bilateral 07/10/2019   Procedure: CYSTOSCOPY WITH RETROGRADE PYELOGRAM AND BILATERAL URETERAL STENT PLACEMENT;  Surgeon: Lucas Mallow, MD;  Location: [facility];  Service: Urology;  Laterality: Bilateral;   CYSTOSCOPY/URETEROSCOPY/HOLMIUM LASER/STENT PLACEMENT Bilateral 07/29/2019   Procedure: CYSTOSCOPY LEFT RETROGRADE PYELOGRAM LEFT URETEROSCOPY HOLMIUM LASER LEFT STENT PLACEMENT POSSIBLE RIGHT URETEROSCOPY LASER LITHOTRIPSY AND STENT;  Surgeon: Lucas Mallow, MD;  Location: [facility];  Service: Urology;  Laterality: Bilateral;   IR URETERAL STENT RIGHT NEW ACCESS W/O SEP NEPHROSTOMY CATH  09/18/2019   knee replacment Left    NEPHROLITHOTOMY Right 09/18/2019   Procedure: NEPHROLITHOTOMY PERCUTANEOUS;  Surgeon: Lucas Mallow, MD;  Location: [facility];  Service: Urology;  Laterality: Right;   TOE SURGERY     Social History:  reports that she has never smoked. She has never used smokeless tobacco. She reports that she does not drink alcohol and does not use drugs.  No Known Allergies  Family History  Problem Relation Age of Onset   Cancer Mother        breast cancer   Cancer Father        lung    Prior to Admission medications  Medication Sig Start Date End Date Taking? Authorizing Provider  albuterol (VENTOLIN HFA) 108 (90 Base) MCG/ACT inhaler Inhale 2 puffs into the lungs 4 (four) times daily. (0900, 1200, 1600, & 2000)    [provider]  ALPRAZolam (XANAX) 0.25 MG tablet Take 0.25 mg by mouth daily as needed for anxiety.    [provider]  aspirin 81 MG chewable tablet Chew 81 mg by mouth daily. (0900)    [provider]  atenolol (TENORMIN) 25 MG tablet Take 25 mg by mouth daily. (0900)    [provider]  cholecalciferol (VITAMIN D) 1000 UNITS tablet Take 1 tablet (1,000 Units total) by mouth daily. Patient taking differently: Take 1,000 Units by mouth daily. (0900) 06/25/13   Heath Lark, MD  clobetasol cream (TEMOVATE) 4.01 % Apply 1 application topically 2 (two) times daily as needed (rash).    [provider]  donepezil (ARICEPT) 10 MG tablet Take 10 mg by mouth at bedtime. (2100)    [provider]  Ensure (ENSURE) Take 237 mLs by mouth 3 (three) times daily. (0800, 1200, & 1700)    [provider]  folic acid (FOLVITE) 1 MG tablet Take 1 mg by mouth daily. (0900)    [provider]  Glycerin-Hypromellose-PEG 400 (DRY EYE RELIEF DROPS) 0.2-0.2-1 % SOLN Place 1 drop into both eyes 4 (four) times daily as needed (dry eyes).    [provider]  guaifenesin (ROBITUSSIN) 100 MG/5ML syrup Take 200 mg by mouth 4 (four) times daily as needed for cough.    [provider]  HYDROcodone-acetaminophen (NORCO) 5-325 MG tablet Take 1 tablet by mouth every 4 (four) hours as needed for moderate pain. 09/18/19   Lucas Mallow, MD  methotrexate (RHEUMATREX) 2.5 MG tablet Take 17.5 mg by mouth every Wednesday. (0900)    [provider]  PARoxetine (PAXIL-CR) 25 MG 24 hr tablet Take 25 mg by mouth daily. (0600)    [provider]  polyethylene glycol (MIRALAX / GLYCOLAX) 17 g packet Take 17 g by mouth daily as needed for moderate constipation.    [provider]  simvastatin (ZOCOR) 10 MG tablet Take 10 mg by mouth at bedtime. (2100)    [provider]  triamcinolone cream (KENALOG) 0.1 % Apply 1 application topically 2 (two) times daily as needed (psoriasis/rash).     [provider]  vitamin B-12 (CYANOCOBALAMIN) 500 MCG tablet Take 500 mcg by mouth daily. (0900)    [provider]  zafirlukast (ACCOLATE) 20 MG tablet Take 20 mg by mouth 2 (two) times daily. (0900 & 2100)    [provider]    Physical Exam: Vitals:   07/07/22 0035 07/07/22 0040 07/07/22 0100 07/07/22 0300  BP:   (!) 138/51 135/78  Pulse:   69 71  Resp:   (!) 32 16  Temp: 98.5 F (36.9 C) 100 F (37.8 C)    TempSrc: Oral Rectal    SpO2:   96% 96%  Weight: 90.7 kg     Height: '5\' 6"'$  (1.676 m)      Constitutional: NAD, calm, comfortable Eyes: PERRL, lids and conjunctivae normal ENMT: Mucous membranes are moist. Posterior pharynx clear of any exudate or lesions.Normal dentition.  Neck: normal, supple, no masses, no thyromegaly Respiratory: clear to auscultation bilaterally, no wheezing, no crackles. Normal respiratory effort. No accessory muscle use.  Cardiovascular: Regular rate and rhythm, no murmurs / rubs / gallops. No extremity edema. 2+ pedal pulses.  No carotid bruits.  Abdomen: Diffuse TTP but nonfocal. Musculoskeletal: no clubbing / cyanosis. No joint deformity upper and lower extremities. Good ROM, no contractures. Normal muscle tone.  Skin: no rashes, lesions, ulcers. No induration Neurologic: CN 2-12 grossly intact. Sensation intact, DTR normal. Strength 5/5 in all 4.  Psychiatric: Oriented to self, caregiver.  Data Reviewed:    CT AP: IMPRESSION: 1. Adjacent 2 mm ureteral calculi within the distal left ureter, with marked severity left-sided hydronephrosis and hydroureter. 2. Cluster of subcentimeter nonobstructing right renal calculi. 3. Colonic diverticulosis. 4. Findings likely consistent with a calcified fibroid uterus. 5. Fat containing umbilical hernia. 6. Marked severity multilevel degenerative changes throughout the lumbar spine. 7. Aortic atherosclerosis. 8. Stable mildly hyperdense soft tissue nodule adjacent to the inferolateral aspect of the urinary bladder on the left. MRI correlation is recommended, as an underlying neoplastic process cannot be excluded.  Ammonia 17  CPK 17  Lactate 1.4     Latest Ref Rng & Units 07/06/2022   12:03 PM 09/19/2019    11:02 AM 09/19/2019    5:17 AM  CBC  WBC 4.0 - 10.5 K/uL 20.5     Hemoglobin 12.0 - 15.0 g/dL 12.4  10.1  10.2   Hematocrit 36.0 - 46.0 % 39.3  33.3  33.4   Platelets 150 - 400 K/uL 510         Latest Ref Rng & Units 07/06/2022   12:03 PM 09/19/2019    5:17 AM 09/18/2019    2:12 PM  CMP  Glucose 70 - 99 mg/dL 154  161  116   BUN 8 - 23 mg/dL '24  19  12   '$ Creatinine 0.44 - 1.00 mg/dL 1.31  1.36  0.99   Sodium 135 - 145 mmol/L 138  140  144   Potassium 3.5 - 5.1 mmol/L 4.1  4.3  3.9   Chloride 98 - 111 mmol/L 100  106  109   CO2 22 - 32 mmol/L '24  26  24   '$ Calcium 8.9 - 10.3 mg/dL 8.9  8.6  8.8   Total Protein 6.5 - 8.1 g/dL 6.5     Total Bilirubin 0.3 - 1.2 mg/dL 1.2     Alkaline Phos 38 - 126 U/L 130     AST 15 - 41 U/L 33     ALT 0 - 44 U/L 62        Assessment and Plan: * Pyohydronephrosis UTI, obstructing ureteral calculus, elevated WBC and low grade fever with Tm of 100 (technically only 1/4 SIRS with the WBC at this point so not meeting sepsis criteria just yet). History of same back in 2020 (actually, I admitted her that time as well it appears). NPO Urology consulted, presumably will need to get emergent ureteral stent. Update: Urology note confirms. Rocephin IVF  Abnormal CT scan, bladder Abnormal nodule seen next to bladder on CT, ? Neoplastic. Presumably will need emergent ureteroscopy anyhow for pyohydronephrosis. If nothing diagnostic found on ureteroscopy with respect to this then get MRI as recommended.  Acute metabolic encephalopathy Delirium secondary to pyohydronephrosis.  Vs just progressive dementia from alzheimer's. Reported severe hyperammonemia from PCP (192) according to EDP note; however, this doesn't seem to be present at all on our lab work, ammonia only 17, remainder of liver numbers also look normal. Repeat ammonia level draw, just-in-case our ammonia of 17 is the lab error instead of PCPs ammonia. May need higher level of care depending on  mental status following treatment  of acute illness.  Elevated serum creatinine Mild AKI vs CKD 3a IVF Ureteral stent for severe hydronephrosis Trend with repeat CMP.  Rheumatoid arthritis ([place]) Med rec pending at this time (previously on MTX)  Mild dementia ([place]) Pt with known mild dementia at baseline Cont home meds when med rec completed. See acute metabolic encephalopathy discussion above.      Advance Care Planning:   Code Status: Full Code  Consults: Urology  Family Communication: Caregiver / friend present at bedside.  Severity of Illness: The appropriate patient status for this patient is INPATIENT. Inpatient status is judged to be reasonable and necessary in order to provide the required intensity of service to ensure the patient's safety. The patient's presenting symptoms, physical exam findings, and initial radiographic and laboratory data in the context of their chronic comorbidities is felt to place them at high risk for further clinical deterioration. Furthermore, it is not anticipated that the patient will be medically stable for discharge from the hospital within 2 midnights of admission.   * I certify that at the point of admission it is my clinical judgment that the patient will require inpatient hospital care spanning beyond 2 midnights from the point of admission due to high intensity of service, high risk for further deterioration and high frequency of surveillance required.*  Author: Etta Quill., DO 07/07/2022 4:02 AM  For on call review [email or web address].

## 2022-07-07 NOTE — Anesthesia Procedure Notes (Addendum)
Procedure Name: Intubation Date/Time: 07/07/2022 5:08 PM  Performed by: Elvin So, CRNAPre-anesthesia Checklist: Patient identified, Emergency Drugs available, Suction available and Patient being monitored Patient Re-evaluated:Patient Re-evaluated prior to induction Oxygen Delivery Method: Circle System Utilized Preoxygenation: Pre-oxygenation with 100% oxygen Induction Type: IV induction Ventilation: Mask ventilation without difficulty Laryngoscope Size: Mac and 3 Grade View: Grade I Tube type: Oral Number of attempts: 1 Airway Equipment and Method: Stylet and Oral airway Placement Confirmation: ETT inserted through vocal cords under direct vision, positive ETCO2 and breath sounds checked- equal and bilateral Secured at: 22 cm Tube secured with: Tape Dental Injury: Teeth and Oropharynx as per pre-operative assessment

## 2022-07-07 NOTE — Assessment & Plan Note (Addendum)
Pt with known mild dementia at baseline Cont home meds when med rec completed. See acute metabolic encephalopathy discussion above.

## 2022-07-07 NOTE — Progress Notes (Signed)
Patient arrived at the unit,vitals checked,CCMD notified.CHG bath given.oriented to self.

## 2022-07-07 NOTE — Transfer of Care (Signed)
Immediate Anesthesia Transfer of Care Note  Patient: Danielle Dixon  Procedure(s) Performed: CYSTOSCOPY WITH RETROGRADE PYELOGRAM/URETERAL STENT PLACEMENT (Left: Ureter)  Patient Location: PACU  Anesthesia Type:General  Level of Consciousness: awake and patient cooperative  Airway & Oxygen Therapy: Patient Spontanous Breathing and Patient connected to face mask oxygen  Post-op Assessment: Report given to RN, Post -op Vital signs reviewed and stable, and Patient moving all extremities  Post vital signs: Reviewed and stable  Last Vitals:  Vitals Value Taken Time  BP 162/79 07/07/22 1800  Temp    Pulse 24 07/07/22 1801  Resp 29 07/07/22 1801  SpO2 81 % 07/07/22 1801  Vitals shown include unvalidated device data.  Last Pain:  Vitals:   07/07/22 1429  TempSrc: Oral  PainSc: 0-No pain         Complications: No notable events documented.

## 2022-07-07 NOTE — Op Note (Signed)
Preoperative diagnosis: Left hydronephrosis, left distal stone Postoperative diagnosis: Same  Procedure: Cystoscopy with left retrograde pyelogram, left ureteroscopy, left ureteral stent placement  Surgeon: Junious Silk  Anesthesia: General  Indication for procedure: Etrulia is an 81 year old female presented with acute mental status changes and UTI.  She had an elevated white count and low-grade fever.  She was brought for stent today.  After resuscitation she did stabilize nicely but ultrasound showed continued left hydronephrosis.  Findings: On exam she had moderate vaginal atrophy but a normal vulva without mass or lesion.  Meatus was retracted.  On cystoscopy the urethra and the bladder were unremarkable.  She did have the right small diverticulum.  The urine was cloudy but flush to clear and there were no mucosal lesions.  There was E flux of dark urine from the right system but none from the left.  Left retrograde pyelogram-this outlined a single ureter single collecting system unit with a narrow distal ureter which dilated at about the area of the stones on the CT with moderate to severe dilation of the ureter up through the remainder of the distal ureter mid and proximal ureter and dilation of the renal pelvis and collecting system.  On left ureteroscopy the left distal ureter was tight around the stent and I could passed through the intramural tunnel without difficulty but once in the distal ureter could not navigate through the distal narrow segment.  She will need follow-up ureteroscopy.  Description of procedure: After consent was obtained the patient was brought to the operating room.  After adequate anesthesia she was placed in lithotomy position and prepped and draped in the usual sterile fashion.  A timeout was performed to confirm the patient and procedure.  The cystoscope was passed per urethra and the bladder inspected I then cannulated the left ureteral orifice with a 5 [place]  open-ended catheter and left retrograde injection of contrast was performed.  I then passed a sensor wire up into the upper calyx.  I then passed a semirigid ureteroscope which went into the ureter without difficulty but then the ureter narrowed and I could not advance proximally.  The scope was backed out and the wire backloaded on the cystoscope.  A 6 x 24 cm stent was advanced with a good coil seen up in the upper calyx and a good coil in the bladder.  The stent was draining some debris laden urine.  I placed a 16 French Foley catheter to gravity drainage to max drain the system.  She was then awakened and taken the cover room in stable condition.  Complications: None  Blood loss: Minimal  Specimens: None  Drains: 6 x 24 cm left ureteral stent, 16 French Foley catheter  Disposition: Patient stable to PACU-I called and went over the procedure, postop care and follow-up with her sister [name].

## 2022-07-07 NOTE — Consult Note (Signed)
Urology Consult Note   Requesting Attending Physician:  Ripley Fraise, MD Service Providing Consult: Urology   Reason for Consult:  Nephrolithiasis  HPI: Danielle Dixon is seen in consultation for reasons noted above at the request of Ripley Fraise, MD. Patient's history is limited by her mental status and worsened by acute confusion. She is accompanied by a caretaker who provides collateral history. She notes that she has had symptoms for about a week, including confusion, inability to care for self, and a fall sometime over the last week. During a work-up in the ED she was noted to have a UTI. A CT was obtained for abdominal discomfort that demonstrates left hydroureteronephrosis in the setting of punctate calcifications within the distal left ureter. The patient denies any lower urinary tract symptoms, specifically dysuria, hematuria or urinary tract infection. The patient has a history of and vomiting. No fevers, chills, lower back pain.  She has a previous history of kidney stones which includes R PCNL in 09/2019 and bilateral USE in 07/2019 with Dr. Gloriann Loan.   Past Medical History: Past Medical History:  Diagnosis Date   Alzheimer's dementia ([place])    per records from [facility]   Anemia    Anxiety    Arthritis    ra   Constipation    COPD (chronic obstructive pulmonary disease) ([place])    Dementia ([place])    Dementia ([place])    Depression    Dyspnea 06/25/2013   Hypertension    Leukocytosis    Leukocytosis, unspecified 06/25/2013   Leukocytosis, unspecified 06/25/2013   Psoriasis    Rheumatoid arthritis ([place])    Unspecified vitamin D deficiency 06/25/2013   Vitamin B12 deficiency     Past Surgical History:  Past Surgical History:  Procedure Laterality Date   APPENDECTOMY     CYSTOSCOPY W/ URETERAL STENT PLACEMENT Bilateral 07/10/2019   Procedure: CYSTOSCOPY WITH RETROGRADE PYELOGRAM AND BILATERAL URETERAL STENT PLACEMENT;  Surgeon: Lucas Mallow, MD;  Location: [facility]  ORS;  Service: Urology;  Laterality: Bilateral;   CYSTOSCOPY/URETEROSCOPY/HOLMIUM LASER/STENT PLACEMENT Bilateral 07/29/2019   Procedure: CYSTOSCOPY LEFT RETROGRADE PYELOGRAM LEFT URETEROSCOPY HOLMIUM LASER LEFT STENT PLACEMENT POSSIBLE RIGHT URETEROSCOPY LASER LITHOTRIPSY AND STENT;  Surgeon: Lucas Mallow, MD;  Location: [facility] ORS;  Service: Urology;  Laterality: Bilateral;   IR URETERAL STENT RIGHT NEW ACCESS W/O SEP NEPHROSTOMY CATH  09/18/2019   knee replacment Left    NEPHROLITHOTOMY Right 09/18/2019   Procedure: NEPHROLITHOTOMY PERCUTANEOUS;  Surgeon: Lucas Mallow, MD;  Location: [facility] ORS;  Service: Urology;  Laterality: Right;   TOE SURGERY      Medication: No current facility-administered medications for this encounter.   Current Outpatient Medications  Medication Sig Dispense Refill   albuterol (VENTOLIN HFA) 108 (90 Base) MCG/ACT inhaler Inhale 2 puffs into the lungs 4 (four) times daily. (0900, 1200, 1600, & 2000)     ALPRAZolam (XANAX) 0.25 MG tablet Take 0.25 mg by mouth daily as needed for anxiety.     aspirin 81 MG chewable tablet Chew 81 mg by mouth daily. (0900)     atenolol (TENORMIN) 25 MG tablet Take 25 mg by mouth daily. (0900)     cholecalciferol (VITAMIN D) 1000 UNITS tablet Take 1 tablet (1,000 Units total) by mouth daily. (Patient taking differently: Take 1,000 Units by mouth daily. (0900)) 30 tablet 3   clobetasol cream (TEMOVATE) 1.57 % Apply 1 application topically 2 (two) times daily as needed (rash).     donepezil (ARICEPT) 10 MG  tablet Take 10 mg by mouth at bedtime. (2100)     Ensure (ENSURE) Take 237 mLs by mouth 3 (three) times daily. (0800, 5916, & 3846)     folic acid (FOLVITE) 1 MG tablet Take 1 mg by mouth daily. (0900)     Glycerin-Hypromellose-PEG 400 (DRY EYE RELIEF DROPS) 0.2-0.2-1 % SOLN Place 1 drop into both eyes 4 (four) times daily as needed (dry eyes).     guaifenesin (ROBITUSSIN) 100 MG/5ML syrup Take 200 mg by mouth 4 (four) times daily as  needed for cough.     HYDROcodone-acetaminophen (NORCO) 5-325 MG tablet Take 1 tablet by mouth every 4 (four) hours as needed for moderate pain. 10 tablet 0   methotrexate (RHEUMATREX) 2.5 MG tablet Take 17.5 mg by mouth every Wednesday. (0900)     PARoxetine (PAXIL-CR) 25 MG 24 hr tablet Take 25 mg by mouth daily. (0600)     polyethylene glycol (MIRALAX / GLYCOLAX) 17 g packet Take 17 g by mouth daily as needed for moderate constipation.     simvastatin (ZOCOR) 10 MG tablet Take 10 mg by mouth at bedtime. (2100)     triamcinolone cream (KENALOG) 0.1 % Apply 1 application topically 2 (two) times daily as needed (psoriasis/rash).      vitamin B-12 (CYANOCOBALAMIN) 500 MCG tablet Take 500 mcg by mouth daily. (0900)     zafirlukast (ACCOLATE) 20 MG tablet Take 20 mg by mouth 2 (two) times daily. (0900 & 2100)      Allergies: No Known Allergies  Social History: Social History   Tobacco Use   Smoking status: Never   Smokeless tobacco: Never  Vaping Use   Vaping Use: Never used  Substance Use Topics   Alcohol use: No   Drug use: No    Family History Family History  Problem Relation Age of Onset   Cancer Mother        breast cancer   Cancer Father        lung    Review of Systems 10 systems were reviewed and are negative except as noted specifically in the HPI.  Objective   Vital signs in last 24 hours: BP 135/78   Pulse 71   Temp 100 F (37.8 C) (Rectal)   Resp 16   Ht '5\' 6"'$  (1.676 m)   Wt 90.7 kg   SpO2 96%   BMI 32.28 kg/m   Physical Exam General: NAD, A&O to self, resting, appropriate HEENT: [place]/AT, EOMI, MMM Pulmonary: Normal work of breathing Cardiovascular: HDS, adequate peripheral perfusion Abdomen: Soft, nondistended. Diffuse tenderness to palpation but nonfocal GU: Incontinent of urine, no CVA tenderness elicited Extremities: warm and well perfused Neuro: Appropriate, no focal neurological deficits  Most Recent Labs: Lab Results  Component Value Date    WBC 20.5 (H) 07/06/2022   HGB 12.4 07/06/2022   HCT 39.3 07/06/2022   PLT 510 (H) 07/06/2022    Lab Results  Component Value Date   NA 138 07/06/2022   K 4.1 07/06/2022   CL 100 07/06/2022   CO2 24 07/06/2022   BUN 24 (H) 07/06/2022   CREATININE 1.31 (H) 07/06/2022   CALCIUM 8.9 07/06/2022   MG 1.7 07/10/2019   PHOS 3.1 07/10/2019    Lab Results  Component Value Date   INR 1.2 07/07/2022     Urine Culture: '@[id number]'$ (laburin,org,[id number],[id number])@   IMAGING: CT Abdomen Pelvis W Contrast  Result Date: 07/07/2022 CLINICAL DATA:  Status post fall with altered mental status. EXAM: CT ABDOMEN AND  PELVIS WITH CONTRAST TECHNIQUE: Multidetector CT imaging of the abdomen and pelvis was performed using the standard protocol following bolus administration of intravenous contrast. RADIATION DOSE REDUCTION: This exam was performed according to the departmental dose-optimization program which includes automated exposure control, adjustment of the mA and/or kV according to patient size and/or use of iterative reconstruction technique. CONTRAST:  61m OMNIPAQUE IOHEXOL 350 MG/ML SOLN COMPARISON:  July 10, 2019 FINDINGS: Lower chest: Mild atelectasis is seen within the bilateral lung bases. Hepatobiliary: A stable 5 mm focus of parenchymal low attenuation is seen within the posterior aspect of a shrunken, lobulated left lobe of the liver. No gallstones, gallbladder wall thickening, or biliary dilatation. Pancreas: Unremarkable. No pancreatic ductal dilatation or surrounding inflammatory changes. Spleen: Normal in size without focal abnormality. Adrenals/Urinary Tract: There is mild calcification of the lateral limb of the left adrenal gland. Kidneys are normal in size. Multiple simple cysts are seen within the left kidney. The largest measures approximately 4.4 cm x 2.2 cm. Adjacent 2 mm ureteral calculi are noted within the distal left ureter (axial CT images 74 and 75, CT series 3). Marked  severity left-sided hydronephrosis and hydroureter are also seen, with mild left-sided peripelvic and periureteral inflammatory fat stranding. A cluster of subcentimeter nonobstructing renal calculi are seen within the posterior aspect of the mid right kidney. An 18 mm x 13 mm urinary bladder diverticulum is seen along the posterolateral aspect of the bladder on the right. Stable 18 mm x 19 mm x 23 mm well-defined mildly hyperdense (approximately 57.42 Hounsfield units) soft tissue nodule is seen adjacent to the inferolateral aspect of the urinary bladder on the left (axial CT images 80 through 83, CT series 3). Stomach/Bowel: Stomach is within normal limits. Appendix appears normal. No evidence of bowel wall thickening, distention, or inflammatory changes. Noninflamed diverticula are seen throughout the large bowel. Vascular/Lymphatic: Aortic atherosclerosis. No enlarged abdominal or pelvic lymph nodes. Reproductive: The uterus is lobulated and heterogeneous in appearance. A 3.9 cm x 3.3 cm exophytic, calcified fibroid is seen within the uterine fundus on the right. The bilateral adnexa are unremarkable. Other: There is a 4.0 cm x 2.2 cm fat containing umbilical hernia. No abdominopelvic ascites. Musculoskeletal: Marked severity multilevel degenerative changes are seen throughout the lumbar spine. IMPRESSION: 1. Adjacent 2 mm ureteral calculi within the distal left ureter, with marked severity left-sided hydronephrosis and hydroureter. 2. Cluster of subcentimeter nonobstructing right renal calculi. 3. Colonic diverticulosis. 4. Findings likely consistent with a calcified fibroid uterus. 5. Fat containing umbilical hernia. 6. Marked severity multilevel degenerative changes throughout the lumbar spine. 7. Aortic atherosclerosis. 8. Stable mildly hyperdense soft tissue nodule adjacent to the inferolateral aspect of the urinary bladder on the left. MRI correlation is recommended, as an underlying neoplastic process  cannot be excluded. Aortic Atherosclerosis (ICD10-I70.0). Electronically Signed   By: TVirgina NorfolkM.D.   On: 07/07/2022 01:47   CT Head Wo Contrast  Result Date: 07/06/2022 CLINICAL DATA:  Fall, knee pain EXAM: CT HEAD WITHOUT CONTRAST TECHNIQUE: Contiguous axial images were obtained from the base of the skull through the vertex without intravenous contrast. RADIATION DOSE REDUCTION: This exam was performed according to the departmental dose-optimization program which includes automated exposure control, adjustment of the mA and/or kV according to patient size and/or use of iterative reconstruction technique. COMPARISON:  None Available. FINDINGS: Brain: No acute intracranial hemorrhage. No focal mass lesion. No CT evidence of acute infarction. No midline shift or mass effect. No hydrocephalus. Basilar cisterns are patent.  There are periventricular and subcortical white matter hypodensities. Generalized cortical atrophy. Vascular: No hyperdense vessel or unexpected calcification. Skull: Normal. Negative for fracture or focal lesion. Sinuses/Orbits: Paranasal sinuses and mastoid air cells are clear. Orbits are clear. Other: None. IMPRESSION: No acute intracranial findings. Atrophy and white matter microvascular disease. Electronically Signed   By: Suzy Bouchard M.D.   On: 07/06/2022 13:25   DG Knee Complete 4 Views Right  Result Date: 07/06/2022 CLINICAL DATA:  Fall 1 week ago, right knee pain EXAM: RIGHT KNEE - COMPLETE 4+ VIEW COMPARISON:  07/12/2019 FINDINGS: Osteopenia. No fracture or dislocation of the right knee. 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 No knee joint effusion. Soft tissues unremarkable. IMPRESSION: 1. Osteopenia. No fracture or dislocation of the right knee. 2. 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 Electronically Signed   By: Delanna Ahmadi M.D.   On: 07/06/2022 13:04   DG Chest 2 View  Result Date:  07/06/2022 CLINICAL DATA:  Weakness, fall 1 week ago altered mental status EXAM: CHEST - 2 VIEW COMPARISON:  07/12/2019 FINDINGS: The heart size and mediastinal contours are within normal limits. Probable trace pleural effusions. Disc degenerative disease of the thoracic spine. IMPRESSION: Probable trace pleural effusions. No focal airspace opacity. Electronically Signed   By: Delanna Ahmadi M.D.   On: 07/06/2022 13:02    ------  Assessment:  81 y.o. female with COPD, dementia, nephrolithiasis who presented with confusion, recent fall flank pain, UTI and CT that demonstrates 59m obstructing stone with the distal left ureter with associated hydronephrosis.   Concerning for infection given: leukocytosis, UA WBC > 50  We discussed the indications for acute intervention including infected obstruction, bilateral ureteral obstruction or unilateral obstruction of solitary kidney as well as other less urgent indications for decompression which would included intractable pain, N/V, and acute renal injury. We also discussed the possible inability to place a ureteral stent in which case, the next intervention recommended would be a percutaneous nephrostomy tube. Given concern for infection is present, plan for left ureteral stent placement.   Recommendations: Plan for emergent left ureteral stent placement Case posted; confirmed with OR front desk Keep patient NPO [facility]   Thank you for this consult. Please contact the urology consult pager with any further questions/concerns.

## 2022-07-07 NOTE — Assessment & Plan Note (Addendum)
Delirium secondary to pyohydronephrosis.  Vs just progressive dementia from alzheimer's. Ammonia level is 17 and 20   Her mentation has improved with supportive medical therapy, including IV fluids and IV antibiotic therapy. Patient is very deconditioned, follow up with Pt and Ot.  Continue paroxetine.

## 2022-07-07 NOTE — Assessment & Plan Note (Addendum)
Abnormal nodule seen next to bladder on CT, ? Neoplastic. 1. Presumably will need emergent ureteroscopy anyhow for pyohydronephrosis. 2. If nothing diagnostic found on ureteroscopy with respect to this then get MRI as recommended.

## 2022-07-07 NOTE — Progress Notes (Signed)
Briefly, patient is an 81 year old female with dementia, HTN and RA who was admitted earlier this morning for sepsis due to recurrent pyohydronephrosis from UTI complicated by obstructing ureteral stone.  Patient had similar presentation in December 2020.    This morning patient herself has no particular complaints, she states she is comfortable.  Denies any pain.  Patient was started on IV ceftriaxone and taken to the OR by urology. Of note, patient was also noted to have an abnormal nodule in her bladder and if nothing is found on uteroscopy, MRI is recommended for further evaluation.

## 2022-07-07 NOTE — Progress Notes (Addendum)
Per Urology: "Unfortunately the OR had a trauma that bumped our case for Danielle Dixon that was supposed to go early in the AM. We're going to plan to do her around 3pm. Can you keep her NPO until that time please?"  Will add day team and RN to message to keep them in the loop.  Delay start of lovenox for DVT ppx until tomorrow.

## 2022-07-07 NOTE — ED Notes (Signed)
  Pt transported to ct 

## 2022-07-07 NOTE — Anesthesia Preprocedure Evaluation (Signed)
Anesthesia Evaluation  Patient identified by MRN, date of birth, ID band Patient awake    Reviewed: Allergy & Precautions, NPO status , Patient's Chart, lab work & pertinent test results  Airway Mallampati: II       Dental   Pulmonary COPD,    breath sounds clear to auscultation       Cardiovascular hypertension,  Rhythm:Regular Rate:Normal     Neuro/Psych    GI/Hepatic negative GI ROS, Neg liver ROS,   Endo/Other    Renal/GU Renal disease     Musculoskeletal  (+) Arthritis ,   Abdominal   Peds  Hematology   Anesthesia Other Findings   Reproductive/Obstetrics                             Anesthesia Physical Anesthesia Plan  ASA: 3  Anesthesia Plan: General   Post-op Pain Management:    Induction: Intravenous  PONV Risk Score and Plan: Ondansetron and Dexamethasone  Airway Management Planned: Oral ETT  Additional Equipment:   Intra-op Plan:   Post-operative Plan: Extubation in OR  Informed Consent: I have reviewed the patients History and Physical, chart, labs and discussed the procedure including the risks, benefits and alternatives for the proposed anesthesia with the patient or authorized representative who has indicated his/her understanding and acceptance.     Dental advisory given  Plan Discussed with: CRNA and Anesthesiologist  Anesthesia Plan Comments:         Anesthesia Quick Evaluation

## 2022-07-07 NOTE — Assessment & Plan Note (Addendum)
Hold on methotrexate for now.

## 2022-07-07 NOTE — Discharge Instructions (Signed)
Be sure to follow-up with Dr. Gloriann Loan to arrange ureteral stent management.

## 2022-07-08 ENCOUNTER — Encounter (HOSPITAL_COMMUNITY): Payer: Self-pay | Admitting: Urology

## 2022-07-08 MED ORDER — CHLORHEXIDINE GLUCONATE CLOTH 2 % EX PADS
6.0000 | MEDICATED_PAD | Freq: Every day | CUTANEOUS | Status: DC
  Administered 2022-07-08 – 2022-07-10 (×3): 6 via TOPICAL

## 2022-07-08 NOTE — Progress Notes (Signed)
Progress Note   Patient: Danielle Dixon NGE:[id number] DOB: 13-Jul-1941 DOA: 07/06/2022     1 DOS: the patient was seen and examined on 07/08/2022   Brief hospital course: Briefly, patient is an 81 year old female with dementia, HTN and RA who was admitted earlier this morning for sepsis due to recurrent pyohydronephrosis from UTI complicated by obstructing ureteral stone.  Patient had similar presentation in December 2020.  Patient diagnosis of pyohydronephrosis with UTI, evaluated by urologist and underwentCystoscopy with left retrograde pyelogram, left ureteroscopy, left ureteral stent placement.  Assessment and Plan: * Pyohydronephrosis UTI, obstructing ureteral calculus, elevated WBC and low grade fever with Tm of 100 (technically only 1/4 SIRS with the WBC at this point so not meeting sepsis criteria just yet). History of same back in 2020 (actually, I admitted her that time as well it appears). -Evaluated by neurologist patient underwentCystoscopy with left retrograde pyelogram, left ureteroscopy, left ureteral stent placement - Continue IV fluids, IV antibiotics ceftriaxone to be continued - Urine cultures growing Enterococcus faecalis, susceptibilities pending  Abnormal CT scan, bladder Abnormal nodule seen next to bladder on CT, ? Neoplastic. Presumably will need emergent ureteroscopy anyhow for pyohydronephrosis. Nothing diagnostic was  found on ureteroscopy with respect to this hence  ordered MRI  Acute metabolic encephalopathy Delirium secondary to pyohydronephrosis.  Vs just progressive dementia from alzheimer's. Reported severe hyperammonemia from PCP (192) according to EDP note; however, this doesn't seem to be present at all on our lab work, ammonia only 17, remainder of liver numbers also look normal. Repeat ammonia level draw, just-in-case our ammonia of 17 is the lab error instead of PCPs ammonia.  Repeat ammonia was also 20 May need higher level of care depending on  mental status following treatment of acute illness.  Elevated serum creatinine Mild AKI vs CKD 3a IVF Ureteral stent for severe hydronephrosis Repeat CMP has been ordered.  Rheumatoid arthritis ([place]) Med rec pending at this time (previously on MTX)  Mild dementia ([place]) Pt with known mild dementia at baseline Cont home meds when med rec completed. See acute metabolic encephalopathy discussion above.        Subjective: Patient was seen and examined bedside today.  Patient appeared to be calm did not have any complaints she is oriented only to her name.  Physical Exam: Vitals:   07/07/22 2344 07/08/22 0339 07/08/22 0920 07/08/22 1328  BP: (!) 146/77 (!) 143/84 122/60 128/64  Pulse: 71 66 62 81  Resp: '16 19 20 18  '$ Temp:  97.7 F (36.5 C) (!) 97.5 F (36.4 C) 97.6 F (36.4 C)  TempSrc:  Oral Oral Oral  SpO2: 94% 99% 100% 96%  Weight:      Height:       Constitutional: NAD, calm, comfortable Eyes: PERRL, lids and conjunctivae normal ENMT: Mucous membranes are moist. Posterior pharynx clear of any exudate or lesions.Normal dentition.  Neck: normal, supple, no masses, no thyromegaly Respiratory: clear to auscultation bilaterally, no wheezing, no crackles. Normal respiratory effort. No accessory muscle use.  Cardiovascular: Regular rate and rhythm, no murmurs / rubs / gallops. No extremity edema. 2+ pedal pulses. No carotid bruits.  Abdomen: Diffuse TTP but nonfocal. Musculoskeletal: no clubbing / cyanosis. No joint deformity upper and lower extremities. Good ROM, no contractures. Normal muscle tone.  Skin: no rashes, lesions, ulcers. No induration Neurologic: CN 2-12 grossly intact. Sensation intact, DTR normal. Strength 5/5 in all 4.  Psychiatric: Oriented to self, caregiver. Data Reviewed:  There are no new results to review  at this time.    Disposition: Status is: Inpatient Remains inpatient appropriate because: Due to UTI, sepsis and encephalopathy  Planned  Discharge Destination: Skilled nursing facility    Time spent: 35 minutes  Author: Carlyle Lipa, MD 07/08/2022 3:58 PM  For on call review [email or web address].

## 2022-07-09 DIAGNOSIS — N179 Acute kidney failure, unspecified: Secondary | ICD-10-CM | POA: Diagnosis not present

## 2022-07-09 DIAGNOSIS — G9341 Metabolic encephalopathy: Secondary | ICD-10-CM | POA: Diagnosis not present

## 2022-07-09 DIAGNOSIS — N189 Chronic kidney disease, unspecified: Secondary | ICD-10-CM

## 2022-07-09 DIAGNOSIS — N136 Pyonephrosis: Secondary | ICD-10-CM | POA: Diagnosis not present

## 2022-07-09 DIAGNOSIS — M069 Rheumatoid arthritis, unspecified: Secondary | ICD-10-CM | POA: Diagnosis not present

## 2022-07-09 DIAGNOSIS — I10 Essential (primary) hypertension: Secondary | ICD-10-CM

## 2022-07-09 DIAGNOSIS — E669 Obesity, unspecified: Secondary | ICD-10-CM | POA: Diagnosis present

## 2022-07-09 LAB — URINE CULTURE: Culture: >=100000 — AB

## 2022-07-09 MED ORDER — DOCUSATE SODIUM 100 MG PO CAPS
100.0000 mg | ORAL_CAPSULE | Freq: Every morning | ORAL | Status: DC
  Administered 2022-07-09 – 2022-07-12 (×4): 100 mg via ORAL
  Filled 2022-07-09 (×4): qty 1

## 2022-07-09 MED ORDER — PANTOPRAZOLE SODIUM 40 MG PO TBEC
40.0000 mg | DELAYED_RELEASE_TABLET | Freq: Every day | ORAL | Status: DC
  Administered 2022-07-09 – 2022-07-12 (×4): 40 mg via ORAL
  Filled 2022-07-09 (×4): qty 1

## 2022-07-09 MED ORDER — ALBUTEROL SULFATE (2.5 MG/3ML) 0.083% IN NEBU: 2.5000 mg | INHALATION_SOLUTION | RESPIRATORY_TRACT | Status: DC | PRN

## 2022-07-09 MED ORDER — LORATADINE 10 MG PO TABS
10.0000 mg | ORAL_TABLET | Freq: Every day | ORAL | Status: DC
  Administered 2022-07-09 – 2022-07-12 (×4): 10 mg via ORAL
  Filled 2022-07-09 (×4): qty 1

## 2022-07-09 MED ORDER — SODIUM CHLORIDE 0.9 % IV SOLN
2.0000 g | Freq: Three times a day (TID) | INTRAVENOUS | Status: DC
  Administered 2022-07-09 – 2022-07-12 (×8): 2 g via INTRAVENOUS
  Filled 2022-07-09 (×11): qty 2000

## 2022-07-09 MED ORDER — ALBUTEROL SULFATE HFA 108 (90 BASE) MCG/ACT IN AERS: 2.0000 | INHALATION_SPRAY | Freq: Four times a day (QID) | RESPIRATORY_TRACT | Status: DC | PRN

## 2022-07-09 MED ORDER — PAROXETINE HCL 30 MG PO TABS
30.0000 mg | ORAL_TABLET | Freq: Every day | ORAL | Status: DC
  Administered 2022-07-09 – 2022-07-12 (×4): 30 mg via ORAL
  Filled 2022-07-09 (×4): qty 1

## 2022-07-09 MED ORDER — AMLODIPINE BESYLATE 5 MG PO TABS
5.0000 mg | ORAL_TABLET | Freq: Every day | ORAL | Status: DC
  Administered 2022-07-09 – 2022-07-12 (×4): 5 mg via ORAL
  Filled 2022-07-09 (×4): qty 1

## 2022-07-09 MED ORDER — FOLIC ACID 1 MG PO TABS
1.0000 mg | ORAL_TABLET | ORAL | Status: DC
  Administered 2022-07-09 – 2022-07-11 (×3): 1 mg via ORAL
  Filled 2022-07-09 (×3): qty 1

## 2022-07-09 NOTE — Assessment & Plan Note (Addendum)
Complicated with sepsis, present on admission.   Urine culture positive for enterococcus fecalis, sensitive to ampicillin.  Patient has been afebrile and wbc is down to 10 from 20.   Will transition to oral antibiotic therapy with IV Ampicillin  Continue to follow up cell count and temperature curve Plan to follow up as outpatient with urology.

## 2022-07-09 NOTE — Progress Notes (Signed)
Progress Note   Patient: Jackye Dever ZOX:[id number] DOB: Oct 28, 1940 DOA: 07/06/2022     2 DOS: the patient was seen and examined on 07/09/2022   Brief hospital course: Mrs. Chou was admitted to the hospital with the working diagnosis of pyohydronephrosis.   81 yo female with the past medical history of renal stones, dementia, hypertension and rheumatoid arthritis who presented after a mechanical fall. Patient sustained a fall about 7 days, EMS was called and she declined to be transported to the ED. She had difficulty ambulating, and progressive decline in her overall physical functional status. She developed worsening confusion and was brought to the hospital for further evaluation.  In 2020 patient had pyohydronephrosis and obstructing stone, required nephrolithotomy per urology. On her initial physical examination her blood pressure was 138/51, HR 69, RR 32 and 02 saturation 96%, lungs with no wheezing or rales, heart with S1 and S2 present and rhythmic with no gallops, abdomen with no distention, diffuse tender to palpation, no lower extremity edema.   Na 138, K 4,2 CL 100, bicarbonate 24, glucose 154 bun 24 cr 1,31  CK 17  Wbc 20,5 hgb 12.4 plt 510 Head CT with no acute changes.  Sars covid 19 negative  Urine analysis with SG 1,014, protein 100, > 50 wbc, 21-50 rbc  Urine culture enterococcus fecalis.   CT abdomen and pelvis with adjacent 2 mm ureteral calculi within the distal left ureter, with marked severity left sided hydronephrosis and hydroureter. Cluster of sub centimeter non obstructing right renal calculi.  Stable mild hyperdense soft tissue adjacent to the inferior lateral aspect of the urinary bladder on the left.   Renal US with mild left hydronephrosis. 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   Chest radiograph with no cardiomegaly and no infiltrates.   EKG 73 bpm, normal axis, normal intervals, sinus rhythm with no significant ST  segment or T wave changes.   Patient was placed on IV fluids and IV antibiotic therapy.  10/26 cystoscopy with left retrograde pyelogram, left ureteroscopy, left ureteral stent placement.   10/28 pelvic MRI ordered.   Assessment and Plan: * Pyohydronephrosis Complicated with sepsis, present on admission.   Urine culture positive for enterococcus fecalis Pending sensitivities. Patient has been afebrile.   Continue antibiotic therapy with IV ceftriaxone Hold on IV fluids  Follow up on cell count and temperature curve.  Patient has a foley catheter in place.    Acute metabolic encephalopathy Delirium secondary to pyohydronephrosis.  Vs just progressive dementia from alzheimer's. Ammonia level is 17 and 20   Her mentation has improved with supportive medical therapy, including IV fluids and IV antibiotic therapy. Patient is very deconditioned, follow up with Pt and Ot.  Continue paroxetine.   Acute kidney injury superimposed on chronic kidney disease ([place]) CKD stage 3a  Patient with improvement in hemodynamics Plan to follow up renal function and electrolytes. Patient sp ureteral stent for left hydronephrosis.   Rheumatoid arthritis ([place]) Hold on methotrexate for now.   Essential hypertension Systolic blood pressure 811 to 140 mmHG Plan to resume blood pressure control with amlodipine.   Obesity (BMI 30-39.9) Calculated BMI 32.2         Subjective: Patient with no chest pain or dyspnea, feeling better but not back to baseline, continue very weak and deconditioned   Physical Exam: Vitals:   07/08/22 2100 07/08/22 2318 07/09/22 0346 07/09/22 0747  BP:  (!) 130/55 128/62 (!) 144/67  Pulse:  69 67 64  Resp:  $'20 20 20 19  'x$ Temp:  (!) 97.5 F (36.4 C) 97.7 F (36.5 C) 98.1 F (36.7 C)  TempSrc:  Oral Oral Oral  SpO2:  98% 94% 99%  Weight:      Height:       Neurology awake and alert ENT with mild pallor Cardiovascular with S1 and S2 present and  rhythmic Respiratory with no rales or wheezing Abdomen protuberant but not tender or distended No lower extremity edema  Data Reviewed:    Family Communication: no family at the bedside   Disposition: Status is: Inpatient Remains inpatient appropriate because: IV antibiotic therapy   Planned Discharge Destination: Home    Author: Tawni Millers, MD 07/09/2022 10:46 AM  For on call review [email or web address].

## 2022-07-09 NOTE — Evaluation (Signed)
Physical Therapy Evaluation Patient Details Name: Danielle Dixon MRN: [id number] DOB: 03-25-1941 Today's Date: 07/09/2022  History of Present Illness  The pt is an 81 yo female presenting 10/25 after a fall at independent living facility on 10/15 after which she was on the ground overnight, reports progressive decline in mobility and cognition after that event. Pt found to have UTI with obstructing ureteral calculus and acute metabolic encephalopathy. PMH includes: Alzheimer's dementia, RA, frequent UTI, COPD, HTN, and HTN.   Clinical Impression  Pt in bed upon arrival of PT, agreeable to evaluation at this time. Prior to admission the pt reports she was independent with mobility at home using a RW, relies on her sister for transportation, and has had no other falls other than one on 10/15. The pt now presents with limitations in functional mobility, strength, power, endurance, dynamic stability, and activity tolerance due to above dx, and will continue to benefit from skilled PT to address these deficits. The pt was able to complete transfers and short pivotal steps with minA and use of RW, without overt LOB, but is unable to stand without assist due to poor strength in BLE and unable to tolerate more than 30 sec of standing or activity due to fatigue. Given mobility deficits, will need assist with any OOB mobility at this time which the pt states she does not have at her independent living facility. Will benefit from short stint SNF until more independent with transfers or until increased assist at home is available.        Recommendations for follow up therapy are one component of a multi-disciplinary discharge planning process, led by the attending physician.  Recommendations may be updated based on patient status, additional functional criteria and insurance authorization.  Follow Up Recommendations Skilled nursing-short term rehab (<3 hours/day) (unless frequent/constant assist and supervision  can be arranged, then could d/c with HHPT) Can patient physically be transported by private vehicle: Yes    Assistance Recommended at Discharge Frequent or constant Supervision/Assistance  Patient can return home with the following  A little help with walking and/or transfers;A little help with bathing/dressing/bathroom;Assistance with cooking/housework;Direct supervision/assist for medications management;Direct supervision/assist for financial management;Assist for transportation;Help with stairs or ramp for entrance    Equipment Recommendations None recommended by PT  Recommendations for Other Services       Functional Status Assessment Patient has had a recent decline in their functional status and demonstrates the ability to make significant improvements in function in a reasonable and predictable amount of time.     Precautions / Restrictions Precautions Precautions: Fall Restrictions Weight Bearing Restrictions: No      Mobility  Bed Mobility Overal bed mobility: Needs Assistance Bed Mobility: Supine to Sit, Sit to Supine     Supine to sit: Min assist Sit to supine: Min assist   General bed mobility comments: minA to complete movement and trunk elevation, increased time and effort, assist for LE to return to bed    Transfers Overall transfer level: Needs assistance Equipment used: Rolling walker (2 wheels) Transfers: Sit to/from Stand, Bed to chair/wheelchair/BSC Sit to Stand: Min assist   Step pivot transfers: Min assist       General transfer comment: minA to rise to standing despite use of UE. small steps to [facility]    Ambulation/Gait Ambulation/Gait assistance: Min assist Gait Distance (Feet): 3 Feet Assistive device: Rolling walker (2 wheels) Gait Pattern/deviations: Step-to pattern, Decreased stride length Gait velocity: decreased Gait velocity interpretation: <1.31 ft/sec, indicative of household  ambulator   General Gait Details: small lateral steps to  and from [facility], no overt LOB but limited due to fatigue     Balance Overall balance assessment: Needs assistance Sitting-balance support: No upper extremity supported, Feet supported Sitting balance-Leahy Scale: Good     Standing balance support: Bilateral upper extremity supported, During functional activity Standing balance-Leahy Scale: Poor Standing balance comment: dependent on BUE support                             Pertinent Vitals/Pain Pain Assessment Pain Assessment: No/denies pain    Home Living Family/patient expects to be discharged to:: Private residence (independent living) Living Arrangements: Alone Available Help at Discharge: Family;Available PRN/intermittently Type of Home: Apartment Home Access: Level entry       Home Layout: One level Home Equipment: Conservation officer, nature (2 wheels);Shower seat;Grab bars - toilet;Grab bars - tub/shower      Prior Function Prior Level of Function : Independent/Modified Independent             Mobility Comments: uses RW but states she can walk a mile almost every day, reports only 1 fall in 6 months ADLs Comments: pt reports no difficulties with ADLs, not driving     Hand Dominance   Dominant Hand: Right    Extremity/Trunk Assessment   Upper Extremity Assessment Upper Extremity Assessment: Generalized weakness    Lower Extremity Assessment Lower Extremity Assessment: Generalized weakness    Cervical / Trunk Assessment Cervical / Trunk Assessment: Kyphotic  Communication   Communication: No difficulties  Cognition Arousal/Alertness: Awake/alert Behavior During Therapy: WFL for tasks assessed/performed Overall Cognitive Status: History of cognitive impairments - at baseline                                 General Comments: pt with noted hx of dementia, but oriented to self, situation, and able to answer questions appropriately.        General Comments General comments (skin  integrity, edema, etc.): VSS on RA        Assessment/Plan    PT Assessment Patient needs continued PT services  PT Problem List Decreased strength;Decreased balance;Decreased activity tolerance;Decreased mobility;Decreased safety awareness;Decreased cognition       PT Treatment Interventions DME instruction;Gait training;Functional mobility training;Therapeutic activities;Therapeutic exercise;Balance training;Patient/family education    PT Goals (Current goals can be found in the Care Plan section)  Acute Rehab PT Goals Patient Stated Goal: to get some sleep PT Goal Formulation: With patient Time For Goal Achievement: 07/23/22 Potential to Achieve Goals: Fair    Frequency Min 3X/week        AM-PAC PT "6 Clicks" Mobility  Outcome Measure Help needed turning from your back to your side while in a flat bed without using bedrails?: A Little Help needed moving from lying on your back to sitting on the side of a flat bed without using bedrails?: A Little Help needed moving to and from a bed to a chair (including a wheelchair)?: A Little Help needed standing up from a chair using your arms (e.g., wheelchair or bedside chair)?: A Little Help needed to walk in hospital room?: Total Help needed climbing 3-5 steps with a railing? : Total 6 Click Score: 14    End of Session Equipment Utilized During Treatment: Gait belt Activity Tolerance: Patient tolerated treatment well;Patient limited by fatigue Patient left: in bed;with call bell/phone within  reach;with bed alarm set Nurse Communication: Mobility status PT Visit Diagnosis: Other abnormalities of gait and mobility (R26.89);Muscle weakness (generalized) (M62.81)    Time: 8088-1103 PT Time Calculation (min) (ACUTE ONLY): 26 min   Charges:   PT Evaluation $PT Eval Low Complexity: 1 Low PT Treatments $Therapeutic Activity: 8-22 mins        [place], PT, DPT   Acute Rehabilitation Department   Sandra Cockayne 07/09/2022,  5:35 PM

## 2022-07-09 NOTE — Progress Notes (Signed)
2 Days Post-Op   Subjective/Chief Complaint:  1 - LEFT Distal Ureteral Stone / ?Stricture - s/p left JJ stent 10/26 for distal stone in setting of early pyelo.  2 - Obstructing Pyelonephritis - UCX 10/25 enterococcus / sens pending, Improving on rocephin.  Today "Danielle Dixon" is stable / improving. NO fevers in 24 hours. Good UOP. She feels much improved in terms of pain and malaise as well.    Objective: Vital signs in last 24 hours: Temp:  [97.5 F (36.4 C)-98.2 F (36.8 C)] 97.7 F (36.5 C) (10/28 0346) Pulse Rate:  [62-81] 67 (10/28 0346) Resp:  [16-21] 20 (10/28 0346) BP: (122-136)/(55-66) 128/62 (10/28 0346) SpO2:  [94 %-100 %] 94 % (10/28 0346) Last BM Date :  (pt unable to tell)  Intake/Output from previous day: 10/27 0701 - 10/28 0700 In: 480 [P.O.:480] Out: 1025 [Urine:1025] Intake/Output this shift: No intake/output data recorded.  NAD, very frail, some obesity Non-labored breathing RRR SNTND, NO CVAT Foley in place with clear yellow urien, removed.   Lab Results:  Recent Labs    07/06/22 1203  WBC 20.5*  HGB 12.4  HCT 39.3  PLT 510*   BMET Recent Labs    07/06/22 1203  NA 138  K 4.1  CL 100  CO2 24  GLUCOSE 154*  BUN 24*  CREATININE 1.31*  CALCIUM 8.9   PT/INR Recent Labs    07/07/22 0001  LABPROT 14.8  INR 1.2   ABG No results for input(s): "PHART", "HCO3" in the last 72 hours.  Invalid input(s): "PCO2", "PO2"  Studies/Results: DG C-Arm 1-60 Min  Result Date: 07/07/2022 CLINICAL DATA:  Elective surgery. Cystoscopy with retrograde. Stent placement. EXAM: DG C-ARM 1-60 MIN COMPARISON:  CT and ultrasound 07/07/2022 FINDINGS: Intraoperative fluoroscopy is utilized for surgical control purposes. Fluoroscopy time was 49.6 seconds. Dose 10.670 mGy. Six spot fluoroscopic images. Spot fluoroscopic images demonstrate cystoscopy with cannulation of the left ureter. Contrast injection shows prominent hydronephrosis and hydroureter to the level of  the low pelvis. Subsequent images demonstrate a ureteral stent placed. IMPRESSION: Intraoperative fluoroscopy obtained for surgical control purposes demonstrating left ureteral stent placement. Electronically Signed   By: Lucienne Capers M.D.   On: 07/07/2022 19:43   US RENAL  Result Date: 07/07/2022 CLINICAL DATA:  Hydronephrosis EXAM: RENAL / URINARY TRACT ULTRASOUND COMPLETE COMPARISON:  CT abdomen pelvis 07/07/2022 FINDINGS: Right Kidney: Renal measurements: 10.0 x 4.7 x 4.9 cm = volume: 121 mL. Normal cortical thickness and echogenicity. No mass, hydronephrosis or shadowing calcification. Left Kidney: Renal measurements: 12.4 x 6.6 x 6.8 cm = volume: 289 mL. Normal cortical thickness and echogenicity. Mild LEFT hydronephrosis. Multiple LEFT renal cysts largest measuring 5.3 cm and 3.4 cm in greatest sizes; no follow-up imaging recommended. No shadowing calculi. Bladder: Dependent debris in bladder. Ureteral jets were not visualized. RIGHT-sided bladder diverticulum noted as on CT. Other: N/A IMPRESSION: Mild LEFT hydronephrosis. 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 Electronically Signed   By: Lavonia Dana M.D.   On: 07/07/2022 13:58    Anti-infectives: Anti-infectives (From admission, onward)    Start     Dose/Rate Route Frequency Ordered Stop   07/07/22 2200  cefTRIAXone (ROCEPHIN) 1 g in sodium chloride 0.9 % 100 mL IVPB        1 g 200 mL/hr over 30 Minutes Intravenous Every 24 hours 07/07/22 0434     07/07/22 0045  cefTRIAXone (ROCEPHIN) 1 g in sodium chloride 0.9 % 100 mL IVPB  1 g 200 mL/hr over 30 Minutes Intravenous  Once 07/07/22 0043 07/07/22 0223       Assessment/Plan:  Doing well after stentign for disal sotne in setting of infection. I feel OK for DC from GU perspective at any point now afebrile x 24 hours. We will request outpatient followup with Dr. Gloriann Loan to discuss definitive stone management in elective setting. Appreciate hospitaliast  team comanagment.    Danielle Dixon 07/09/2022

## 2022-07-09 NOTE — Hospital Course (Signed)
Danielle Dixon was admitted to the hospital with the working diagnosis of pyohydronephrosis.   81 yo female with the past medical history of renal stones, dementia, hypertension and rheumatoid arthritis who presented after a mechanical fall. Patient sustained a fall about 7 days, EMS was called and she declined to be transported to the ED. She had difficulty ambulating, and progressive decline in her overall physical functional status. She developed worsening confusion and was brought to the hospital for further evaluation.  In 2020 patient had pyohydronephrosis and obstructing stone, required nephrolithotomy per urology. On her initial physical examination her blood pressure was 138/51, HR 69, RR 32 and 02 saturation 96%, lungs with no wheezing or rales, heart with S1 and S2 present and rhythmic with no gallops, abdomen with no distention, diffuse tender to palpation, no lower extremity edema.   Na 138, K 4,2 CL 100, bicarbonate 24, glucose 154 bun 24 cr 1,31  CK 17  Wbc 20,5 hgb 12.4 plt 510 Head CT with no acute changes.  Sars covid 19 negative  Urine analysis with SG 1,014, protein 100, > 50 wbc, 21-50 rbc  Urine culture enterococcus fecalis.   CT abdomen and pelvis with adjacent 2 mm ureteral calculi within the distal left ureter, with marked severity left sided hydronephrosis and hydroureter. Cluster of sub centimeter non obstructing right renal calculi.  Stable mild hyperdense soft tissue adjacent to the inferior lateral aspect of the urinary bladder on the left.   Renal US with mild left hydronephrosis. 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   Chest radiograph with no cardiomegaly and no infiltrates.   EKG 73 bpm, normal axis, normal intervals, sinus rhythm with no significant ST segment or T wave changes.   Patient was placed on IV fluids and IV antibiotic therapy.  10/26 cystoscopy with left retrograde pyelogram, left ureteroscopy, left ureteral stent  placement.   10/28 pelvic MRI with well define homogeneous lesion in the left pelvis, adjacent to the bladder. Looks extramural, has been stable and probably benign in nature. Plan to follow up as outpatient.  10/29 patient very weak and deconditioned, patient lives alone at home. Physical therapy has recommended transfer to SNF to continue therapy.

## 2022-07-09 NOTE — Assessment & Plan Note (Signed)
Calculated BMI 32.2

## 2022-07-09 NOTE — Assessment & Plan Note (Signed)
Systolic blood pressure 889 to 140 mmHG Plan to resume blood pressure control with amlodipine.

## 2022-07-10 ENCOUNTER — Inpatient Hospital Stay (HOSPITAL_COMMUNITY): Payer: Medicare Other

## 2022-07-10 DIAGNOSIS — G9341 Metabolic encephalopathy: Secondary | ICD-10-CM | POA: Diagnosis not present

## 2022-07-10 DIAGNOSIS — N179 Acute kidney failure, unspecified: Secondary | ICD-10-CM | POA: Diagnosis not present

## 2022-07-10 DIAGNOSIS — M069 Rheumatoid arthritis, unspecified: Secondary | ICD-10-CM | POA: Diagnosis not present

## 2022-07-10 DIAGNOSIS — N136 Pyonephrosis: Secondary | ICD-10-CM | POA: Diagnosis not present

## 2022-07-10 LAB — BASIC METABOLIC PANEL
Anion gap: 11 (ref 5–15)
BUN: 12 mg/dL (ref 8–23)
CO2: 26 mmol/L (ref 22–32)
Calcium: 8.5 mg/dL — ABNORMAL LOW (ref 8.9–10.3)
Chloride: 104 mmol/L (ref 98–111)
Creatinine, Ser: 0.87 mg/dL (ref 0.44–1.00)
GFR, Estimated: >60 mL/min (ref >60)
Glucose, Bld: 100 mg/dL — ABNORMAL HIGH (ref 70–99)
Potassium: 4 mmol/L (ref 3.5–5.1)
Sodium: 141 mmol/L (ref 135–145)

## 2022-07-10 LAB — CBC
HCT: 32.5 % — ABNORMAL LOW (ref 36.0–46.0)
Hemoglobin: 10.4 g/dL — ABNORMAL LOW (ref 12.0–15.0)
MCH: 32.1 pg (ref 26.0–34.0)
MCHC: 32 g/dL (ref 30.0–36.0)
MCV: 100.3 fL — ABNORMAL HIGH (ref 80.0–100.0)
Platelets: 473 10*3/uL — ABNORMAL HIGH (ref 150–400)
RBC: 3.24 MIL/uL — ABNORMAL LOW (ref 3.87–5.11)
RDW: 14.9 % (ref 11.5–15.5)
WBC: 10.8 10*3/uL — ABNORMAL HIGH (ref 4.0–10.5)
nRBC: 0 % (ref 0.0–0.2)

## 2022-07-10 MED ORDER — GADOBUTROL 1 MMOL/ML IV SOLN: 10.0000 mL | Freq: Once | INTRAVENOUS | Status: DC | PRN

## 2022-07-10 NOTE — NC FL2 (Signed)
[place] LEVEL OF CARE SCREENING TOOL     IDENTIFICATION  Patient Name: Danielle Dixon Birthdate: 22-Jun-1941 Sex: female Admission Date (Current Location): 07/06/2022  [facility] [place] and [place] Number:  Herbalist and Address:  The [facility], [place] [address], [place], [place] [address]      Provider Number: [id number]  Attending Physician Name and Address:  Tawni Millers  Relative Name and Phone Number:       Current Level of Care: Hospital Recommended Level of Care: [place] Prior Approval Number:    Date Approved/Denied:   PASRR Number: [id number]  Discharge Plan: SNF    Current Diagnoses: Patient Active Problem List   Diagnosis Date Noted   Obesity (BMI 30-39.9) 07/09/2022   Essential hypertension 07/09/2022   Acute kidney injury superimposed on chronic kidney disease ([place]) 07/07/2022   Renal calculi 09/18/2019   Pressure injury of skin 07/14/2019   Pyohydronephrosis 07/10/2019   Sepsis ([place]) 62/83/1517   Acute metabolic encephalopathy 61/60/7371   Rheumatoid arthritis ([place]) 07/10/2019   Leukocytosis, unspecified 06/25/2013   Dyspnea 06/25/2013   Thrombocytosis 06/25/2013   Leukocytosis, unspecified 06/25/2013   Need for prophylactic vaccination and inoculation against influenza 06/25/2013   Unspecified vitamin D deficiency 06/25/2013    Orientation RESPIRATION BLADDER Height & Weight     Self, Place  Normal Continent Weight: 200 lb (90.7 kg) Height:  '5\' 6"'$  (167.6 cm)  BEHAVIORAL SYMPTOMS/MOOD NEUROLOGICAL BOWEL NUTRITION STATUS      Continent    AMBULATORY STATUS COMMUNICATION OF NEEDS Skin   Limited Assist Verbally Normal                       Personal Care Assistance Level of Assistance  Bathing, Dressing, Feeding Bathing Assistance: Limited assistance Feeding assistance: Limited assistance Dressing Assistance: Limited assistance     Functional Limitations Info  Sight,  Hearing, Speech Sight Info: Adequate Hearing Info: Adequate Speech Info: Adequate    SPECIAL CARE FACTORS FREQUENCY  PT (By licensed PT), OT (By licensed OT)                    Contractures Contractures Info: Not present    Additional Factors Info  Code Status Code Status Info: FULL CODE             Current Medications (07/10/2022):  This is the current hospital active medication list Current Facility-Administered Medications  Medication Dose Route Frequency Provider Last Rate Last Admin   acetaminophen (TYLENOL) tablet 650 mg  650 mg Oral Q6H PRN Etta Quill, DO       Or   acetaminophen (TYLENOL) suppository 650 mg  650 mg Rectal Q6H PRN Etta Quill, DO       albuterol (PROVENTIL) (2.5 MG/3ML) 0.083% nebulizer solution 2.5 mg  2.5 mg Nebulization Q4H PRN Louanne Belton, RPH       amLODipine (NORVASC) tablet 5 mg  5 mg Oral Daily Arrien, Jimmy Picket, MD   5 mg at 07/10/22 1014   ampicillin (OMNIPEN) 2 g in sodium chloride 0.9 % 100 mL IVPB  2 g Intravenous Q8H Tawni Millers, MD 300 mL/hr at 07/10/22 1404 2 g at 07/10/22 1404   Chlorhexidine Gluconate Cloth 2 % PADS 6 each  6 each Topical Daily Carlyle Lipa, MD   6 each at 07/10/22 1017   docusate sodium (COLACE) capsule 100 mg  100 mg Oral q AM Arrien, Jimmy Picket,  MD   100 mg at 07/10/22 1014   enoxaparin (LOVENOX) injection 40 mg  40 mg Subcutaneous Q24H Jennette Kettle M, DO   40 mg at 18/29/93 7169   folic acid (FOLVITE) tablet 1 mg  1 mg Oral Once per day on Sun Mon Tue Thu Fri Sat Arrien, Jimmy Picket, MD   1 mg at 07/10/22 1356   gadobutrol (GADAVIST) 1 MMOL/ML injection 10 mL  10 mL Intravenous Once PRN Arrien, Jimmy Picket, MD       loratadine (CLARITIN) tablet 10 mg  10 mg Oral Daily Arrien, Jimmy Picket, MD   10 mg at 07/10/22 1014   ondansetron (ZOFRAN) tablet 4 mg  4 mg Oral Q6H PRN Etta Quill, DO       Or   ondansetron [facility]) injection 4 mg  4 mg Intravenous  Q6H PRN Etta Quill, DO       pantoprazole (PROTONIX) EC tablet 40 mg  40 mg Oral Daily Arrien, Jimmy Picket, MD   40 mg at 07/10/22 1014   PARoxetine (PAXIL) tablet 30 mg  30 mg Oral Daily Arrien, Jimmy Picket, MD   30 mg at 07/10/22 1014     Discharge Medications: Please see discharge summary for a list of discharge medications.  Relevant Imaging Results:  Relevant Lab Results:   Additional Information SS# [id number]  [name], [place]

## 2022-07-10 NOTE — Evaluation (Signed)
Occupational Therapy Evaluation Patient Details Name: Danielle Dixon MRN: [id number] DOB: 28-Jan-1941 Today's Date: 07/10/2022   History of Present Illness The pt is an 81 yo female presenting 10/25 after a fall at independent living facility on 10/15 after which she was on the ground overnight, reports progressive decline in mobility and cognition after that event. Pt found to have UTI with obstructing ureteral calculus and acute metabolic encephalopathy. PMH includes: Alzheimer's dementia, RA, frequent UTI, COPD, HTN, and HTN.   Clinical Impression   81 yo female previously indep with mobility and ADLs using RW admitted after fall. She is unable to answer orientation questions in regards to date, time, or place but is aware of reason for admission as well as answering information about herself. She reports she manages medications without assist and does all ADLs but will not get in shower without someone being at her apartment with her. She does receive assist for housework and laundry. Assessment limited this date as pt refusing OOB mobility stating "I just don't want to." She denied having been OOB since admission despite PT eval previous day and note indicating she was able to ambulate with RW. Reinforced importance of OOB to resume PLOF and improve strength. Will continue to follow acutely.      Recommendations for follow up therapy are one component of a multi-disciplinary discharge planning process, led by the attending physician.  Recommendations may be updated based on patient status, additional functional criteria and insurance authorization.   Follow Up Recommendations  Skilled nursing-short term rehab (<3 hours/day)    Assistance Recommended at Discharge Frequent or constant Supervision/Assistance  Patient can return home with the following A lot of help with walking and/or transfers;A lot of help with bathing/dressing/bathroom;Direct supervision/assist for medications  management;Assist for transportation    Functional Status Assessment  Patient has had a recent decline in their functional status and/or demonstrates limited ability to make significant improvements in function in a reasonable and predictable amount of time  Equipment Recommendations  None recommended by OT       Precautions / Restrictions Precautions Precautions: Fall Restrictions Weight Bearing Restrictions: No      Mobility Bed Mobility Overal bed mobility: Needs Assistance             General bed mobility comments:  (pt refusing OOB mobility, transfer to EOB or assist with supine scoot to improve overall positioning in bed (feet on foot board))    Transfers Overall transfer level: Needs assistance                ADL either performed or assessed with clinical judgement   ADL Overall ADL's : Needs assistance/impaired Eating/Feeding: Modified independent   Grooming: Set up   Upper Body Bathing: Minimal assistance;Bed level   Lower Body Bathing: Maximal assistance;Bed level   Upper Body Dressing : Minimal assistance;Bed level   Lower Body Dressing: Maximal assistance;Bed level               Functional mobility during ADLs:  (pt refusing mobility)       Vision Baseline Vision/History: 0 No visual deficits Ability to See in Adequate Light: 0 Adequate Patient Visual Report: No change from baseline              Pertinent Vitals/Pain Pain Assessment Pain Assessment: No/denies pain     Hand Dominance Right   Extremity/Trunk Assessment Upper Extremity Assessment Upper Extremity Assessment: Overall WFL for tasks assessed (does have arthritic deformities to little finger of both hands  and thumb of R hand)           Communication Communication Communication: No difficulties   Cognition Arousal/Alertness: Awake/alert Behavior During Therapy: WFL for tasks assessed/performed Overall Cognitive Status: Impaired/Different from baseline Area  of Impairment: Orientation, Safety/judgement                 Orientation Level: Place, Time       Safety/Judgement: Decreased awareness of deficits     General Comments: hx of dementia, unsure of baseline, oriented to self and situation but not date and place                Home Living Family/patient expects to be discharged to:: Private residence (indep living facility) Living Arrangements: Alone Available Help at Discharge: Family;Available PRN/intermittently Type of Home: Apartment Home Access: Level entry     Home Layout: One level     Bathroom Shower/Tub: Occupational psychologist: Handicapped height     Home Equipment: Conservation officer, nature (2 wheels);Shower seat;Grab bars - toilet;Grab bars - tub/shower   Additional Comments: reports indep management of medications at baseline      Prior Functioning/Environment Prior Level of Function : Independent/Modified Independent             Mobility Comments: reports using RW at baseline ADLs Comments: reports completing ADLs without assist but will make sure someone is there with her if she gets into the shower        OT Problem List: Decreased strength;Decreased activity tolerance;Impaired balance (sitting and/or standing);Decreased safety awareness      OT Treatment/Interventions: Self-care/ADL training;Therapeutic activities;Patient/family education;Cognitive remediation/compensation    OT Goals(Current goals can be found in the care plan section) Acute Rehab OT Goals Patient Stated Goal: Pt wants to go home and get back to baseline OT Goal Formulation: With patient Time For Goal Achievement: 07/23/22 Potential to Achieve Goals: Fair ADL Goals Pt Will Perform Upper Body Bathing: with min assist;sitting Pt Will Perform Upper Body Dressing: with min guard assist;sitting Pt Will Perform Lower Body Dressing: with min assist;sit to/from stand Pt Will Transfer to Toilet: bedside commode;with min  assist  OT Frequency: Min 2X/week       AM-PAC OT "6 Clicks" Daily Activity     Outcome Measure Help from another person eating meals?: A Little Help from another person taking care of personal grooming?: A Little Help from another person toileting, which includes using toliet, bedpan, or urinal?: Total Help from another person bathing (including washing, rinsing, drying)?: Total Help from another person to put on and taking off regular upper body clothing?: A Lot Help from another person to put on and taking off regular lower body clothing?: Total 6 Click Score: 11   End of Session    Activity Tolerance: Other (comment) (pt self limiting with participation in evaluation, reports "I just don't want to") Patient left: in bed;with call bell/phone within reach  OT Visit Diagnosis: Muscle weakness (generalized) (M62.81);History of falling (Z91.81);Other symptoms and signs involving cognitive function                Time: 1308-6578 OT Time Calculation (min): 10 min Charges:  OT General Charges $OT Visit: 1 Visit OT Evaluation $OT Eval Low Complexity: [employer], OTR/L 07/10/2022, 3:31 PM

## 2022-07-10 NOTE — Progress Notes (Addendum)
Progress Note   Patient: Danielle Dixon ZWC:[id number] DOB: September 29, 1940 DOA: 07/06/2022     3 DOS: the patient was seen and examined on 07/10/2022   Brief hospital course: Mrs. Reeg was admitted to the hospital with the working diagnosis of pyohydronephrosis.   81 yo female with the past medical history of renal stones, dementia, hypertension and rheumatoid arthritis who presented after a mechanical fall. Patient sustained a fall about 7 days, EMS was called and she declined to be transported to the ED. She had difficulty ambulating, and progressive decline in her overall physical functional status. She developed worsening confusion and was brought to the hospital for further evaluation.  In 2020 patient had pyohydronephrosis and obstructing stone, required nephrolithotomy per urology. On her initial physical examination her blood pressure was 138/51, HR 69, RR 32 and 02 saturation 96%, lungs with no wheezing or rales, heart with S1 and S2 present and rhythmic with no gallops, abdomen with no distention, diffuse tender to palpation, no lower extremity edema.   Na 138, K 4,2 CL 100, bicarbonate 24, glucose 154 bun 24 cr 1,31  CK 17  Wbc 20,5 hgb 12.4 plt 510 Head CT with no acute changes.  Sars covid 19 negative  Urine analysis with SG 1,014, protein 100, > 50 wbc, 21-50 rbc  Urine culture enterococcus fecalis.   CT abdomen and pelvis with adjacent 2 mm ureteral calculi within the distal left ureter, with marked severity left sided hydronephrosis and hydroureter. Cluster of sub centimeter non obstructing right renal calculi.  Stable mild hyperdense soft tissue adjacent to the inferior lateral aspect of the urinary bladder on the left.   Renal US with mild left hydronephrosis. 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   Chest radiograph with no cardiomegaly and no infiltrates.   EKG 73 bpm, normal axis, normal intervals, sinus rhythm with no significant ST  segment or T wave changes.   Patient was placed on IV fluids and IV antibiotic therapy.  10/26 cystoscopy with left retrograde pyelogram, left ureteroscopy, left ureteral stent placement.   10/28 pelvic MRI with well define homogeneous lesion in the left pelvis, adjacent to the bladder. Looks extramural, has been stable and probably benign in nature. Plan to follow up as outpatient.  10/29 patient very weak and deconditioned, patient lives alone at home. Physical therapy has recommended transfer to SNF to continue therapy.    Assessment and Plan: * Pyohydronephrosis Complicated with sepsis, present on admission.   Urine culture positive for enterococcus fecalis, sensitive to ampicillin.  Patient has been afebrile and wbc is down to 10 from 20.   Will transition to oral antibiotic therapy with IV Ampicillin  Continue to follow up cell count and temperature curve Plan to follow up as outpatient with urology.   Acute metabolic encephalopathy Delirium secondary to pyohydronephrosis  Ammonia level is 17 and 20   Her mentation has improved with supportive medical therapy, including IV fluids and IV antibiotic therapy. Patient is very deconditioned, will need SNF at the time of her discharge.  Continue paroxetine.   Acute kidney injury superimposed on chronic kidney disease ([place]) CKD stage 3a  Renal function with improvement in serum cr at 0,87, K is 4.0 and serum bicarbonate at 26. Plan to continue close follow up on renal function and electrolytes.   Rheumatoid arthritis ([place]) Hold on methotrexate for now.   Essential hypertension Continue blood pressure control with amlodipine. Systolic blood pressure  235 to 111 mmHg  Obesity (BMI  30-39.9) Calculated BMI 32.2         Subjective: Patient with no chest pain or dyspnea, tolerating po well, continue very weak and deconditioned   Physical Exam: Vitals:   07/09/22 2050 07/10/22 0050 07/10/22 1012 07/10/22 1400  BP:  126/82 (!) 157/66 (!) 142/58 (!) 111/98  Pulse: 68 64 67 76  Resp: '20 18 17 18  '$ Temp: 97.7 F (36.5 C) 97.6 F (36.4 C) 98.1 F (36.7 C) 97.7 F (36.5 C)  TempSrc: Oral Oral Oral Oral  SpO2: 96% 99% 97% 99%  Weight:      Height:       Neurology awake and alert ENT with mild pallor Cardiovascular with S1 and S2 present and rhythmic with no gallops or murmurs Respiratory with no rales or wheezing Abdomen with no distention No lower extremity edema  Data Reviewed:    Family Communication: no family at the bedside   Disposition: Status is: Inpatient Remains inpatient appropriate because: recovering from sepsis, will need SNF placement   Planned Discharge Destination: Skilled nursing facility      Author: Tawni Millers, MD 07/10/2022 3:23 PM  For on call review [email or web address].

## 2022-07-10 NOTE — TOC Initial Note (Signed)
Transition of Care [facility]) - Initial/Assessment Note    Patient Details  Name: Danielle Dixon MRN: [id number] Date of Birth: Oct 09, 1940  Transition of [facility]) CM/SW Contact:    Bary Castilla, LCSW Phone Number: 07/10/2022, 4:10 PM  Clinical Narrative:                  Pt's legal guardian [name] was notified of recommendation of a SNF and agreeable for referral to be faxed out and will make final decision about SNF once bed offers come back. [name] verified that pt was from [place] and had a caregiver that comes 5 days a week for 4 hours each day.   TOC team will continue to assist with discharge planning needs.   Expected Discharge Plan: Skilled Nursing Facility Barriers to Discharge: Continued Medical Work up, SNF Pending bed offer   Patient Goals and CMS Choice        Expected Discharge Plan and Services Expected Discharge Plan: [place] arrangements for the past 2 months: [place]                                      Prior Living Arrangements/Services Living arrangements for the past 2 months: [place] Lives with:: Self Patient language and need for interpreter reviewed:: Yes          Care giver support system in place?: Yes (comment)      Activities of Daily Living      Permission Sought/Granted      Share Information with NAME: [name] legal guardian  Permission granted to share info w AGENCY: SNF  Permission granted to share info w Relationship: Legal guardian sister  Permission granted to share info w Contact Information: [phone]  Emotional Assessment Appearance:: Other (Comment Required (called caregiver)   Affect (typically observed): Unable to Assess Orientation: : Oriented to Self, Oriented to Place, Fluctuating Orientation (Suspected and/or reported [place])      Admission diagnosis:  Ureteral stone with hydronephrosis [N13.2] Pyohydronephrosis  [N13.6] Altered mental status, unspecified altered mental status type [R41.82] Urinary tract infection in female [N39.0] Patient Active Problem List   Diagnosis Date Noted   Obesity (BMI 30-39.9) 07/09/2022   Essential hypertension 07/09/2022   Acute kidney injury superimposed on chronic kidney disease ([place]) 07/07/2022   Renal calculi 09/18/2019   Pressure injury of skin 07/14/2019   Pyohydronephrosis 07/10/2019   Sepsis ([place]) 09/98/3382   Acute metabolic encephalopathy 50/53/9767   Rheumatoid arthritis ([place]) 07/10/2019   Leukocytosis, unspecified 06/25/2013   Dyspnea 06/25/2013   Thrombocytosis 06/25/2013   Leukocytosis, unspecified 06/25/2013   Need for prophylactic vaccination and inoculation against influenza 06/25/2013   Unspecified vitamin D deficiency 06/25/2013   PCP:  Pcp, No Pharmacy:   [facility] [address] [place] - [address] [place] [place] [address] Phone: [phone] Fax: [phone]     Social Determinants of Health (SDOH) Interventions    Readmission Risk Interventions   No data to display

## 2022-07-11 DIAGNOSIS — N136 Pyonephrosis: Secondary | ICD-10-CM | POA: Diagnosis not present

## 2022-07-11 LAB — CBC
HCT: 34.5 % — ABNORMAL LOW (ref 36.0–46.0)
Hemoglobin: 10.7 g/dL — ABNORMAL LOW (ref 12.0–15.0)
MCH: 31.5 pg (ref 26.0–34.0)
MCHC: 31 g/dL (ref 30.0–36.0)
MCV: 101.5 fL — ABNORMAL HIGH (ref 80.0–100.0)
Platelets: 481 10*3/uL — ABNORMAL HIGH (ref 150–400)
RBC: 3.4 MIL/uL — ABNORMAL LOW (ref 3.87–5.11)
RDW: 14.8 % (ref 11.5–15.5)
WBC: 11.1 10*3/uL — ABNORMAL HIGH (ref 4.0–10.5)
nRBC: 0 % (ref 0.0–0.2)

## 2022-07-11 LAB — BASIC METABOLIC PANEL
Anion gap: 8 (ref 5–15)
BUN: 9 mg/dL (ref 8–23)
CO2: 27 mmol/L (ref 22–32)
Calcium: 8.5 mg/dL — ABNORMAL LOW (ref 8.9–10.3)
Chloride: 105 mmol/L (ref 98–111)
Creatinine, Ser: 0.75 mg/dL (ref 0.44–1.00)
GFR, Estimated: >60 mL/min (ref >60)
Glucose, Bld: 113 mg/dL — ABNORMAL HIGH (ref 70–99)
Potassium: 3.9 mmol/L (ref 3.5–5.1)
Sodium: 140 mmol/L (ref 135–145)

## 2022-07-11 NOTE — Progress Notes (Signed)
PROGRESS NOTE    Danielle Dixon  SWN:[id number] DOB: 05-May-1941 DOA: 07/06/2022 PCP: Pcp, No   Brief Narrative: 81 year old with past medical history significant for renal stone, dementia, hypertension and rheumatoid arthritis who presented after a mechanical fall.  Patient sustained a fall about 7 days, EMS was called and she declined to be transported to the ED.  She had difficulty ambulating and progressive decline in her overall physical functional status.  She developed worsening confusion and was brought to the hospital for further evaluation.  CT abdomen and pelvis with adjacent 2 mm ureteral calculi with a distal left ureter, with marked severity left side hydronephrosis and hydroureter.  Cluster of subcentimeter nonobstructing right renal calculi.  Stable mild hyperdense soft tissue adjacent to the inferior lateral aspect of the urinary bladder on the left.  Renal ultrasound with mild left hydronephrosis, right side bladder diverticulum with dependent debris's in the bladder.  Patient was admitted received IV fluids, IV antibiotics on 10/26 underwent cystoscopy with left retrograde pyelogram, left ureteroscopy and left ureteral stent placement. 10/28 pelvic MRI show well defined, he needs lesion in the left pelvis, adjacent to the bladder.  Looks extra more out, has been stable and probably benign in nature.  This need to be follow-up as an outpatient.  Patient has been very weak and deconditioned and required rehab.  Plan to transfer to rehab 10/31 when  facility will accept patient   Assessment & Plan:   Principal Problem:   Pyohydronephrosis Active Problems:   Acute metabolic encephalopathy   Acute kidney injury superimposed on chronic kidney disease (HCC)   Rheumatoid arthritis ([place])   Essential hypertension   Obesity (BMI 30-39.9)  1-Pyohydronephrosis: Left distal ureteral stone/questionable stricture: Status post left JJ stent 10/26 Obstructing  pyelonephritis Sepsis  secondary to bilateral hydronephrosis Urine culture grew Enterococcus faecalis sensitive to ampicillin Leukocytosis improved Continue with ampicillin. Patient will need to follow-up with Dr. Gloriann Loan to discuss stone management in elective setting.  2-acute metabolic encephalopathy Secondary to delirium and infection Ammonia level 17 Mental status has improved.  She will benefit from rehab Continue with paroxetine  AKI on CKD stage IIIa: Improved with hydration and treatment of obstructive pyelonephritis  Rheumatoid arthritis: Holding methotrexate, resume outpatient   Hypertension: Continue with amlodipine Obesity BMI 32: Need lifestyle modification   Estimated body mass index is 32.28 kg/m as calculated from the following:   Height as of this encounter: '5\' 6"'$  (1.676 m).   Weight as of this encounter: 90.7 kg.   DVT prophylaxis: Lovenox Code Status: Full code Family Communication: Discussed with patient Disposition Plan:  Status is: Inpatient Remains inpatient appropriate because: Plan to rehab tomorrow, when bed will be available    Consultants:  Urology   Procedures:  Cystoscopy   Antimicrobials:  Ampicillin   Subjective: Alert, conversant. Denies pain   Objective: Vitals:   07/11/22 0841 07/11/22 0842 07/11/22 1238 07/11/22 1534  BP: (!) 122/96 (!) 122/96 132/64 102/85  Pulse: 69 70 70 72  Resp: '15 19 18 18  '$ Temp: 97.8 F (36.6 C) 98.2 F (36.8 C) 98 F (36.7 C) (!) 97.5 F (36.4 C)  TempSrc: Oral Oral Oral Oral  SpO2: 99% 97% 95% 95%  Weight:      Height:        Intake/Output Summary (Last 24 hours) at 07/11/2022 1555 Last data filed at 07/11/2022 1200 Gross per 24 hour  Intake 924 ml  Output 1450 ml  Net -526 ml   [employer]  07/07/22 0035  Weight: 90.7 kg    Examination:  General exam: Appears calm and comfortable  Respiratory system: Clear to auscultation. Respiratory effort normal. Cardiovascular system: S1 & S2 heard, RRR. No  JVD, murmurs, rubs, gallops or clicks. No pedal edema. Gastrointestinal system: Abdomen is nondistended, soft and nontender. No organomegaly or masses felt. Normal bowel sounds heard. Central nervous system: Alert and oriented Extremities: Symmetric 5 x 5 power.    Data Reviewed: I have personally reviewed following labs and imaging studies  CBC: Recent Labs  Lab 07/06/22 1203 07/10/22 0055 07/11/22 0052  WBC 20.5* 10.8* 11.1*  NEUTROABS 17.0*  --   --   HGB 12.4 10.4* 10.7*  HCT 39.3 32.5* 34.5*  MCV 101.3* 100.3* 101.5*  PLT 510* 473* 250*   Basic Metabolic Panel: Recent Labs  Lab 07/06/22 1203 07/10/22 0055 07/11/22 0052  NA 138 141 140  K 4.1 4.0 3.9  CL 100 104 105  CO2 '24 26 27  '$ GLUCOSE 154* 100* 113*  BUN 24* 12 9  CREATININE 1.31* 0.87 0.75  CALCIUM 8.9 8.5* 8.5*   GFR: Estimated Creatinine Clearance: 62.6 mL/min (by C-G formula based on SCr of 0.75 mg/dL). Liver Function Tests: Recent Labs  Lab 07/06/22 1203  AST 33  ALT 62*  ALKPHOS 130*  BILITOT 1.2  PROT 6.5  ALBUMIN 2.3*   No results for input(s): "LIPASE", "AMYLASE" in the last 168 hours. Recent Labs  Lab 07/06/22 2258 07/07/22 0640  AMMONIA 17 20   Coagulation Profile: Recent Labs  Lab 07/07/22 0001  INR 1.2   Cardiac Enzymes: Recent Labs  Lab 07/06/22 1203  CKTOTAL 17*   BNP (last 3 results) No results for input(s): "PROBNP" in the last 8760 hours. HbA1C: No results for input(s): "HGBA1C" in the last 72 hours. CBG: No results for input(s): "GLUCAP" in the last 168 hours. Lipid Profile: No results for input(s): "CHOL", "HDL", "LDLCALC", "TRIG", "CHOLHDL", "LDLDIRECT" in the last 72 hours. Thyroid Function Tests: No results for input(s): "TSH", "T4TOTAL", "FREET4", "T3FREE", "THYROIDAB" in the last 72 hours. Anemia Panel: No results for input(s): "VITAMINB12", "FOLATE", "FERRITIN", "TIBC", "IRON", "RETICCTPCT" in the last 72 hours. Sepsis Labs: Recent Labs  Lab  07/06/22 2317  LATICACIDVEN 1.4    Recent Results (from the past 240 hour(s))  Urine Culture     Status: Abnormal   Collection Time: 07/06/22 11:54 PM   Specimen: In/Out Cath Urine  Result Value Ref Range Status   Specimen Description IN/OUT CATH URINE  Final   Special Requests   Final    NONE Performed at [facility], [address] [place], [place] [address]    Culture >=100,000 COLONIES/mL ENTEROCOCCUS FAECALIS (A)  Final   Report Status 07/09/2022 FINAL  Final   Organism ID, Bacteria ENTEROCOCCUS FAECALIS (A)  Final      Susceptibility   Enterococcus faecalis - MIC*    AMPICILLIN <=2 SENSITIVE Sensitive     NITROFURANTOIN <=16 SENSITIVE Sensitive     VANCOMYCIN 1 SENSITIVE Sensitive     * >=100,000 COLONIES/mL ENTEROCOCCUS FAECALIS  Culture, blood (routine x 2)     Status: None (Preliminary result)   Collection Time: 07/07/22 12:01 AM   Specimen: BLOOD  Result Value Ref Range Status   Specimen Description BLOOD SITE NOT SPECIFIED  Final   Special Requests   Final    BOTTLES DRAWN AEROBIC AND ANAEROBIC Blood Culture adequate volume   Culture   Final    NO GROWTH 4  DAYS Performed at [facility], [place] [address] [place], [place] [address]    Report Status PENDING  Incomplete  Culture, blood (routine x 2)     Status: None (Preliminary result)   Collection Time: 07/07/22 12:01 AM   Specimen: BLOOD  Result Value Ref Range Status   Specimen Description BLOOD SITE NOT SPECIFIED  Final   Special Requests   Final    BOTTLES DRAWN AEROBIC AND ANAEROBIC Blood Culture adequate volume   Culture   Final    NO GROWTH 4 DAYS Performed at [facility], [place] [address], [place], [place] [address]    Report Status PENDING  Incomplete  Resp Panel by RT-PCR (Flu A&B, Covid) Anterior Nasal Swab     Status: None   Collection Time: 07/07/22 12:20 AM   Specimen: Anterior Nasal Swab  Result Value Ref Range Status   SARS Coronavirus 2 by RT PCR NEGATIVE NEGATIVE Final     Comment: (NOTE) SARS-CoV-2 target nucleic acids are NOT DETECTED.  The SARS-CoV-2 RNA is generally detectable in upper respiratory specimens during the acute phase of infection. The lowest concentration of SARS-CoV-2 viral copies this assay can detect is 138 copies/mL. A negative result does not preclude SARS-Cov-2 infection and should not be used as the sole basis for treatment or other patient management decisions. A negative result may occur with  improper specimen collection/handling, submission of specimen other than nasopharyngeal swab, presence of viral mutation(s) within the areas targeted by this assay, and inadequate number of viral copies(<138 copies/mL). A negative result must be combined with clinical observations, patient history, and epidemiological information. The expected result is Negative.  Fact Sheet for Patients:  EntrepreneurPulse.com.au  Fact Sheet for Healthcare Providers:  IncredibleEmployment.be  This test is no t yet approved or cleared by the [place] FDA and  has been authorized for detection and/or diagnosis of SARS-CoV-2 by FDA under an Emergency Use Authorization (EUA). This EUA will remain  in effect (meaning this test can be used) for the duration of the COVID-19 declaration under Section 564(b)(1) of the Act, 21 U.S.C.section 360bbb-3(b)(1), unless the authorization is terminated  or revoked sooner.       Influenza A by PCR NEGATIVE NEGATIVE Final   Influenza B by PCR NEGATIVE NEGATIVE Final    Comment: (NOTE) The Xpert Xpress SARS-CoV-2/FLU/RSV plus assay is intended as an aid in the diagnosis of influenza from Nasopharyngeal swab specimens and should not be used as a sole basis for treatment. Nasal washings and aspirates are unacceptable for Xpert Xpress SARS-CoV-2/FLU/RSV testing.  Fact Sheet for Patients: EntrepreneurPulse.com.au  Fact Sheet for Healthcare  Providers: IncredibleEmployment.be  This test is not yet approved or cleared by the [place] FDA and has been authorized for detection and/or diagnosis of SARS-CoV-2 by FDA under an Emergency Use Authorization (EUA). This EUA will remain in effect (meaning this test can be used) for the duration of the COVID-19 declaration under Section 564(b)(1) of the Act, 21 U.S.C. section 360bbb-3(b)(1), unless the authorization is terminated or revoked.  Performed at [facility], [place] [address], [place], [place] [address]          Radiology Studies: MR PELVIS WO CONTRAST  Result Date: 07/10/2022 CLINICAL DATA:  Soft tissue lesion in the left pelvis adjacent to the bladder. EXAM: MRI PELVIS WITHOUT CONTRAST TECHNIQUE: Multiplanar multisequence MR imaging of the pelvis was performed. No intravenous contrast was administered. COMPARISON:  CT abdomen/pelvis 07/07/2022 and 07/10/2019. FINDINGS: Urinary Tract:  Posterolateral right-sided bladder diverticulum evident. No evidence for urethral diverticulum. 2.6 x 1.6 x 2.3 cm well-defined homogeneous lesion with intermediate signal intensity on T1 imaging and low signal intensity on T2 and is identified in the left pelvis, immediately adjacent to the urinary bladder (see axial T2 weighted image 18 of series 3). Although no discrete fat plane persists between the bladder wall and this lesion, coronal imaging (24/2) suggests that this is extramural, immediately adjacent to the bladder wall. Adventitial lesion could be a possibility. Imaging features are not suggestive of an intramural or mucosal lesion. The lesion is discrete from the left ovary. The patient declined intravenous contrast which limits assessment of the nodule itself. Left internal ureteral stent evident with mild fullness of the left intrarenal collecting system and left ureter. Bowel:  Unremarkable visualized pelvic bowel loops. Vascular/Lymphatic: No vascular  abnormality evident on noncontrast imaging. No pelvic lymphadenopathy. Reproductive: Uterus measures 7.0 x 3.4 x 7.5 cm with multiple fibroids evident. Dominant fibroid is an exophytic right fundal lesion measuring up to 4.1 cm. Left ovary measures 2.2 x 1.3 x 1.5 cm without mass lesion. Right ovary is not discretely visible. Other:  No intraperitoneal free fluid. Musculoskeletal: No suspicious marrow signal abnormality. IMPRESSION: 1. 2.6 x 1.6 x 2.3 cm well-defined homogeneous lesion in the left pelvis, immediately adjacent to the urinary bladder. Although no discrete fat plane persists between the bladder wall and this lesion, coronal imaging suggests that this lesion is extramural, immediately adjacent to the bladder wall. Adventitial lesion could be a possibility. Imaging features are not suggestive of an intramural or mucosal lesion. The patient declined intravenous contrast which limits assessment of the nodule itself. Of note, this lesion was present on the CT scan from 07/10/2019 and measured 2.3 x 1.3 x 2.3 cm at that time suggesting minimal if any change. This relative stability over 3 years is most suggestive of a benign etiology. Given the presence of the left renal stent, this patient may undergo additional cross-sectional imaging in the future instability of the finding could be confirmed on those exams. If the patient is not likely to undergo additional cross-sectional imaging, repeat MRI in 6-12 months could be used to ensure continued stability. Having the patient completely emptied the bladder prior to scanning may prove helpful to more definitively separate this lesion from the bladder wall if it is truly extramural. 2. No pelvic lymphadenopathy. 3. Fibroid uterus. 4. Posterolateral right-sided bladder diverticulum. Electronically Signed   By: Misty Stanley M.D.   On: 07/10/2022 10:55        Scheduled Meds:  amLODipine  5 mg Oral Daily   Chlorhexidine Gluconate Cloth  6 each Topical Daily    docusate sodium  100 mg Oral q AM   enoxaparin (LOVENOX) injection  40 mg Subcutaneous N82N   folic acid  1 mg Oral Once per day on Sun Mon Tue Thu Fri Sat   loratadine  10 mg Oral Daily   pantoprazole  40 mg Oral Daily   PARoxetine  30 mg Oral Daily   Continuous Infusions:  ampicillin (OMNIPEN) IV 2 g (07/11/22 1243)     LOS: 4 days    Time spent: 35 minutes    Cloys Vera A Kirti Carl, MD [facility]   If 7PM-7AM, please contact night-coverage [email or web address]  07/11/2022, 3:55 PM

## 2022-07-11 NOTE — Care Management Important Message (Signed)
Important Message  Patient Details  Name: Danielle Dixon MRN: [id number] Date of Birth: 1941-03-17   Medicare Important Message Given:  Yes     Shelda Altes 07/11/2022, 10:33 AM

## 2022-07-11 NOTE — TOC Progression Note (Signed)
Transition of Care [facility]) - Progression Note    Patient Details  Name: Danielle Dixon MRN: [id number] Date of Birth: [birth year]  Transition of Care [facility]) CM/SW [place], [place] Phone Number: 07/11/2022, 2:55 PM  Clinical Narrative:     [facility] advised they can admit patient tomorrow if stable.   MD updated   TOC will continue to follow and assist with discharge planning.  Thurmond Butts, MSW, LCSW Clinical Social Worker    Expected Discharge Plan: Skilled Nursing Facility Barriers to Discharge:  [facility] can admit tomorrow if stable)  Expected Discharge Plan and Services Expected Discharge Plan: [place] arrangements for the past 2 months: [place]                                       Social Determinants of Health (SDOH) Interventions    Readmission Risk Interventions     No data to display

## 2022-07-11 NOTE — TOC Progression Note (Signed)
Transition of Care [facility]) - Progression Note    Patient Details  Name: Danielle Dixon MRN: [id number] Date of Birth: 07/14/1941  Transition of Care [facility]) CM/SW [place],  Phone Number: 07/11/2022, 11:27 AM  Clinical Narrative:     Spoke with patient's sister [name]- informed of bed offers- preferred SNF is [facility].  [facility] confirmed bed offer.   TOC will continue to follow and assist with discharge planning.  Thurmond Butts, MSW, LCSW Clinical Social Worker    Expected Discharge Plan: Skilled Nursing Facility Barriers to Discharge: Continued Medical Work up, SNF Pending bed offer  Expected Discharge Plan and Services Expected Discharge Plan: [place] arrangements for the past 2 months: [place]                                       Social Determinants of Health (SDOH) Interventions    Readmission Risk Interventions     No data to display

## 2022-07-12 ENCOUNTER — Other Ambulatory Visit: Payer: Self-pay | Admitting: *Deleted

## 2022-07-12 DIAGNOSIS — Z7401 Bed confinement status: Secondary | ICD-10-CM | POA: Diagnosis not present

## 2022-07-12 DIAGNOSIS — M069 Rheumatoid arthritis, unspecified: Secondary | ICD-10-CM | POA: Diagnosis not present

## 2022-07-12 DIAGNOSIS — N390 Urinary tract infection, site not specified: Secondary | ICD-10-CM | POA: Diagnosis not present

## 2022-07-12 DIAGNOSIS — E119 Type 2 diabetes mellitus without complications: Secondary | ICD-10-CM | POA: Diagnosis not present

## 2022-07-12 DIAGNOSIS — N132 Hydronephrosis with renal and ureteral calculous obstruction: Secondary | ICD-10-CM | POA: Diagnosis not present

## 2022-07-12 DIAGNOSIS — N136 Pyonephrosis: Secondary | ICD-10-CM | POA: Diagnosis not present

## 2022-07-12 DIAGNOSIS — M6259 Muscle wasting and atrophy, not elsewhere classified, multiple sites: Secondary | ICD-10-CM | POA: Diagnosis not present

## 2022-07-12 DIAGNOSIS — Z741 Need for assistance with personal care: Secondary | ICD-10-CM | POA: Diagnosis not present

## 2022-07-12 DIAGNOSIS — R41841 Cognitive communication deficit: Secondary | ICD-10-CM | POA: Diagnosis not present

## 2022-07-12 DIAGNOSIS — R2681 Unsteadiness on feet: Secondary | ICD-10-CM | POA: Diagnosis not present

## 2022-07-12 DIAGNOSIS — R404 Transient alteration of awareness: Secondary | ICD-10-CM | POA: Diagnosis not present

## 2022-07-12 DIAGNOSIS — R4182 Altered mental status, unspecified: Secondary | ICD-10-CM | POA: Diagnosis not present

## 2022-07-12 DIAGNOSIS — G9340 Encephalopathy, unspecified: Secondary | ICD-10-CM | POA: Diagnosis not present

## 2022-07-12 DIAGNOSIS — M6281 Muscle weakness (generalized): Secondary | ICD-10-CM | POA: Diagnosis not present

## 2022-07-12 DIAGNOSIS — E559 Vitamin D deficiency, unspecified: Secondary | ICD-10-CM | POA: Diagnosis not present

## 2022-07-12 DIAGNOSIS — Z9181 History of falling: Secondary | ICD-10-CM | POA: Diagnosis not present

## 2022-07-12 DIAGNOSIS — G9341 Metabolic encephalopathy: Secondary | ICD-10-CM | POA: Diagnosis not present

## 2022-07-12 DIAGNOSIS — N189 Chronic kidney disease, unspecified: Secondary | ICD-10-CM | POA: Diagnosis not present

## 2022-07-12 DIAGNOSIS — D72829 Elevated white blood cell count, unspecified: Secondary | ICD-10-CM | POA: Diagnosis not present

## 2022-07-12 DIAGNOSIS — U071 COVID-19: Secondary | ICD-10-CM | POA: Diagnosis not present

## 2022-07-12 DIAGNOSIS — N179 Acute kidney failure, unspecified: Secondary | ICD-10-CM | POA: Diagnosis not present

## 2022-07-12 DIAGNOSIS — I10 Essential (primary) hypertension: Secondary | ICD-10-CM | POA: Diagnosis not present

## 2022-07-12 DIAGNOSIS — K219 Gastro-esophageal reflux disease without esophagitis: Secondary | ICD-10-CM | POA: Diagnosis not present

## 2022-07-12 LAB — CULTURE, BLOOD (ROUTINE X 2)
Culture: NO GROWTH
Culture: NO GROWTH
Special Requests: ADEQUATE
Special Requests: ADEQUATE

## 2022-07-12 MED ORDER — AMPICILLIN 500 MG PO CAPS: 500.0000 mg | ORAL_CAPSULE | Freq: Four times a day (QID) | ORAL | 0 refills | Status: AC

## 2022-07-12 NOTE — Progress Notes (Addendum)
Mobility Specialist Progress Note:   07/12/22 1223  Mobility  Activity Ambulated with assistance in room  Level of Assistance Minimal assist, patient does 75% or more  Assistive Device Front wheel walker  Distance Ambulated (ft) 24 ft  Activity Response Tolerated well  $Mobility charge 1 Mobility   Pt received in chair willing to participate in mobility. No complaints of pain. Required 1 seated break d/t fatigue. MinA to stand then contact guard throughout. Left in chair with call bell in reach and all needs met.   [facility] Surveyor, mining Chat only

## 2022-07-12 NOTE — TOC Transition Note (Signed)
Transition of Care [facility]) - CM/SW Discharge Note   Patient Details  Name: Danielle Dixon MRN: [id number] Date of Birth: 05/21/1941  Transition of Care [facility]) CM/SW Contact:  Vinie Sill, LCSW Phone Number: 07/12/2022, 11:44 AM   Clinical Narrative:     Patient will Discharge to: [facility] Discharge Date: 07/12/2022 Family Notified: Sister/ Guardian Transport [id number]  Per MD patient is ready for discharge. RN, patient, and facility notified of discharge. Discharge Summary sent to facility. RN given number for [phone]. Ambulance transport requested for patient @ 11:47am.  Clinical Social Worker signing off.  Thurmond Butts, MSW, LCSW Clinical Social Worker     Final next level of care: Skilled Nursing Facility Barriers to Discharge: Barriers Resolved   Patient Goals and CMS Choice        Discharge Placement              Patient chooses bed at: [facility] Patient to be transferred to facility by: [place] Name of family member notified: sister Patient and family notified of of transfer: 07/12/22  Discharge Plan and Services                                     Social Determinants of Health (SDOH) Interventions     Readmission Risk Interventions     No data to display

## 2022-07-12 NOTE — Progress Notes (Signed)
Physical Therapy Treatment Patient Details Name: Danielle Dixon MRN: [id number] DOB: 07/22/1941 Today's Date: 07/12/2022   History of Present Illness The pt is an 81 yo female presenting 10/25 after a fall at independent living facility on 10/15 after which she was on the ground overnight, reports progressive decline in mobility and cognition after that event. Pt found to have UTI with obstructing ureteral calculus and acute metabolic encephalopathy. PMH includes: Alzheimer's dementia, RA, frequent UTI, COPD, HTN, and HTN.    PT Comments    Pt confused, however, is agreeable to participate in therapy session. Pt requiring min-mod assist for functional mobility. Ambulating limited distance from bed to chair with RW and able to complete serial sit to stands from chair for functional strengthening. Presents as a high fall risk based on history of falls and decreased safety awareness. Continue to recommend SNF for ongoing Physical Therapy.      Recommendations for follow up therapy are one component of a multi-disciplinary discharge planning process, led by the attending physician.  Recommendations may be updated based on patient status, additional functional criteria and insurance authorization.  Follow Up Recommendations  Skilled nursing-short term rehab (<3 hours/day) Can patient physically be transported by private vehicle: Yes   Assistance Recommended at Discharge Frequent or constant Supervision/Assistance  Patient can return home with the following A little help with walking and/or transfers;A little help with bathing/dressing/bathroom;Assistance with cooking/housework;Direct supervision/assist for medications management;Direct supervision/assist for financial management;Assist for transportation;Help with stairs or ramp for entrance   Equipment Recommendations  None recommended by PT    Recommendations for Other Services       Precautions / Restrictions Precautions Precautions:  Fall Restrictions Weight Bearing Restrictions: No     Mobility  Bed Mobility Overal bed mobility: Needs Assistance Bed Mobility: Supine to Sit     Supine to sit: Min assist     General bed mobility comments: Pt initiating well, minA for scooting left hip out to edge of bed with use of bed pad    Transfers Overall transfer level: Needs assistance Equipment used: Rolling walker (2 wheels) Transfers: Sit to/from Stand, Bed to chair/wheelchair/BSC Sit to Stand: Min assist, Mod assist   Step pivot transfers: Min assist       General transfer comment: Min-modA for standing from edge of bed and chair. Cues for upright posture and bilateral quad activation. Taking pivotal steps to chair towards right    Ambulation/Gait                   Stairs             Wheelchair Mobility    Modified Rankin (Stroke Patients Only)       Balance Overall balance assessment: Needs assistance Sitting-balance support: No upper extremity supported, Feet supported Sitting balance-Leahy Scale: Good     Standing balance support: Bilateral upper extremity supported, During functional activity Standing balance-Leahy Scale: Poor Standing balance comment: dependent on BUE support                            Cognition Arousal/Alertness: Awake/alert Behavior During Therapy: WFL for tasks assessed/performed Overall Cognitive Status: Impaired/Different from baseline Area of Impairment: Orientation, Safety/judgement                 Orientation Level: Place, Time, Situation       Safety/Judgement: Decreased awareness of deficits     General Comments: hx of dementia, pt A&O to self  only. unable to recall it was October even when cued it was Halloween. poor recall        Exercises Other Exercises Other Exercises: Serial sit to stands x 3    General Comments        Pertinent Vitals/Pain Pain Assessment Pain Assessment: No/denies pain    Home Living                           Prior Function            PT Goals (current goals can now be found in the care plan section) Acute Rehab PT Goals Patient Stated Goal: to eat Potential to Achieve Goals: Fair Progress towards PT goals: Progressing toward goals    Frequency    Min 2X/week      PT Plan Frequency needs to be updated    Co-evaluation              AM-PAC PT "6 Clicks" Mobility   Outcome Measure  Help needed turning from your back to your side while in a flat bed without using bedrails?: A Little Help needed moving from lying on your back to sitting on the side of a flat bed without using bedrails?: A Little Help needed moving to and from a bed to a chair (including a wheelchair)?: A Little Help needed standing up from a chair using your arms (e.g., wheelchair or bedside chair)?: A Little Help needed to walk in hospital room?: A Lot Help needed climbing 3-5 steps with a railing? : A Lot 6 Click Score: 16    End of Session Equipment Utilized During Treatment: Gait belt Activity Tolerance: Patient tolerated treatment well;Patient limited by fatigue Patient left: in chair;with call bell/phone within reach;with chair alarm set Nurse Communication: Mobility status PT Visit Diagnosis: Other abnormalities of gait and mobility (R26.89);Muscle weakness (generalized) (M62.81)     Time: 2426-8341 PT Time Calculation (min) (ACUTE ONLY): 15 min  Charges:  $Therapeutic Activity: 8-22 mins                     Wyona Almas, PT, DPT Acute Rehabilitation Services Office [phone]    Deno Etienne 07/12/2022, 8:42 AM

## 2022-07-12 NOTE — Patient Outreach (Signed)
Per [facility] Ms. Ashenfelter admitted to [facility]. Screening for potential [facility] care coordination services as benefit of insurance plan and PCP.   Secure communication sent to SNF social workers to make aware Probation officer following for transition plans and [facility] needs.   Marthenia Rolling, MSN, RN,BSN [place] Acute Care Coordinator [phone] (Direct dial)

## 2022-07-12 NOTE — Discharge Summary (Addendum)
Physician Discharge Summary   Patient: Danielle Dixon MRN: [id number] DOB: 16-Sep-1940  Admit date:     07/06/2022  Discharge date: 07/12/22  Discharge Physician: Elmarie Shiley   PCP: Pcp, No   Recommendations at discharge:   Needs to follow up with Dr Gloriann Loan, urology for further care of stones management..  Needs resumption on Methotraxate out patient when infection is clear.  Needs follow up of pelvis lesion.   Discharge Diagnoses: Principal Problem:   Pyohydronephrosis Active Problems:   Acute metabolic encephalopathy   Acute kidney injury superimposed on chronic kidney disease (HCC)   Rheumatoid arthritis (HCC)   Essential hypertension   Obesity (BMI 30-39.9)  Resolved Problems:   * No resolved hospital problems. *  Hospital Course: 81 year old with past medical history significant for renal stone, dementia, hypertension and rheumatoid arthritis who presented after a mechanical fall.  Patient sustained a fall about 7 days, EMS was called and she declined to be transported to the ED.  She had difficulty ambulating and progressive decline in her overall physical functional status.  She developed worsening confusion and was brought to the hospital for further evaluation.  CT abdomen and pelvis with adjacent 2 mm ureteral calculi with a distal left ureter, with marked severity left side hydronephrosis and hydroureter.  Cluster of subcentimeter nonobstructing right renal calculi.  Stable mild hyperdense soft tissue adjacent to the inferior lateral aspect of the urinary bladder on the left.  Renal ultrasound with mild left hydronephrosis, right side bladder diverticulum with dependent debris's in the bladder.   Patient was admitted received IV fluids, IV antibiotics on 10/26 underwent cystoscopy with left retrograde pyelogram, left ureteroscopy and left ureteral stent placement. 10/28 pelvic MRI show well defined, he needs lesion in the left pelvis, adjacent to the bladder.  Looks  extra more out, has been stable and probably benign in nature.  This need to be follow-up as an outpatient.   Patient has been very weak and deconditioned and required rehab.  Plan to transfer to rehab 10/31 when facility will be able to accept patient.     Assessment and Plan: 1-Pyohydronephrosis: Left distal ureteral stone/questionable stricture: Status post left JJ stent 10/26 Obstructing  pyelonephritis Sepsis present  on admission, rule in.  Sepsis secondary to bilateral hydronephrosis Urine culture grew Enterococcus faecalis sensitive to ampicillin Leukocytosis improved Continue with ampicillin. She will be discharge on 7 more days of antibiotics. She has received 5 days of IV ampicillin in the hospital.  Patient will need to follow-up with Dr. Gloriann Loan to discuss stone management in elective setting. Stable to transfer to rehab.   2-Acute metabolic encephalopathy Secondary to delirium and infection Ammonia level 17 Mental status has improved.  She will benefit from rehab Continue with paroxetine   AKI on CKD stage IIIa: Improved with hydration and treatment of obstructive pyelonephritis   Rheumatoid arthritis: Holding methotrexate, resume outpatient   2.6 x 1.6 x 2.3 cm well-defined homogeneous lesion in the left pelvis, immediately adjacent to the urinary bladder. Although no discrete fat plane persists between the bladder wall and this lesion, coronal imaging suggests that this lesion is extramural, immediately adjacent to the bladder wall. Adventitial lesion could be a possibility. Imaging features are not suggestive of an intramural or mucosal lesion.  Of note, this lesion was present on the CT scan from 07/10/2019 and measured 2.3 x 1.3 x 2.3 cm at that time suggesting minimal if any change. This relative stability over 3 years is most suggestive of  a benign etiology. -She will need further imagine for Follow up.  -Sister Updated.   Hypertension: Continue with  amlodipine Obesity BMI 32: Need lifestyle modification     Estimated body mass index is 32.28 kg/m as calculated from the following:   Height as of this encounter: '5\' 6"'$  (1.676 m).   Weight as of this encounter: 90.7 kg.         Consultants: Urology  Procedures performed: Cystoscopy  Disposition: Skilled nursing facility Diet recommendation:  Discharge Diet Orders (From admission, onward)     Start     Ordered   07/12/22 0000  Diet - low sodium heart healthy        07/12/22 0849           Regular diet DISCHARGE MEDICATION: Allergies as of 07/12/2022   No Known Allergies      Medication List     STOP taking these medications    HYDROcodone-acetaminophen 5-325 MG tablet Commonly known as: Norco   methotrexate 2.5 MG tablet Commonly known as: RHEUMATREX       TAKE these medications    albuterol 108 (90 Base) MCG/ACT inhaler Commonly known as: VENTOLIN HFA Inhale 2 puffs into the lungs 4 (four) times daily as needed for wheezing or shortness of breath.   amLODipine 5 MG tablet Commonly known as: NORVASC Take 5 mg by mouth daily.   ampicillin 500 MG capsule Commonly known as: PRINCIPEN Take 1 capsule (500 mg total) by mouth 4 (four) times daily for 7 days.   cetirizine 10 MG tablet Commonly known as: ZYRTEC Take 10 mg by mouth daily.   clotrimazole 1 % external solution Commonly known as: LOTRIMIN Apply 1 Application topically See admin instructions. Apply to the scalp daily   docusate sodium 100 MG capsule Commonly known as: COLACE Take 100 mg by mouth in the morning.   folic acid 1 MG tablet Commonly known as: FOLVITE Take 1 mg by mouth See admin instructions. Take 1 mg by mouth once a day on Sun/Mon/Tues/Thurs/Fri/Sat- hold on Wednesdays when taking Methotrexate   omeprazole 20 MG capsule Commonly known as: PRILOSEC Take 20 mg by mouth daily as needed (for acid reflux or gastric issues).   PARoxetine 30 MG tablet Commonly known as:  PAXIL Take 30 mg by mouth daily.   triamcinolone cream 0.5 % Commonly known as: KENALOG Apply 1 Application topically See admin instructions. Apply to affected areas of the skin 2 times a day as needed for psoriasis and a small amount behind both ears        Follow-up Information     Lucas Mallow, MD. Call.   Specialty: Urology Why: Be sure to follow-up with Dr. Gloriann Loan to arrange stent management. Contact information: [place] [address] [phone]         Sheralyn Boatman, MD .   Specialty: Pediatrics Contact information: [place] [phone] [phone]                Discharge Exam: Danley Danker Weights   07/07/22 0035  Weight: 90.7 kg   General; NAD Lungs; CTA  Condition at discharge: stable  The results of significant diagnostics from this hospitalization (including imaging, microbiology, ancillary and laboratory) are listed below for reference.   Imaging Studies: MR PELVIS WO CONTRAST  Result Date: 07/10/2022 CLINICAL DATA:  Soft tissue lesion in the left pelvis adjacent to the bladder. EXAM: MRI PELVIS WITHOUT CONTRAST TECHNIQUE: Multiplanar multisequence MR  imaging of the pelvis was performed. No intravenous contrast was administered. COMPARISON:  CT abdomen/pelvis 07/07/2022 and 07/10/2019. FINDINGS: Urinary Tract: Posterolateral right-sided bladder diverticulum evident. No evidence for urethral diverticulum. 2.6 x 1.6 x 2.3 cm well-defined homogeneous lesion with intermediate signal intensity on T1 imaging and low signal intensity on T2 and is identified in the left pelvis, immediately adjacent to the urinary bladder (see axial T2 weighted image 18 of series 3). Although no discrete fat plane persists between the bladder wall and this lesion, coronal imaging (24/2) suggests that this is extramural, immediately adjacent to the bladder wall. Adventitial lesion could be a possibility. Imaging  features are not suggestive of an intramural or mucosal lesion. The lesion is discrete from the left ovary. The patient declined intravenous contrast which limits assessment of the nodule itself. Left internal ureteral stent evident with mild fullness of the left intrarenal collecting system and left ureter. Bowel:  Unremarkable visualized pelvic bowel loops. Vascular/Lymphatic: No vascular abnormality evident on noncontrast imaging. No pelvic lymphadenopathy. Reproductive: Uterus measures 7.0 x 3.4 x 7.5 cm with multiple fibroids evident. Dominant fibroid is an exophytic right fundal lesion measuring up to 4.1 cm. Left ovary measures 2.2 x 1.3 x 1.5 cm without mass lesion. Right ovary is not discretely visible. Other:  No intraperitoneal free fluid. Musculoskeletal: No suspicious marrow signal abnormality. IMPRESSION: 1. 2.6 x 1.6 x 2.3 cm well-defined homogeneous lesion in the left pelvis, immediately adjacent to the urinary bladder. Although no discrete fat plane persists between the bladder wall and this lesion, coronal imaging suggests that this lesion is extramural, immediately adjacent to the bladder wall. Adventitial lesion could be a possibility. Imaging features are not suggestive of an intramural or mucosal lesion. The patient declined intravenous contrast which limits assessment of the nodule itself. Of note, this lesion was present on the CT scan from 07/10/2019 and measured 2.3 x 1.3 x 2.3 cm at that time suggesting minimal if any change. This relative stability over 3 years is most suggestive of a benign etiology. Given the presence of the left renal stent, this patient may undergo additional cross-sectional imaging in the future instability of the finding could be confirmed on those exams. If the patient is not likely to undergo additional cross-sectional imaging, repeat MRI in 6-12 months could be used to ensure continued stability. Having the patient completely emptied the bladder prior to scanning  may prove helpful to more definitively separate this lesion from the bladder wall if it is truly extramural. 2. No pelvic lymphadenopathy. 3. Fibroid uterus. 4. Posterolateral right-sided bladder diverticulum. Electronically Signed   By: Misty Stanley M.D.   On: 07/10/2022 10:55   DG C-Arm 1-60 Min  Result Date: 07/07/2022 CLINICAL DATA:  Elective surgery. Cystoscopy with retrograde. Stent placement. EXAM: DG C-ARM 1-60 MIN COMPARISON:  CT and ultrasound 07/07/2022 FINDINGS: Intraoperative fluoroscopy is utilized for surgical control purposes. Fluoroscopy time was 49.6 seconds. Dose 10.670 mGy. Six spot fluoroscopic images. Spot fluoroscopic images demonstrate cystoscopy with cannulation of the left ureter. Contrast injection shows prominent hydronephrosis and hydroureter to the level of the low pelvis. Subsequent images demonstrate a ureteral stent placed. IMPRESSION: Intraoperative fluoroscopy obtained for surgical control purposes demonstrating left ureteral stent placement. Electronically Signed   By: Lucienne Capers M.D.   On: 07/07/2022 19:43   US RENAL  Result Date: 07/07/2022 CLINICAL DATA:  Hydronephrosis EXAM: RENAL / URINARY TRACT ULTRASOUND COMPLETE COMPARISON:  CT abdomen pelvis 07/07/2022 FINDINGS: Right Kidney: Renal measurements: 10.0 x 4.7 x  4.9 cm = volume: 121 mL. Normal cortical thickness and echogenicity. No mass, hydronephrosis or shadowing calcification. Left Kidney: Renal measurements: 12.4 x 6.6 x 6.8 cm = volume: 289 mL. Normal cortical thickness and echogenicity. Mild LEFT hydronephrosis. Multiple LEFT renal cysts largest measuring 5.3 cm and 3.4 cm in greatest sizes; no follow-up imaging recommended. No shadowing calculi. Bladder: Dependent debris in bladder. Ureteral jets were not visualized. RIGHT-sided bladder diverticulum noted as on CT. Other: N/A IMPRESSION: Mild LEFT hydronephrosis. 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 Electronically Signed    By: Lavonia Dana M.D.   On: 07/07/2022 13:58   CT Abdomen Pelvis W Contrast  Result Date: 07/07/2022 CLINICAL DATA:  Status post fall with altered mental status. EXAM: CT ABDOMEN AND PELVIS WITH CONTRAST TECHNIQUE: Multidetector CT imaging of the abdomen and pelvis was performed using the standard protocol following bolus administration of intravenous contrast. RADIATION DOSE REDUCTION: This exam was performed according to the departmental dose-optimization program which includes automated exposure control, adjustment of the mA and/or kV according to patient size and/or use of iterative reconstruction technique. CONTRAST:  30m OMNIPAQUE IOHEXOL 350 MG/ML SOLN COMPARISON:  July 10, 2019 FINDINGS: Lower chest: Mild atelectasis is seen within the bilateral lung bases. Hepatobiliary: A stable 5 mm focus of parenchymal low attenuation is seen within the posterior aspect of a shrunken, lobulated left lobe of the liver. No gallstones, gallbladder wall thickening, or biliary dilatation. Pancreas: Unremarkable. No pancreatic ductal dilatation or surrounding inflammatory changes. Spleen: Normal in size without focal abnormality. Adrenals/Urinary Tract: There is mild calcification of the lateral limb of the left adrenal gland. Kidneys are normal in size. Multiple simple cysts are seen within the left kidney. The largest measures approximately 4.4 cm x 2.2 cm. Adjacent 2 mm ureteral calculi are noted within the distal left ureter (axial CT images 74 and 75, CT series 3). Marked severity left-sided hydronephrosis and hydroureter are also seen, with mild left-sided peripelvic and periureteral inflammatory fat stranding. A cluster of subcentimeter nonobstructing renal calculi are seen within the posterior aspect of the mid right kidney. An 18 mm x 13 mm urinary bladder diverticulum is seen along the posterolateral aspect of the bladder on the right. Stable 18 mm x 19 mm x 23 mm well-defined mildly hyperdense  (approximately 57.42 Hounsfield units) soft tissue nodule is seen adjacent to the inferolateral aspect of the urinary bladder on the left (axial CT images 80 through 83, CT series 3). Stomach/Bowel: Stomach is within normal limits. Appendix appears normal. No evidence of bowel wall thickening, distention, or inflammatory changes. Noninflamed diverticula are seen throughout the large bowel. Vascular/Lymphatic: Aortic atherosclerosis. No enlarged abdominal or pelvic lymph nodes. Reproductive: The uterus is lobulated and heterogeneous in appearance. A 3.9 cm x 3.3 cm exophytic, calcified fibroid is seen within the uterine fundus on the right. The bilateral adnexa are unremarkable. Other: There is a 4.0 cm x 2.2 cm fat containing umbilical hernia. No abdominopelvic ascites. Musculoskeletal: Marked severity multilevel degenerative changes are seen throughout the lumbar spine. IMPRESSION: 1. Adjacent 2 mm ureteral calculi within the distal left ureter, with marked severity left-sided hydronephrosis and hydroureter. 2. Cluster of subcentimeter nonobstructing right renal calculi. 3. Colonic diverticulosis. 4. Findings likely consistent with a calcified fibroid uterus. 5. Fat containing umbilical hernia. 6. Marked severity multilevel degenerative changes throughout the lumbar spine. 7. Aortic atherosclerosis. 8. Stable mildly hyperdense soft tissue nodule adjacent to the inferolateral aspect of the urinary bladder on the left. MRI correlation is recommended,  as an underlying neoplastic process cannot be excluded. Aortic Atherosclerosis (ICD10-I70.0). Electronically Signed   By: Virgina Norfolk M.D.   On: 07/07/2022 01:47   CT Head Wo Contrast  Result Date: 07/06/2022 CLINICAL DATA:  Fall, knee pain EXAM: CT HEAD WITHOUT CONTRAST TECHNIQUE: Contiguous axial images were obtained from the base of the skull through the vertex without intravenous contrast. RADIATION DOSE REDUCTION: This exam was performed according to the  departmental dose-optimization program which includes automated exposure control, adjustment of the mA and/or kV according to patient size and/or use of iterative reconstruction technique. COMPARISON:  None Available. FINDINGS: Brain: No acute intracranial hemorrhage. No focal mass lesion. No CT evidence of acute infarction. No midline shift or mass effect. No hydrocephalus. Basilar cisterns are patent. There are periventricular and subcortical white matter hypodensities. Generalized cortical atrophy. Vascular: No hyperdense vessel or unexpected calcification. Skull: Normal. Negative for fracture or focal lesion. Sinuses/Orbits: Paranasal sinuses and mastoid air cells are clear. Orbits are clear. Other: None. IMPRESSION: No acute intracranial findings. Atrophy and white matter microvascular disease. Electronically Signed   By: Suzy Bouchard M.D.   On: 07/06/2022 13:25   DG Knee Complete 4 Views Right  Result Date: 07/06/2022 CLINICAL DATA:  Fall 1 week ago, right knee pain EXAM: RIGHT KNEE - COMPLETE 4+ VIEW COMPARISON:  07/12/2019 FINDINGS: Osteopenia. No fracture or dislocation of the right knee. 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 No knee joint effusion. Soft tissues unremarkable. IMPRESSION: 1. Osteopenia. No fracture or dislocation of the right knee. 2. 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 Electronically Signed   By: Delanna Ahmadi M.D.   On: 07/06/2022 13:04   DG Chest 2 View  Result Date: 07/06/2022 CLINICAL DATA:  Weakness, fall 1 week ago altered mental status EXAM: CHEST - 2 VIEW COMPARISON:  07/12/2019 FINDINGS: The heart size and mediastinal contours are within normal limits. Probable trace pleural effusions. Disc degenerative disease of the thoracic spine. IMPRESSION: Probable trace pleural effusions. No focal airspace opacity. Electronically Signed   By: Delanna Ahmadi M.D.   On: 07/06/2022 13:02     Microbiology: Results for orders placed or performed during the hospital encounter of 07/06/22  Urine Culture     Status: Abnormal   Collection Time: 07/06/22 11:54 PM   Specimen: In/Out Cath Urine  Result Value Ref Range Status   Specimen Description IN/OUT CATH URINE  Final   Special Requests   Final    NONE Performed at [facility], [address], [place], [place] [address]    Culture >=100,000 COLONIES/mL ENTEROCOCCUS FAECALIS (A)  Final   Report Status 07/09/2022 FINAL  Final   Organism ID, Bacteria ENTEROCOCCUS FAECALIS (A)  Final      Susceptibility   Enterococcus faecalis - MIC*    AMPICILLIN <=2 SENSITIVE Sensitive     NITROFURANTOIN <=16 SENSITIVE Sensitive     VANCOMYCIN 1 SENSITIVE Sensitive     * >=100,000 COLONIES/mL ENTEROCOCCUS FAECALIS  Culture, blood (routine x 2)     Status: None   Collection Time: 07/07/22 12:01 AM   Specimen: BLOOD  Result Value Ref Range Status   Specimen Description BLOOD SITE NOT SPECIFIED  Final   Special Requests   Final    BOTTLES DRAWN AEROBIC AND ANAEROBIC Blood Culture adequate volume   Culture   Final    NO GROWTH 5 DAYS Performed at [facility], [place] [address], [place], [place] [address]    Report  Status 07/12/2022 FINAL  Final  Culture, blood (routine x 2)     Status: None   Collection Time: 07/07/22 12:01 AM   Specimen: BLOOD  Result Value Ref Range Status   Specimen Description BLOOD SITE NOT SPECIFIED  Final   Special Requests   Final    BOTTLES DRAWN AEROBIC AND ANAEROBIC Blood Culture adequate volume   Culture   Final    NO GROWTH 5 DAYS Performed at [facility], [address], [place], [place] [address]    Report Status 07/12/2022 FINAL  Final  Resp Panel by RT-PCR (Flu A&B, Covid) Anterior Nasal Swab     Status: None   Collection Time: 07/07/22 12:20 AM   Specimen: Anterior Nasal Swab  Result Value Ref Range Status   SARS Coronavirus 2 by RT PCR NEGATIVE NEGATIVE Final    Comment:  (NOTE) SARS-CoV-2 target nucleic acids are NOT DETECTED.  The SARS-CoV-2 RNA is generally detectable in upper respiratory specimens during the acute phase of infection. The lowest concentration of SARS-CoV-2 viral copies this assay can detect is 138 copies/mL. A negative result does not preclude SARS-Cov-2 infection and should not be used as the sole basis for treatment or other patient management decisions. A negative result may occur with  improper specimen collection/handling, submission of specimen other than nasopharyngeal swab, presence of viral mutation(s) within the areas targeted by this assay, and inadequate number of viral copies(<138 copies/mL). A negative result must be combined with clinical observations, patient history, and epidemiological information. The expected result is Negative.  Fact Sheet for Patients:  EntrepreneurPulse.com.au  Fact Sheet for Healthcare Providers:  IncredibleEmployment.be  This test is no t yet approved or cleared by the [place] FDA and  has been authorized for detection and/or diagnosis of SARS-CoV-2 by FDA under an Emergency Use Authorization (EUA). This EUA will remain  in effect (meaning this test can be used) for the duration of the COVID-19 declaration under Section 564(b)(1) of the Act, 21 U.S.C.section 360bbb-3(b)(1), unless the authorization is terminated  or revoked sooner.       Influenza A by PCR NEGATIVE NEGATIVE Final   Influenza B by PCR NEGATIVE NEGATIVE Final    Comment: (NOTE) The Xpert Xpress SARS-CoV-2/FLU/RSV plus assay is intended as an aid in the diagnosis of influenza from Nasopharyngeal swab specimens and should not be used as a sole basis for treatment. Nasal washings and aspirates are unacceptable for Xpert Xpress SARS-CoV-2/FLU/RSV testing.  Fact Sheet for Patients: EntrepreneurPulse.com.au  Fact Sheet for Healthcare  Providers: IncredibleEmployment.be  This test is not yet approved or cleared by the [place] FDA and has been authorized for detection and/or diagnosis of SARS-CoV-2 by FDA under an Emergency Use Authorization (EUA). This EUA will remain in effect (meaning this test can be used) for the duration of the COVID-19 declaration under Section 564(b)(1) of the Act, 21 U.S.C. section 360bbb-3(b)(1), unless the authorization is terminated or revoked.  Performed at [facility], [place] [address], [place], [place] [address]     Labs: CBC: Recent Labs  Lab 07/06/22 1203 07/10/22 0055 07/11/22 0052  WBC 20.5* 10.8* 11.1*  NEUTROABS 17.0*  --   --   HGB 12.4 10.4* 10.7*  HCT 39.3 32.5* 34.5*  MCV 101.3* 100.3* 101.5*  PLT 510* 473* 300*   Basic Metabolic Panel: Recent Labs  Lab 07/06/22 1203 07/10/22 0055 07/11/22 0052  NA 138 141 140  K 4.1 4.0 3.9  CL 100 104 105  CO2 '24 26 27  '$ GLUCOSE 154* 100* 113*  BUN 24* 12 9  CREATININE 1.31* 0.87 0.75  CALCIUM 8.9 8.5* 8.5*   Liver Function Tests: Recent Labs  Lab 07/06/22 1203  AST 33  ALT 62*  ALKPHOS 130*  BILITOT 1.2  PROT 6.5  ALBUMIN 2.3*   CBG: No results for input(s): "GLUCAP" in the last 168 hours.  Discharge time spent: greater than 30 minutes.  Signed: Elmarie Shiley, MD [facility] 07/12/2022

## 2022-07-12 NOTE — Progress Notes (Addendum)
Order received to discharge patient.  PIV access x1 removed.  Report called to receiving RN at [facility].  Patient and her belongings sent to SNF via [facility].

## 2022-07-13 DIAGNOSIS — G9341 Metabolic encephalopathy: Secondary | ICD-10-CM | POA: Diagnosis not present

## 2022-07-13 DIAGNOSIS — N132 Hydronephrosis with renal and ureteral calculous obstruction: Secondary | ICD-10-CM | POA: Diagnosis not present

## 2022-07-13 DIAGNOSIS — E559 Vitamin D deficiency, unspecified: Secondary | ICD-10-CM | POA: Diagnosis not present

## 2022-07-13 DIAGNOSIS — N390 Urinary tract infection, site not specified: Secondary | ICD-10-CM | POA: Diagnosis not present

## 2022-07-13 DIAGNOSIS — M069 Rheumatoid arthritis, unspecified: Secondary | ICD-10-CM | POA: Diagnosis not present

## 2022-07-13 DIAGNOSIS — I10 Essential (primary) hypertension: Secondary | ICD-10-CM | POA: Diagnosis not present

## 2022-07-13 DIAGNOSIS — N136 Pyonephrosis: Secondary | ICD-10-CM | POA: Diagnosis not present

## 2022-07-13 DIAGNOSIS — Z9181 History of falling: Secondary | ICD-10-CM | POA: Diagnosis not present

## 2022-07-14 ENCOUNTER — Other Ambulatory Visit: Payer: Self-pay | Admitting: Urology

## 2022-07-14 DIAGNOSIS — D72829 Elevated white blood cell count, unspecified: Secondary | ICD-10-CM | POA: Diagnosis not present

## 2022-07-14 DIAGNOSIS — E119 Type 2 diabetes mellitus without complications: Secondary | ICD-10-CM | POA: Diagnosis not present

## 2022-07-14 DIAGNOSIS — I10 Essential (primary) hypertension: Secondary | ICD-10-CM | POA: Diagnosis not present

## 2022-07-18 DIAGNOSIS — G9340 Encephalopathy, unspecified: Secondary | ICD-10-CM | POA: Diagnosis not present

## 2022-07-18 DIAGNOSIS — N179 Acute kidney failure, unspecified: Secondary | ICD-10-CM | POA: Diagnosis not present

## 2022-07-18 DIAGNOSIS — K219 Gastro-esophageal reflux disease without esophagitis: Secondary | ICD-10-CM | POA: Diagnosis not present

## 2022-07-18 DIAGNOSIS — N132 Hydronephrosis with renal and ureteral calculous obstruction: Secondary | ICD-10-CM | POA: Diagnosis not present

## 2022-07-19 DIAGNOSIS — K219 Gastro-esophageal reflux disease without esophagitis: Secondary | ICD-10-CM | POA: Diagnosis not present

## 2022-07-19 DIAGNOSIS — N179 Acute kidney failure, unspecified: Secondary | ICD-10-CM | POA: Diagnosis not present

## 2022-07-19 DIAGNOSIS — G9340 Encephalopathy, unspecified: Secondary | ICD-10-CM | POA: Diagnosis not present

## 2022-07-19 DIAGNOSIS — I10 Essential (primary) hypertension: Secondary | ICD-10-CM | POA: Diagnosis not present

## 2022-07-20 DIAGNOSIS — E559 Vitamin D deficiency, unspecified: Secondary | ICD-10-CM | POA: Diagnosis not present

## 2022-07-20 DIAGNOSIS — I10 Essential (primary) hypertension: Secondary | ICD-10-CM | POA: Diagnosis not present

## 2022-07-20 DIAGNOSIS — N390 Urinary tract infection, site not specified: Secondary | ICD-10-CM | POA: Diagnosis not present

## 2022-07-20 DIAGNOSIS — N132 Hydronephrosis with renal and ureteral calculous obstruction: Secondary | ICD-10-CM | POA: Diagnosis not present

## 2022-07-20 DIAGNOSIS — N136 Pyonephrosis: Secondary | ICD-10-CM | POA: Diagnosis not present

## 2022-07-20 DIAGNOSIS — Z9181 History of falling: Secondary | ICD-10-CM | POA: Diagnosis not present

## 2022-07-20 DIAGNOSIS — M069 Rheumatoid arthritis, unspecified: Secondary | ICD-10-CM | POA: Diagnosis not present

## 2022-07-21 DIAGNOSIS — I10 Essential (primary) hypertension: Secondary | ICD-10-CM | POA: Diagnosis not present

## 2022-07-21 DIAGNOSIS — K219 Gastro-esophageal reflux disease without esophagitis: Secondary | ICD-10-CM | POA: Diagnosis not present

## 2022-07-21 DIAGNOSIS — M6281 Muscle weakness (generalized): Secondary | ICD-10-CM | POA: Diagnosis not present

## 2022-07-25 DIAGNOSIS — U071 COVID-19: Secondary | ICD-10-CM | POA: Diagnosis not present

## 2022-07-27 DIAGNOSIS — N390 Urinary tract infection, site not specified: Secondary | ICD-10-CM | POA: Diagnosis not present

## 2022-07-27 DIAGNOSIS — I10 Essential (primary) hypertension: Secondary | ICD-10-CM | POA: Diagnosis not present

## 2022-07-27 DIAGNOSIS — E559 Vitamin D deficiency, unspecified: Secondary | ICD-10-CM | POA: Diagnosis not present

## 2022-07-27 DIAGNOSIS — N132 Hydronephrosis with renal and ureteral calculous obstruction: Secondary | ICD-10-CM | POA: Diagnosis not present

## 2022-07-27 DIAGNOSIS — M069 Rheumatoid arthritis, unspecified: Secondary | ICD-10-CM | POA: Diagnosis not present

## 2022-07-27 DIAGNOSIS — N136 Pyonephrosis: Secondary | ICD-10-CM | POA: Diagnosis not present

## 2022-07-27 DIAGNOSIS — Z9181 History of falling: Secondary | ICD-10-CM | POA: Diagnosis not present

## 2022-07-28 ENCOUNTER — Other Ambulatory Visit: Payer: Self-pay | Admitting: *Deleted

## 2022-07-28 DIAGNOSIS — U071 COVID-19: Secondary | ICD-10-CM | POA: Diagnosis not present

## 2022-07-28 DIAGNOSIS — K219 Gastro-esophageal reflux disease without esophagitis: Secondary | ICD-10-CM | POA: Diagnosis not present

## 2022-07-28 DIAGNOSIS — M6281 Muscle weakness (generalized): Secondary | ICD-10-CM | POA: Diagnosis not present

## 2022-07-28 NOTE — Patient Outreach (Signed)
[facility] Coordinator follow up. Ms. Mcqueary resides in [facility]. Screening for potential [facility] care coordination services as benefit of insurance plan and PCP.  Met with SNF social worker at [facility]. Ms. Jernberg has tested positive for COVID. Transition plan is ILF vs ALF upon SNF discharge.    Marthenia Rolling, MSN, RN,BSN [facility] Acute Care Coordinator [phone] (Direct dial)

## 2022-07-31 DIAGNOSIS — K219 Gastro-esophageal reflux disease without esophagitis: Secondary | ICD-10-CM | POA: Diagnosis not present

## 2022-07-31 DIAGNOSIS — M6281 Muscle weakness (generalized): Secondary | ICD-10-CM | POA: Diagnosis not present

## 2022-07-31 DIAGNOSIS — U071 COVID-19: Secondary | ICD-10-CM | POA: Diagnosis not present

## 2022-08-01 NOTE — Progress Notes (Signed)
Preop instructions for:     Danielle Dixon Date of Birth:   09/19/1940                    Date of Procedure:   08-08-22 Procedure:     Cystoscopy, L ureteroscopy with stent placement Surgeon: Dr. Link Snuffer Facility contact: [facility]     Phone:   [place]: RN contact name/phone#:                          and Fax #: [phone]   Transportation contact phone#:   Please send day of procedure:  Current med list  Medications taken the day of procedure confirm time of nothing by mouth status Patient Demographic info( to include DNR status, problem list, allergies) Bring Insurance card and picture ID    [facility] to arrive at [facility]:  9:00 am   Report to: Admitting (On your left hand side)    Do not eat solid food or drink liquids past midnight the night before your procedure.(To include any tube feedings-must be discontinued)  Take these morning medications only with sips of water.(or give through gastrostomy or feeding tube).  Amlodipine Cetirizine Omeprazole Paroxetine Okay to use Albuterol inhaler  Note: No Insulin or Diabetic meds should be given or taken the morning of the procedure!   Leave all jewelry and other valuables at place where living( no metal or rings to be worn) No contact lens Women-no make-up, no lotions,perfumes,powders Men-no colognes,lotions   Any questions day of procedure,call  SHORT STAY-[phone]     Sent from :[facility]                   Phone:[phone]                   Fax:[phone]   Sent by :    Norvel Richards, RN

## 2022-08-01 NOTE — Progress Notes (Signed)
COVID Vaccine Completed:  Date of COVID positive in last 90 days:  PCP - Sela Hilding, MD Cardiologist -   Chest x-ray - 07-06-22 Epic EKG - 07-07-22 Epic Stress Test -  ECHO -  Cardiac Cath -  Pacemaker/ICD device last checked: Spinal Cord Stimulator:  Bowel Prep -   Sleep Study -  CPAP -   Fasting Blood Sugar -  Checks Blood Sugar _____ times a day  Last dose of GLP1 agonist-  N/A GLP1 instructions:  N/A   Last dose of SGLT-2 inhibitors-  N/A SGLT-2 instructions: N/A   Blood Thinner Instructions: Aspirin Instructions: Last Dose:  Activity level:  Can go up a flight of stairs and perform activities of daily living without stopping and without symptoms of chest pain or shortness of breath.  Able to exercise without symptoms  Unable to go up a flight of stairs without symptoms of     Anesthesia review: Abnormal R wave progression on EKG, COPD  Patient denies shortness of breath, fever, cough and chest pain at PAT appointment  Patient verbalized understanding of instructions that were given to them at the PAT appointment. Patient was also instructed that they will need to review over the PAT instructions again at home before surgery.

## 2022-08-02 ENCOUNTER — Encounter (HOSPITAL_COMMUNITY): Payer: Self-pay | Admitting: Physician Assistant

## 2022-08-02 NOTE — Progress Notes (Signed)
Neoma Laming from [facility] phoned with updated on patient.  She stated that patient tested positive for Covid on 07-26-22 and still has a residual cough.  She is currently in isolation at [facility] is unsure when she will be moved out of isolation.  Jessica Ward PA-C made aware.  Neoma Laming can be reached at [phone] or [phone].

## 2022-08-04 DIAGNOSIS — N136 Pyonephrosis: Secondary | ICD-10-CM | POA: Diagnosis not present

## 2022-08-04 DIAGNOSIS — N132 Hydronephrosis with renal and ureteral calculous obstruction: Secondary | ICD-10-CM | POA: Diagnosis not present

## 2022-08-04 DIAGNOSIS — I10 Essential (primary) hypertension: Secondary | ICD-10-CM | POA: Diagnosis not present

## 2022-08-04 DIAGNOSIS — Z9181 History of falling: Secondary | ICD-10-CM | POA: Diagnosis not present

## 2022-08-04 DIAGNOSIS — M069 Rheumatoid arthritis, unspecified: Secondary | ICD-10-CM | POA: Diagnosis not present

## 2022-08-04 DIAGNOSIS — N390 Urinary tract infection, site not specified: Secondary | ICD-10-CM | POA: Diagnosis not present

## 2022-08-04 DIAGNOSIS — E559 Vitamin D deficiency, unspecified: Secondary | ICD-10-CM | POA: Diagnosis not present

## 2022-08-08 ENCOUNTER — Encounter (HOSPITAL_COMMUNITY): Admission: RE | Payer: Self-pay | Source: Home / Self Care

## 2022-08-08 ENCOUNTER — Other Ambulatory Visit: Payer: Self-pay | Admitting: Urology

## 2022-08-08 ENCOUNTER — Ambulatory Visit (HOSPITAL_COMMUNITY): Admission: RE | Admit: 2022-08-08 | Payer: Medicare Other | Source: Home / Self Care | Admitting: Urology

## 2022-08-08 SURGERY — CYSTOSCOPY/URETEROSCOPY/HOLMIUM LASER/STENT PLACEMENT
Anesthesia: General | Laterality: Left

## 2022-08-11 ENCOUNTER — Other Ambulatory Visit: Payer: Self-pay | Admitting: *Deleted

## 2022-08-11 NOTE — Patient Outreach (Signed)
[facility] Post- Acute Care Coordinator follow up. [name] resides in [facility].   Update received from Marcene Corning therapy [employer]. [name] has transitioned to long term.   No identifiable [facility] care coordination needs at this time.   Marthenia Rolling, MSN, RN,BSN [facility] Acute Care Coordinator [phone] (Direct dial)

## 2022-08-18 DIAGNOSIS — M25561 Pain in right knee: Secondary | ICD-10-CM | POA: Diagnosis not present

## 2022-08-18 DIAGNOSIS — F4323 Adjustment disorder with mixed anxiety and depressed mood: Secondary | ICD-10-CM | POA: Diagnosis not present

## 2022-08-26 ENCOUNTER — Emergency Department (HOSPITAL_COMMUNITY): Payer: Medicare Other

## 2022-08-26 ENCOUNTER — Inpatient Hospital Stay (HOSPITAL_COMMUNITY)
Admission: EM | Admit: 2022-08-26 | Discharge: 2022-08-31 | DRG: 871 | Disposition: A | Discharge status: Skilled Nursing Facility | Payer: Medicare Other | Attending: Family Medicine | Admitting: Family Medicine

## 2022-08-26 ENCOUNTER — Encounter (HOSPITAL_COMMUNITY): Payer: Self-pay | Admitting: Emergency Medicine

## 2022-08-26 ENCOUNTER — Other Ambulatory Visit: Payer: Self-pay

## 2022-08-26 DIAGNOSIS — F32A Depression, unspecified: Secondary | ICD-10-CM | POA: Diagnosis present

## 2022-08-26 DIAGNOSIS — N179 Acute kidney failure, unspecified: Secondary | ICD-10-CM | POA: Diagnosis present

## 2022-08-26 DIAGNOSIS — E876 Hypokalemia: Secondary | ICD-10-CM | POA: Diagnosis not present

## 2022-08-26 DIAGNOSIS — N1831 Chronic kidney disease, stage 3a: Secondary | ICD-10-CM | POA: Diagnosis present

## 2022-08-26 DIAGNOSIS — N3001 Acute cystitis with hematuria: Secondary | ICD-10-CM | POA: Diagnosis present

## 2022-08-26 DIAGNOSIS — L400 Psoriasis vulgaris: Secondary | ICD-10-CM | POA: Diagnosis present

## 2022-08-26 DIAGNOSIS — L4050 Arthropathic psoriasis, unspecified: Secondary | ICD-10-CM | POA: Diagnosis present

## 2022-08-26 DIAGNOSIS — A419 Sepsis, unspecified organism: Secondary | ICD-10-CM | POA: Diagnosis not present

## 2022-08-26 DIAGNOSIS — Z87442 Personal history of urinary calculi: Secondary | ICD-10-CM

## 2022-08-26 DIAGNOSIS — B952 Enterococcus as the cause of diseases classified elsewhere: Secondary | ICD-10-CM | POA: Diagnosis present

## 2022-08-26 DIAGNOSIS — F0280 Dementia in other diseases classified elsewhere without behavioral disturbance: Secondary | ICD-10-CM | POA: Diagnosis present

## 2022-08-26 DIAGNOSIS — G9341 Metabolic encephalopathy: Secondary | ICD-10-CM | POA: Diagnosis present

## 2022-08-26 DIAGNOSIS — M25561 Pain in right knee: Secondary | ICD-10-CM

## 2022-08-26 DIAGNOSIS — G8929 Other chronic pain: Secondary | ICD-10-CM | POA: Diagnosis present

## 2022-08-26 DIAGNOSIS — F419 Anxiety disorder, unspecified: Secondary | ICD-10-CM | POA: Diagnosis not present

## 2022-08-26 DIAGNOSIS — F02A3 Dementia in other diseases classified elsewhere, mild, with mood disturbance: Secondary | ICD-10-CM | POA: Diagnosis present

## 2022-08-26 DIAGNOSIS — Z7401 Bed confinement status: Secondary | ICD-10-CM | POA: Diagnosis not present

## 2022-08-26 DIAGNOSIS — R531 Weakness: Secondary | ICD-10-CM

## 2022-08-26 DIAGNOSIS — D72829 Elevated white blood cell count, unspecified: Secondary | ICD-10-CM | POA: Diagnosis present

## 2022-08-26 DIAGNOSIS — M1711 Unilateral primary osteoarthritis, right knee: Secondary | ICD-10-CM | POA: Diagnosis not present

## 2022-08-26 DIAGNOSIS — E669 Obesity, unspecified: Secondary | ICD-10-CM | POA: Diagnosis not present

## 2022-08-26 DIAGNOSIS — Z79899 Other long term (current) drug therapy: Secondary | ICD-10-CM

## 2022-08-26 DIAGNOSIS — M069 Rheumatoid arthritis, unspecified: Secondary | ICD-10-CM | POA: Diagnosis not present

## 2022-08-26 DIAGNOSIS — N281 Cyst of kidney, acquired: Secondary | ICD-10-CM | POA: Diagnosis not present

## 2022-08-26 DIAGNOSIS — K429 Umbilical hernia without obstruction or gangrene: Secondary | ICD-10-CM | POA: Diagnosis not present

## 2022-08-26 DIAGNOSIS — M05841 Other rheumatoid arthritis with rheumatoid factor of right hand: Secondary | ICD-10-CM | POA: Diagnosis present

## 2022-08-26 DIAGNOSIS — R488 Other symbolic dysfunctions: Secondary | ICD-10-CM | POA: Diagnosis present

## 2022-08-26 DIAGNOSIS — Z960 Presence of urogenital implants: Secondary | ICD-10-CM | POA: Diagnosis not present

## 2022-08-26 DIAGNOSIS — Z6832 Body mass index (BMI) 32.0-32.9, adult: Secondary | ICD-10-CM | POA: Diagnosis not present

## 2022-08-26 DIAGNOSIS — R21 Rash and other nonspecific skin eruption: Secondary | ICD-10-CM | POA: Diagnosis not present

## 2022-08-26 DIAGNOSIS — G309 Alzheimer's disease, unspecified: Secondary | ICD-10-CM | POA: Diagnosis present

## 2022-08-26 DIAGNOSIS — J449 Chronic obstructive pulmonary disease, unspecified: Secondary | ICD-10-CM | POA: Diagnosis present

## 2022-08-26 DIAGNOSIS — N390 Urinary tract infection, site not specified: Secondary | ICD-10-CM | POA: Diagnosis present

## 2022-08-26 DIAGNOSIS — I10 Essential (primary) hypertension: Secondary | ICD-10-CM | POA: Diagnosis present

## 2022-08-26 DIAGNOSIS — R279 Unspecified lack of coordination: Secondary | ICD-10-CM | POA: Diagnosis present

## 2022-08-26 DIAGNOSIS — I129 Hypertensive chronic kidney disease with stage 1 through stage 4 chronic kidney disease, or unspecified chronic kidney disease: Secondary | ICD-10-CM | POA: Diagnosis present

## 2022-08-26 DIAGNOSIS — R609 Edema, unspecified: Secondary | ICD-10-CM | POA: Diagnosis not present

## 2022-08-26 DIAGNOSIS — L413 Small plaque parapsoriasis: Secondary | ICD-10-CM | POA: Diagnosis present

## 2022-08-26 DIAGNOSIS — L4052 Psoriatic arthritis mutilans: Secondary | ICD-10-CM | POA: Diagnosis present

## 2022-08-26 DIAGNOSIS — M6281 Muscle weakness (generalized): Secondary | ICD-10-CM | POA: Diagnosis present

## 2022-08-26 LAB — CBC WITH DIFFERENTIAL/PLATELET
Abs Immature Granulocytes: 0.12 10*3/uL — ABNORMAL HIGH (ref 0.00–0.07)
Basophils Absolute: 0.1 10*3/uL (ref 0.0–0.1)
Basophils Relative: 0 %
Eosinophils Absolute: 0.4 10*3/uL (ref 0.0–0.5)
Eosinophils Relative: 2 %
HCT: 49.5 % — ABNORMAL HIGH (ref 36.0–46.0)
Hemoglobin: 14.5 g/dL (ref 12.0–15.0)
Immature Granulocytes: 1 %
Lymphocytes Relative: 18 %
Lymphs Abs: 4 10*3/uL (ref 0.7–4.0)
MCH: 30.3 pg (ref 26.0–34.0)
MCHC: 29.3 g/dL — ABNORMAL LOW (ref 30.0–36.0)
MCV: 103.6 fL — ABNORMAL HIGH (ref 80.0–100.0)
Monocytes Absolute: 0.9 10*3/uL (ref 0.1–1.0)
Monocytes Relative: 4 %
Neutro Abs: 16.4 10*3/uL — ABNORMAL HIGH (ref 1.7–7.7)
Neutrophils Relative %: 75 %
Platelets: 436 10*3/uL — ABNORMAL HIGH (ref 150–400)
RBC: 4.78 MIL/uL (ref 3.87–5.11)
RDW: 14.6 % (ref 11.5–15.5)
WBC: 21.9 10*3/uL — ABNORMAL HIGH (ref 4.0–10.5)
nRBC: 0 % (ref 0.0–0.2)

## 2022-08-26 LAB — URINALYSIS, ROUTINE W REFLEX MICROSCOPIC
Bilirubin Urine: NEGATIVE
Glucose, UA: NEGATIVE mg/dL
Ketones, ur: NEGATIVE mg/dL
Nitrite: NEGATIVE
Protein, ur: 100 mg/dL — AB
RBC / HPF: >50 RBC/hpf — ABNORMAL HIGH (ref 0–5)
Specific Gravity, Urine: 1.011 (ref 1.005–1.030)
WBC, UA: >50 WBC/hpf — ABNORMAL HIGH (ref 0–5)
pH: 6 (ref 5.0–8.0)

## 2022-08-26 LAB — COMPREHENSIVE METABOLIC PANEL
ALT: 18 U/L (ref 0–44)
AST: 19 U/L (ref 15–41)
Albumin: 3.3 g/dL — ABNORMAL LOW (ref 3.5–5.0)
Alkaline Phosphatase: 116 U/L (ref 38–126)
Anion gap: 10 (ref 5–15)
BUN: 14 mg/dL (ref 8–23)
CO2: 25 mmol/L (ref 22–32)
Calcium: 9.6 mg/dL (ref 8.9–10.3)
Chloride: 105 mmol/L (ref 98–111)
Creatinine, Ser: 0.99 mg/dL (ref 0.44–1.00)
GFR, Estimated: 57 mL/min — ABNORMAL LOW (ref >60)
Glucose, Bld: 142 mg/dL — ABNORMAL HIGH (ref 70–99)
Potassium: 3.9 mmol/L (ref 3.5–5.1)
Sodium: 140 mmol/L (ref 135–145)
Total Bilirubin: 0.4 mg/dL (ref 0.3–1.2)
Total Protein: 7.6 g/dL (ref 6.5–8.1)

## 2022-08-26 LAB — AMMONIA: Ammonia: 15 umol/L (ref 9–35)

## 2022-08-26 LAB — LACTIC ACID, PLASMA: Lactic Acid, Venous: 2.9 mmol/L (ref 0.5–1.9)

## 2022-08-26 MED ORDER — ACETAMINOPHEN 325 MG PO TABS: 650.0000 mg | ORAL_TABLET | Freq: Four times a day (QID) | ORAL | Status: DC | PRN

## 2022-08-26 MED ORDER — ONDANSETRON HCL 4 MG PO TABS: 4.0000 mg | ORAL_TABLET | Freq: Four times a day (QID) | ORAL | Status: DC | PRN

## 2022-08-26 MED ORDER — ACETAMINOPHEN 650 MG RE SUPP: 650.0000 mg | Freq: Four times a day (QID) | RECTAL | Status: DC | PRN

## 2022-08-26 MED ORDER — SODIUM CHLORIDE 0.9 % IV SOLN
1.0000 g | INTRAVENOUS | Status: DC
  Administered 2022-08-27: 1 g via INTRAVENOUS
  Filled 2022-08-26: qty 10

## 2022-08-26 MED ORDER — ENOXAPARIN SODIUM 40 MG/0.4ML IJ SOSY
40.0000 mg | PREFILLED_SYRINGE | Freq: Every day | INTRAMUSCULAR | Status: DC
  Administered 2022-08-27 – 2022-08-30 (×5): 40 mg via SUBCUTANEOUS
  Filled 2022-08-26 (×5): qty 0.4

## 2022-08-26 MED ORDER — IOHEXOL 300 MG/ML  SOLN
100.0000 mL | Freq: Once | INTRAMUSCULAR | Status: AC | PRN
  Administered 2022-08-26: 100 mL via INTRAVENOUS

## 2022-08-26 MED ORDER — SODIUM CHLORIDE 0.45 % IV SOLN: INTRAVENOUS | Status: DC

## 2022-08-26 MED ORDER — ONDANSETRON HCL 4 MG/2ML IJ SOLN: 4.0000 mg | Freq: Four times a day (QID) | INTRAMUSCULAR | Status: DC | PRN

## 2022-08-26 MED ORDER — SODIUM CHLORIDE 0.9 % IV SOLN
2.0000 g | Freq: Once | INTRAVENOUS | Status: AC
  Administered 2022-08-26: 2 g via INTRAVENOUS
  Filled 2022-08-26: qty 20

## 2022-08-26 NOTE — ED Provider Triage Note (Addendum)
Emergency Medicine Provider Triage Evaluation Note  Monea Pesantez , a 81 y.o. female  was evaluated in triage.  Pt complains of right knee pain started this morning. She reports that she feel a few weeks ago on the knee. Denies any fevers.  Review of Systems  Positive:  Negative:   Physical Exam  BP 133/72 (BP Location: Right Arm)   Pulse 85   Temp 98 F (36.7 C) (Oral)   Resp 15   SpO2 100%  Gen:   Awake, no distress   Resp:  Normal effort  MSK:   Moves extremities without difficulty  Other:  Swelling and diffuse tenderness overlying the right knee. No erythema or rash overlying. Compartments are soft.   Medical Decision Making  Medically screening exam initiated at 1:32 PM.  Appropriate orders placed.  Brendan Gruwell was informed that the remainder of the evaluation will be completed by another provider, this initial triage assessment does not replace that evaluation, and the importance of remaining in the ED until their evaluation is complete.  Xr ordered.   ADDEND: Patient is confused as she thinks her sister brought her up her but she arrived via EMS.   Sherrell Puller, PA-C 08/26/22 1334    Sherrell Puller, PA-C 08/26/22 1411

## 2022-08-26 NOTE — ED Triage Notes (Signed)
Pt BIB [facility] from [facility]. Pt reports right knee pain and swelling. Pt normally ambulatory with walker. Tender to palpation.

## 2022-08-26 NOTE — ED Provider Notes (Signed)
[facility] DEPT Provider Note   CSN: [id number] Arrival date & time: 08/26/22  1229     History Chief Complaint  Patient presents with   Knee Pain    Danielle Dixon is a 81 y.o. female with history of rheumatoid arthritis, hypertension, dementia, COPD presents the emergency room today for evaluation of right knee pain, but also appeared to be confused as she was waiting for her sister in the waiting room although arrived via EMS.  Patient reports that she started to feel nauseous while in the waiting room as well.  No vomiting.  Denies any diarrhea or constipation.  She denies any fevers.  History is limited given the patient's dementia and altered and is however.   Knee Pain      Home Medications Prior to Admission medications   Medication Sig Start Date End Date Taking? Authorizing Provider  albuterol (VENTOLIN HFA) 108 (90 Base) MCG/ACT inhaler Inhale 2 puffs into the lungs 4 (four) times daily as needed for wheezing or shortness of breath.    [provider]  amLODipine (NORVASC) 5 MG tablet Take 5 mg by mouth daily. 06/09/22   [provider]  cetirizine (ZYRTEC) 10 MG tablet Take 10 mg by mouth daily.    [provider]  clotrimazole (LOTRIMIN) 1 % external solution Apply 1 Application topically See admin instructions. Apply to the scalp daily    [provider]  docusate sodium (COLACE) 100 MG capsule Take 100 mg by mouth in the morning.    [provider]  folic acid (FOLVITE) 1 MG tablet Take 1 mg by mouth See admin instructions. Take 1 mg by mouth once a day on Sun/Mon/Tues/Thurs/Fri/Sat- hold on Wednesdays when taking Methotrexate    [provider]  loperamide (IMODIUM A-D) 2 MG capsule Take 2-4 mg by mouth daily as needed for diarrhea or loose stools.    [provider]  omeprazole (PRILOSEC) 20 MG capsule Take 20 mg by mouth daily as needed (for acid reflux or gastric issues).     [provider]  PARoxetine (PAXIL) 30 MG tablet Take 30 mg by mouth daily. 06/09/22   [provider]  triamcinolone cream (KENALOG) 0.5 % Apply 1 Application topically See admin instructions. Apply to affected areas of the skin 2 times a day as needed for psoriasis and a small amount behind both ears    [provider]      Allergies    Patient has no known allergies.    Review of Systems   Review of Systems  Unable to perform ROS: Dementia    Physical Exam Updated Vital Signs BP 133/72 (BP Location: Right Arm)   Pulse 85   Temp 98 F (36.7 C) (Oral)   Resp 15   SpO2 100%  Physical Exam Vitals and nursing note reviewed.  Constitutional:      General: She is not in acute distress.    Appearance: Normal appearance. She is not ill-appearing or toxic-appearing.  HENT:     Head: Normocephalic and atraumatic.  Eyes:     General: No scleral icterus. Cardiovascular:     Rate and Rhythm: Normal rate and regular rhythm.  Pulmonary:     Effort: Pulmonary effort is normal.     Breath sounds: Normal breath sounds.  Abdominal:     General: Bowel sounds are normal. There is no distension.     Palpations: Abdomen is soft.     Tenderness: There is abdominal tenderness.  Comments: Diffuse lower abdominal tenderness palpation.  Patient has erythema and scaling rash noted to the lower abdomen and inguinal creases.  Musculoskeletal:        General: Tenderness present. No deformity.     Cervical back: Normal range of motion.     Comments: Diffuse tenderness noted over the right knee.  Some mild swelling noted medially.  No overlying rash or erythema noted to the area.  Patient has flexion and extension still present without pain.  No focal tenderness.  Compartments are soft.  Palpable pulses.  No swelling noted to the lower legs.  Sensation intact.  Skin:    General: Skin is warm and dry.     Findings: Rash present.     Comments: Scaling rash noted throughout  the back and on the buttocks as well as in between the thighs and on the abdomen the vagina.  No skin sloughing noted.  No discharge noted.  Neurological:     General: No focal deficit present.     Mental Status: She is alert. Mental status is at baseline.           ED Results / Procedures / Treatments   Labs (all labs ordered are listed, but only abnormal results are displayed) Labs Reviewed  CBC WITH DIFFERENTIAL/PLATELET - Abnormal; Notable for the following components:      Result Value   WBC 21.9 (*)    HCT 49.5 (*)    MCV 103.6 (*)    MCHC 29.3 (*)    Platelets 436 (*)    Neutro Abs 16.4 (*)    Abs Immature Granulocytes 0.12 (*)    All other components within normal limits  URINALYSIS, ROUTINE W REFLEX MICROSCOPIC  AMMONIA  COMPREHENSIVE METABOLIC PANEL    EKG None  Radiology CT ABDOMEN PELVIS W CONTRAST  Result Date: 08/26/2022 CLINICAL DATA:  Lower abdominal pain. EXAM: CT ABDOMEN AND PELVIS WITH CONTRAST TECHNIQUE: Multidetector CT imaging of the abdomen and pelvis was performed using the standard protocol following bolus administration of intravenous contrast. RADIATION DOSE REDUCTION: This exam was performed according to the departmental dose-optimization program which includes automated exposure control, adjustment of the mA and/or kV according to patient size and/or use of iterative reconstruction technique. CONTRAST:  138m OMNIPAQUE IOHEXOL 300 MG/ML  SOLN COMPARISON:  CT abdomen and pelvis 07/07/2022 FINDINGS: Lower chest: No acute abnormality. Hepatobiliary: No focal liver abnormality is seen. No gallstones, gallbladder wall thickening, or biliary dilatation. Pancreas: Unremarkable. No pancreatic ductal dilatation or surrounding inflammatory changes. Spleen: Normal in size without focal abnormality. Adrenals/Urinary Tract: There are calcifications in the left adrenal gland, unchanged, likely related to prior hemorrhage or infection. Right adrenal gland is  within normal limits. Left-sided ureteral stent is in place. There is no hydronephrosis. Cortical and peripelvic cysts are seen throughout the left kidney measuring up to 3.9 cm. Right kidney is within normal limits. Bilateral bladder diverticula are present left-sided bladder diverticulum is mildly hyperdense and unchanged. Nodule can not be excluded in this region. No bladder calculi. There is mild inflammatory stranding surrounding the bladder. Stomach/Bowel: Stomach is within normal limits. No evidence of bowel wall thickening, distention, or inflammatory changes. There is diffuse colonic diverticulosis. The appendix is not seen. Vascular/Lymphatic: Aortic atherosclerosis. No enlarged abdominal or pelvic lymph nodes. Reproductive: Calcified uterine fibroids are present measuring up to 4 cm. Adnexa are unremarkable. Other: There is a fat containing umbilical hernia. There is no free fluid. Musculoskeletal: Multilevel degenerative changes affect the spine. The bones  are osteopenic. IMPRESSION: 1. Mild inflammatory stranding surrounding the bladder worrisome for cystitis. 2. Left-sided ureteral stent in place. No hydronephrosis. 3. Bilateral bladder diverticula are present. There is a left-sided bladder diverticulum which is mildly hyperdense and unchanged. Nodule can not be excluded in this region. 4. Colonic diverticulosis. 5. Uterine fibroids. Aortic Atherosclerosis (ICD10-I70.0). Electronically Signed   By: Ronney Asters M.D.   On: 08/26/2022 18:29   DG Knee Complete 4 Views Right  Result Date: 08/26/2022 CLINICAL DATA:  Right knee pain this morning. Patient fell a few weeks ago. EXAM: RIGHT KNEE - COMPLETE 4+ VIEW COMPARISON:  Radiographs 07/06/2022. FINDINGS: The bones appear diffusely demineralized. There is no evidence of acute fracture or dislocation. Tricompartmental degenerative changes are present with associated meniscal chondrocalcinosis and a small nonspecific knee joint effusion. IMPRESSION: No  evidence of acute fracture or dislocation. Tricompartmental degenerative changes and small nonspecific knee joint effusion. Electronically Signed   By: Richardean Sale M.D.   On: 08/26/2022 13:49    Procedures Procedures   Medications Ordered in ED Medications - No data to display  ED Course/ Medical Decision Making/ A&P                           Medical Decision Making Amount and/or Complexity of Data Reviewed Labs: ordered. Radiology: ordered.  Risk Prescription drug management. Decision regarding hospitalization.   81 year old female presents emergency room today for evaluation of right knee pain but also appear to be altered.  Given this, I did order labs.  She was vital signs are unremarkable.  Patient normotensive, afebrile, normal pulse rate, satting well on room air without increased work of breathing.  Physical exam as noted above.  Differential diagnosis includes is limited to sepsis versus UTI versus septic arthritis versus contusion versus arthritis.  I independently reviewed and interpreted the patient's labs.  CMP shows mildly elevated glucose at 142 with a mildly decreased albumin at 3.3.  Otherwise no electrolyte or LFT abnormality.  CBC shows sniffily elevated white blood cell count of 21.9.  Hemoglobin within normal limits.  Patient does have an elevated platelet count of 436 as well.  Urinalysis shows turbid urine with moderate amount of hemoglobin.  There is under protein, large leuks with greater than 50 red blood cells and greater than 50 white blood cells with many bacteria.  Consistent with UTI.  Ammonia normal.  Lactic acid elevated at 2.9.  X-ray imaging of the right knee shows no evidence of acute fracture or dislocation. Tricompartmental degenerative changes and small nonspecific knee joint effusion.  CT imaging of abdomen pelvis shows 1. Mild inflammatory stranding surrounding the bladder worrisome for cystitis. 2. Left-sided ureteral stent in place. No  hydronephrosis. 3. Bilateral bladder diverticula are present. There is a left-sided bladder diverticulum which is mildly hyperdense and unchanged. Nodule can not be excluded in this region. 4. Colonic diverticulosis. 5. Uterine fibroids. Aortic Atherosclerosis.  Patient has diffuse tenderness to the right knee with minimal swelling.  There is no overlying erythema or rash.  No point tenderness to palpation.  She can still flex and extend the knee with some pain.  Compartments are soft.  Palpable pulses.  I doubt any septic arthritis at this time.  Unlikely the patient's white count is coming from her UTI.  Spoke with Jeani Hawking, her caretaker, who will contact the sister. She informs that the patient was recently just discharged from [facility] back to the independent living facility and she  reports that her skin was not taking care of while she was at [facility].  She denies a history of psoriasis.  Will admit the patient for urinary tract infection.  I discussed this case with my attending physician who cosigned this note including patient's presenting symptoms, physical exam, and planned diagnostics and interventions. Attending physician stated agreement with plan or made changes to plan which were implemented.   Final Clinical Impression(s) / ED Diagnoses Final diagnoses:  Acute cystitis with hematuria  Rash  Acute pain of right knee    Rx / DC Orders ED Discharge Orders     None         Sherrell Puller, PA-C 08/26/22 2304    Hayden Rasmussen, MD 08/27/22 1120

## 2022-08-26 NOTE — H&P (Signed)
History and Physical    Patient: Danielle Dixon SWF:[id number] DOB: 1941-06-06 DOA: 08/26/2022 DOS: the patient was seen and examined on 08/26/2022 PCP: Glenis Smoker, MD  Patient coming from: SNF  Chief Complaint:  Chief Complaint  Patient presents with   Knee Pain   HPI: Danielle Dixon is a 81 y.o. female with medical history significant of Alzheimer's dementia, depression, essential hypertension, psoriasis, rheumatoid arthritis, COPD, who lives at [facility] presented initially with right knee pain and swelling.  She normally can walk with a walker.  She was seen and evaluated and has some tenderness on the right knee.  Patient also complained of dysuria and generalized weakness.  Also worsening confusion.  She was seen and evaluated.  Appears to have symptoms consistent with UTI.  Patient also has low-grade temperature with a white count 21.9 and lactic acid 2.9.  Appears to be possible early sepsis due to UTI.  She is being admitted for further evaluation and treatment.  Patient denied any flank pain.  She is not a good historian.  Review of Systems: As mentioned in the history of present illness. All other systems reviewed and are negative. Past Medical History:  Diagnosis Date   Alzheimer's dementia ([place])    per records from [facility]   Anemia    Anxiety    Arthritis    ra   Constipation    COPD (chronic obstructive pulmonary disease) ([place])    Dementia ([place])    Dementia ([place])    Depression    Dyspnea 06/25/2013   Hypertension    Leukocytosis    Leukocytosis, unspecified 06/25/2013   Leukocytosis, unspecified 06/25/2013   Psoriasis    Rheumatoid arthritis ([place])    Unspecified vitamin D deficiency 06/25/2013   Vitamin B12 deficiency    Past Surgical History:  Procedure Laterality Date   APPENDECTOMY     CYSTOSCOPY W/ URETERAL STENT PLACEMENT Bilateral 07/10/2019   Procedure: CYSTOSCOPY WITH RETROGRADE PYELOGRAM AND BILATERAL  URETERAL STENT PLACEMENT;  Surgeon: Lucas Mallow, MD;  Location: [facility];  Service: Urology;  Laterality: Bilateral;   CYSTOSCOPY W/ URETERAL STENT PLACEMENT Left 07/07/2022   Procedure: CYSTOSCOPY WITH RETROGRADE PYELOGRAM/URETERAL STENT PLACEMENT;  Surgeon: Festus Aloe, MD;  Location: [facility];  Service: Urology;  Laterality: Left;   CYSTOSCOPY/URETEROSCOPY/HOLMIUM LASER/STENT PLACEMENT Bilateral 07/29/2019   Procedure: CYSTOSCOPY LEFT RETROGRADE PYELOGRAM LEFT URETEROSCOPY HOLMIUM LASER LEFT STENT PLACEMENT POSSIBLE RIGHT URETEROSCOPY LASER LITHOTRIPSY AND STENT;  Surgeon: Lucas Mallow, MD;  Location: [facility];  Service: Urology;  Laterality: Bilateral;   IR URETERAL STENT RIGHT NEW ACCESS W/O SEP NEPHROSTOMY CATH  09/18/2019   knee replacment Left    NEPHROLITHOTOMY Right 09/18/2019   Procedure: NEPHROLITHOTOMY PERCUTANEOUS;  Surgeon: Lucas Mallow, MD;  Location: [facility];  Service: Urology;  Laterality: Right;   TOE SURGERY     Social History:  reports that she has never smoked. She has never used smokeless tobacco. She reports that she does not drink alcohol and does not use drugs.  No Known Allergies  Family History  Problem Relation Age of Onset   Cancer Mother        breast cancer   Cancer Father        lung    Prior to Admission medications   Medication Sig Start Date End Date Taking? Authorizing Provider  albuterol (VENTOLIN HFA) 108 (90 Base) MCG/ACT inhaler Inhale 2 puffs into the lungs 4 (four) times daily as needed for  wheezing or shortness of breath.    [provider]  amLODipine (NORVASC) 5 MG tablet Take 5 mg by mouth daily. 06/09/22   [provider]  cetirizine (ZYRTEC) 10 MG tablet Take 10 mg by mouth daily.    [provider]  clotrimazole (LOTRIMIN) 1 % external solution Apply 1 Application topically See admin instructions. Apply to the scalp daily    [provider]  docusate sodium (COLACE) 100 MG capsule Take 100  mg by mouth in the morning.    [provider]  folic acid (FOLVITE) 1 MG tablet Take 1 mg by mouth See admin instructions. Take 1 mg by mouth once a day on Sun/Mon/Tues/Thurs/Fri/Sat- hold on Wednesdays when taking Methotrexate    [provider]  loperamide (IMODIUM A-D) 2 MG capsule Take 2-4 mg by mouth daily as needed for diarrhea or loose stools.    [provider]  omeprazole (PRILOSEC) 20 MG capsule Take 20 mg by mouth daily as needed (for acid reflux or gastric issues).    [provider]  PARoxetine (PAXIL) 30 MG tablet Take 30 mg by mouth daily. 06/09/22   [provider]  triamcinolone cream (KENALOG) 0.5 % Apply 1 Application topically See admin instructions. Apply to affected areas of the skin 2 times a day as needed for psoriasis and a small amount behind both ears    [provider]    Physical Exam: Vitals:   08/26/22 1630 08/26/22 1712 08/26/22 1830 08/26/22 1930  BP: (!) 157/75  122/65   Pulse: 94  92 82  Resp: 16  16   Temp:  99.5 F (37.5 C)    TempSrc:  Rectal    SpO2: 97%  97% 94%   Constitutional: Confused, weak, NAD, calm, comfortable Eyes: PERRL, lids and conjunctivae normal ENMT: Mucous membranes are dry. Posterior pharynx clear of any exudate or lesions.Normal dentition.  Neck: normal, supple, no masses, no thyromegaly Respiratory: clear to auscultation bilaterally, no wheezing, no crackles. Normal respiratory effort. No accessory muscle use.  Cardiovascular: Sinus tachycardia, no murmurs / rubs / gallops. No extremity edema. 2+ pedal pulses. No carotid bruits.  Abdomen: no tenderness, no masses palpated. No hepatosplenomegaly. Bowel sounds positive.  Musculoskeletal: Good range of motion, no joint swelling or tenderness, Skin: no rashes, lesions, ulcers. No induration Neurologic: CN 2-12 grossly intact. Sensation intact, DTR normal. Strength 5/5 in all 4.  Psychiatric: Confused, awake and alert  Data  Reviewed:  Urinalysis consistent with UTI, white count 21.9, hemoglobin 14.5, platelets 436, lactic acid 2.9.  Glucose 142 CT abdomen pelvis showed mild inflammatory stranding surrounding the bladder worrisome for cystitis.  There is left-sided ureteral stent in place with no hydronephrosis.  Bilateral bilateral bladder diverticula noted.  X-ray of the right knee shows no evidence of acute fracture.  There is evidence of tricompartmental degenerative change.  Assessment and Plan:  #1 acute UTI: Acute cystitis with hematuria.  Admit the patient.  Initiate IV Rocephin.  Urine and blood cultures obtained.  Follow closely.  Once patient is better and we have cultures back patient can be discharged back to facility to complete treatment.  Patient has ureteric stent.  This may mean complex UTI therefore with the hardware in place.  #2 Alzheimer's dementia: Seems to be at baseline.  A little more confused.  #3 right knee swelling: Appears to be osteoarthritis of the right knee.  Continue pain management and PT will be ordered.  #4 morbid obesity: Dietary counseling  #  5 leukocytosis: Secondary to UTI.  Continue antibiotics  #6 essential hypertension: Continue blood pressure medication and monitoring.  #7 history of renal calculi: Patient has stents in place.     Advance Care Planning:   Code Status: Prior full code  Consults: None  Family Communication: No family at bedside  Severity of Illness: The appropriate patient status for this patient is INPATIENT. Inpatient status is judged to be reasonable and necessary in order to provide the required intensity of service to ensure the patient's safety. The patient's presenting symptoms, physical exam findings, and initial radiographic and laboratory data in the context of their chronic comorbidities is felt to place them at high risk for further clinical deterioration. Furthermore, it is not anticipated that the patient will be medically stable for  discharge from the hospital within 2 midnights of admission.   * I certify that at the point of admission it is my clinical judgment that the patient will require inpatient hospital care spanning beyond 2 midnights from the point of admission due to high intensity of service, high risk for further deterioration and high frequency of surveillance required.*  AuthorBarbette Merino, MD 08/26/2022 7:59 PM  For on call review [email or web address].

## 2022-08-27 DIAGNOSIS — Z960 Presence of urogenital implants: Secondary | ICD-10-CM

## 2022-08-27 DIAGNOSIS — F0280 Dementia in other diseases classified elsewhere without behavioral disturbance: Secondary | ICD-10-CM

## 2022-08-27 DIAGNOSIS — G309 Alzheimer's disease, unspecified: Secondary | ICD-10-CM

## 2022-08-27 DIAGNOSIS — N390 Urinary tract infection, site not specified: Secondary | ICD-10-CM

## 2022-08-27 DIAGNOSIS — I10 Essential (primary) hypertension: Secondary | ICD-10-CM

## 2022-08-27 DIAGNOSIS — M1711 Unilateral primary osteoarthritis, right knee: Secondary | ICD-10-CM | POA: Diagnosis not present

## 2022-08-27 DIAGNOSIS — N1831 Chronic kidney disease, stage 3a: Secondary | ICD-10-CM | POA: Diagnosis not present

## 2022-08-27 DIAGNOSIS — M069 Rheumatoid arthritis, unspecified: Secondary | ICD-10-CM

## 2022-08-27 LAB — COMPREHENSIVE METABOLIC PANEL
ALT: 14 U/L (ref 0–44)
AST: 14 U/L — ABNORMAL LOW (ref 15–41)
Albumin: 3 g/dL — ABNORMAL LOW (ref 3.5–5.0)
Alkaline Phosphatase: 101 U/L (ref 38–126)
Anion gap: 11 (ref 5–15)
BUN: 11 mg/dL (ref 8–23)
CO2: 25 mmol/L (ref 22–32)
Calcium: 9 mg/dL (ref 8.9–10.3)
Chloride: 104 mmol/L (ref 98–111)
Creatinine, Ser: 0.9 mg/dL (ref 0.44–1.00)
GFR, Estimated: >60 mL/min (ref >60)
Glucose, Bld: 105 mg/dL — ABNORMAL HIGH (ref 70–99)
Potassium: 4 mmol/L (ref 3.5–5.1)
Sodium: 140 mmol/L (ref 135–145)
Total Bilirubin: 0.3 mg/dL (ref 0.3–1.2)
Total Protein: 6.7 g/dL (ref 6.5–8.1)

## 2022-08-27 LAB — CBC
HCT: 41.8 % (ref 36.0–46.0)
Hemoglobin: 12.6 g/dL (ref 12.0–15.0)
MCH: 29.2 pg (ref 26.0–34.0)
MCHC: 30.1 g/dL (ref 30.0–36.0)
MCV: 97 fL (ref 80.0–100.0)
Platelets: 503 10*3/uL — ABNORMAL HIGH (ref 150–400)
RBC: 4.31 MIL/uL (ref 3.87–5.11)
RDW: 14.7 % (ref 11.5–15.5)
WBC: 18.5 10*3/uL — ABNORMAL HIGH (ref 4.0–10.5)
nRBC: 0 % (ref 0.0–0.2)

## 2022-08-27 LAB — LACTIC ACID, PLASMA: Lactic Acid, Venous: 1.2 mmol/L (ref 0.5–1.9)

## 2022-08-27 MED ORDER — POLYETHYLENE GLYCOL 3350 17 G PO PACK: 17.0000 g | PACK | Freq: Every day | ORAL | Status: DC | PRN

## 2022-08-27 MED ORDER — AMLODIPINE BESYLATE 5 MG PO TABS
5.0000 mg | ORAL_TABLET | Freq: Every day | ORAL | Status: DC
  Administered 2022-08-28 – 2022-08-31 (×4): 5 mg via ORAL
  Filled 2022-08-27 (×4): qty 1

## 2022-08-27 MED ORDER — MELATONIN 5 MG PO TABS: 10.0000 mg | ORAL_TABLET | Freq: Every evening | ORAL | Status: DC | PRN

## 2022-08-27 MED ORDER — SODIUM CHLORIDE 0.9 % IV SOLN: 1.0000 g | Freq: Four times a day (QID) | INTRAVENOUS | Status: DC

## 2022-08-27 MED ORDER — SODIUM CHLORIDE 0.9 % IV SOLN
3.0000 g | Freq: Four times a day (QID) | INTRAVENOUS | Status: DC
  Administered 2022-08-27 – 2022-08-29 (×8): 3 g via INTRAVENOUS
  Filled 2022-08-27 (×10): qty 8

## 2022-08-27 MED ORDER — HYDRALAZINE HCL 20 MG/ML IJ SOLN: 10.0000 mg | Freq: Four times a day (QID) | INTRAMUSCULAR | Status: DC | PRN

## 2022-08-27 MED ORDER — AMLODIPINE BESYLATE 5 MG PO TABS: 5.0000 mg | ORAL_TABLET | Freq: Every day | ORAL | Status: DC

## 2022-08-27 NOTE — Assessment & Plan Note (Signed)
Strict intake and output monitoring Creatinine near baseline Minimizing nephrotoxic agents as much as possible Serial chemistries to monitor renal function and electrolytes  

## 2022-08-27 NOTE — Assessment & Plan Note (Addendum)
Seemingly chronic and improved. likely possibly secondary to osteoarthritis or known history of rheumatoid arthritis.

## 2022-08-27 NOTE — Assessment & Plan Note (Signed)
Patient does not seem to be on chronic immunomodulating agents for this. Outpatient follow-up

## 2022-08-27 NOTE — Assessment & Plan Note (Signed)
Longstanding known history of dementia and cognitive deficit Minimizing uncomfortable stimuli Minimizing mood altering and sedating agents Frequent redirection by staff Fall precautions

## 2022-08-27 NOTE — Progress Notes (Signed)
PROGRESS NOTE   Danielle Dixon  NIO:[id number] DOB: 1941-07-18 DOA: 08/26/2022 PCP: Glenis Smoker, MD   Date of Service: the patient was seen and examined on 08/27/2022  Brief Narrative:  81 y.o. female with medical history significant of Alzheimer's dementia, depression, essential hypertension, psoriasis, rheumatoid arthritis, COPD, who lives at [facility] presented initially with right knee pain and swelling.   Upon evaluation in the emergency department the patient was additionally found to be more confused than her baseline.  Urinalysis was suggestive of urinary tract infection with substantial leukocytosis.  CT imaging the abdomen pelvis revealed fat stranding around the bladder consistent with infection.  Patient was initiated on intravenous antibiotics and the hospitalist group was then called to assess the patient for admission to the hospital.   Assessment and Plan: * Complicated UTI (urinary tract infection) Urinalysis suggestive of urinary tract infection with CT evidence of cystitis and substantial leukocytosis of 21.9 on arrival on the setting of known left ureteral stent Treating patient both with intravenous ceftriaxone as well as intravenous vancomycin due to previous urine cultures on 10/25 growing out Enterococcus faecalis If patient fails to clinically improve will consider consulting urology for stent removal, patient follows with Dr. Gloriann Loan in the outpatient setting Following blood and urine cultures  Osteoarthritis of right knee Patient initially presented with right knee pain on 12/15 Patient reports that she has chronic right knee pain that has been ongoing for several months Notable crepitus on exam with minimal to no effusion, likely possibly secondary to osteoarthritis or known history of rheumatoid arthritis.  Chronic kidney disease, stage 3a (HCC) Strict intake and output monitoring Creatinine near baseline Minimizing  nephrotoxic agents as much as possible Serial chemistries to monitor renal function and electrolytes   Essential hypertension Resume patients home regimen of oral antihypertensives Titrate antihypertensive regimen as necessary to achieve adequate BP control PRN intravenous antihypertensives for excessively elevated blood pressure   Ureteral stent present Placed during hospitalization 06/2022 for obstructing ureteral stone and pyelonephritis Patient is to follow-up as an outpatient with Dr. Gloriann Loan concerning this Patient clinically deteriorates will obtain urology consultation during this hospitalization for potential removal  Rheumatoid arthritis [facility]) Patient does not seem to be on chronic immunomodulating agents for this. Outpatient follow-up  Alzheimer's dementia without behavioral disturbance ([place]) Longstanding known history of dementia and cognitive deficit Minimizing uncomfortable stimuli Minimizing mood altering and sedating agents Frequent redirection by staff Fall precautions      Subjective:  Patient complaining of generalized weakness.  Patient additionally complaining of associated abdominal pain, sore in quality, located in the lower abdomen, moderate in intensity.  Physical Exam:  Vitals:   08/27/22 0812 08/27/22 1146 08/27/22 1218 08/27/22 1827  BP: (!) 131/59  (!) 143/65 (!) 149/66  Pulse: 86  82 81  Resp: '18  17 17  '$ Temp: 98.1 F (36.7 C) 98.1 F (36.7 C) 97.9 F (36.6 C) 98.5 F (36.9 C)  TempSrc: Oral   Oral  SpO2: 96%  97% 93%  Weight:      Height:        Constitutional: Awake alert and oriented x1, no associated distress.   Skin: no rashes, no lesions, good skin turgor noted. Eyes: Pupils are equally reactive to light.  No evidence of scleral icterus or conjunctival pallor.  ENMT: Dry mucous membranes noted.  Posterior pharynx clear of any exudate or lesions.   Respiratory: clear to auscultation bilaterally, no wheezing, no crackles. Normal  respiratory effort. No accessory muscle  use.  Cardiovascular: Regular rate and rhythm, no murmurs / rubs / gallops. No extremity edema. 2+ pedal pulses. No carotid bruits.  Abdomen: Notable bowel no tenderness, worst in the suprapubic region.  Abdomen is soft.  No evidence of intra-abdominal masses.  Positive bowel sounds noted in all quadrants.   Musculoskeletal: No joint deformity upper and lower extremities. Good ROM, no contractures. Normal muscle tone.    Data Reviewed:  I have personally reviewed and interpreted labs, imaging.  Significant findings are   CBC: Recent Labs  Lab 08/26/22 1412 08/27/22 0008  WBC 21.9* 18.5*  NEUTROABS 16.4*  --   HGB 14.5 12.6  HCT 49.5* 41.8  MCV 103.6* 97.0  PLT 436* 128*   Basic Metabolic Panel: Recent Labs  Lab 08/26/22 1652 08/27/22 0008  NA 140 140  K 3.9 4.0  CL 105 104  CO2 25 25  GLUCOSE 142* 105*  BUN 14 11  CREATININE 0.99 0.90  CALCIUM 9.6 9.0   GFR: Estimated Creatinine Clearance: 54.5 mL/min (by C-G formula based on SCr of 0.9 mg/dL). Liver Function Tests: Recent Labs  Lab 08/26/22 1652 08/27/22 0008  AST 19 14*  ALT 18 14  ALKPHOS 116 101  BILITOT 0.4 0.3  PROT 7.6 6.7  ALBUMIN 3.3* 3.0*      Code Status:  Full code.  Code status decision has been confirmed with: daughter via phone coversation    Severity of Illness:  The appropriate patient status for this patient is INPATIENT. Inpatient status is judged to be reasonable and necessary in order to provide the required intensity of service to ensure the patient's safety. The patient's presenting symptoms, physical exam findings, and initial radiographic and laboratory data in the context of their chronic comorbidities is felt to place them at high risk for further clinical deterioration. Furthermore, it is not anticipated that the patient will be medically stable for discharge from the hospital within 2 midnights of admission.   * I certify that at the  point of admission it is my clinical judgment that the patient will require inpatient hospital care spanning beyond 2 midnights from the point of admission due to high intensity of service, high risk for further deterioration and high frequency of surveillance required.*  Time spent:  55 minutes  Author:  Vernelle Emerald MD  08/27/2022 8:30 PM

## 2022-08-27 NOTE — Assessment & Plan Note (Addendum)
Medically improved on Unasyn which patient has received day 3 of 7 Treating for a total of 7 days considering complex nature of infection with indwelling stent Final culture results revealing Enterococcus faecalis that is pansensitive, similar to prior culture on 10/25. Will continue on intravenous Unasyn and transition to oral ampicillin if a SNF bed has been obtained prior to completion of antibiotic course. Will transition antibiotic therapy to ampicillin Currently on intravenous Unasyn due to history of Enterococcus faecalis in the past on 10/25 culture.

## 2022-08-27 NOTE — Assessment & Plan Note (Addendum)
Placed during hospitalization 06/2022 for obstructing ureteral stone and pyelonephritis Patient is to follow-up as an outpatient with Dr. Gloriann Loan concerning this Patient clinically deteriorates will obtain urology consultation during this hospitalization for potential removal

## 2022-08-27 NOTE — Assessment & Plan Note (Signed)
.   Resume patients home regimen of oral antihypertensives . Titrate antihypertensive regimen as necessary to achieve adequate BP control . PRN intravenous antihypertensives for excessively elevated blood pressure   

## 2022-08-27 NOTE — Hospital Course (Signed)
81 y.o. female with medical history significant of Alzheimer's dementia, depression, essential hypertension, psoriasis, rheumatoid arthritis, COPD, who lives at [facility] presented initially with right knee pain and swelling.   Upon evaluation in the emergency department the patient was additionally found to be more confused than her baseline.  Urinalysis was suggestive of urinary tract infection with substantial leukocytosis.  CT imaging the abdomen pelvis revealed fat stranding around the bladder consistent with infection.  Patient was initiated on intravenous antibiotics and the hospitalist group was then called to assess the patient for admission to the hospital.  Patient was treated with intravenous antibiotic therapy initially with ceftriaxone but quickly transition to intravenous Unasyn once it was determined the patient has a history of Enterococcus faecalis urinary tract infection.  Urine culture on this hospitalization ended up growing out Enterococcus faecalis once again.  Presence of left ureteral stent precipitated the need to treat the patient with a total of 7 days of antibiotics considering complicated nature of urinary tract infection.  Patient clinically improved with resolution of leukocytosis and weakness.  PT/OT recommending SNF placement.

## 2022-08-28 DIAGNOSIS — M1711 Unilateral primary osteoarthritis, right knee: Secondary | ICD-10-CM | POA: Diagnosis not present

## 2022-08-28 DIAGNOSIS — I10 Essential (primary) hypertension: Secondary | ICD-10-CM | POA: Diagnosis not present

## 2022-08-28 DIAGNOSIS — N390 Urinary tract infection, site not specified: Secondary | ICD-10-CM | POA: Diagnosis not present

## 2022-08-28 DIAGNOSIS — N1831 Chronic kidney disease, stage 3a: Secondary | ICD-10-CM | POA: Diagnosis not present

## 2022-08-28 LAB — COMPREHENSIVE METABOLIC PANEL
ALT: 13 U/L (ref 0–44)
AST: 13 U/L — ABNORMAL LOW (ref 15–41)
Albumin: 2.8 g/dL — ABNORMAL LOW (ref 3.5–5.0)
Alkaline Phosphatase: 87 U/L (ref 38–126)
Anion gap: 8 (ref 5–15)
BUN: 6 mg/dL — ABNORMAL LOW (ref 8–23)
CO2: 27 mmol/L (ref 22–32)
Calcium: 8.7 mg/dL — ABNORMAL LOW (ref 8.9–10.3)
Chloride: 109 mmol/L (ref 98–111)
Creatinine, Ser: 0.79 mg/dL (ref 0.44–1.00)
GFR, Estimated: >60 mL/min (ref >60)
Glucose, Bld: 106 mg/dL — ABNORMAL HIGH (ref 70–99)
Potassium: 3.7 mmol/L (ref 3.5–5.1)
Sodium: 144 mmol/L (ref 135–145)
Total Bilirubin: 0.3 mg/dL (ref 0.3–1.2)
Total Protein: 6.1 g/dL — ABNORMAL LOW (ref 6.5–8.1)

## 2022-08-28 LAB — CBC WITH DIFFERENTIAL/PLATELET
Abs Immature Granulocytes: 0.03 10*3/uL (ref 0.00–0.07)
Basophils Absolute: 0.1 10*3/uL (ref 0.0–0.1)
Basophils Relative: 1 %
Eosinophils Absolute: 0.4 10*3/uL (ref 0.0–0.5)
Eosinophils Relative: 4 %
HCT: 38.3 % (ref 36.0–46.0)
Hemoglobin: 11.6 g/dL — ABNORMAL LOW (ref 12.0–15.0)
Immature Granulocytes: 0 %
Lymphocytes Relative: 25 %
Lymphs Abs: 2.6 10*3/uL (ref 0.7–4.0)
MCH: 30.1 pg (ref 26.0–34.0)
MCHC: 30.3 g/dL (ref 30.0–36.0)
MCV: 99.2 fL (ref 80.0–100.0)
Monocytes Absolute: 0.6 10*3/uL (ref 0.1–1.0)
Monocytes Relative: 6 %
Neutro Abs: 6.6 10*3/uL (ref 1.7–7.7)
Neutrophils Relative %: 64 %
Platelets: 400 10*3/uL (ref 150–400)
RBC: 3.86 MIL/uL — ABNORMAL LOW (ref 3.87–5.11)
RDW: 14.9 % (ref 11.5–15.5)
WBC: 10.3 10*3/uL (ref 4.0–10.5)
nRBC: 0 % (ref 0.0–0.2)

## 2022-08-28 LAB — MAGNESIUM: Magnesium: 2 mg/dL (ref 1.7–2.4)

## 2022-08-28 MED ORDER — FLUCONAZOLE IN SODIUM CHLORIDE 200-0.9 MG/100ML-% IV SOLN
200.0000 mg | INTRAVENOUS | Status: DC
  Filled 2022-08-28: qty 100

## 2022-08-28 NOTE — Plan of Care (Signed)
  Problem: Education: Goal: Knowledge of General Education information will improve Description: Including pain rating scale, medication(s)/side effects and non-pharmacologic comfort measures Outcome: Progressing   Problem: Clinical Measurements: Goal: Will remain free from infection Outcome: Progressing   Problem: Nutrition: Goal: Adequate nutrition will be maintained Outcome: Progressing   

## 2022-08-28 NOTE — NC FL2 (Signed)
[place] LEVEL OF CARE FORM     IDENTIFICATION  Patient Name: Yashika Mask Birthdate: 08/28/1941 Sex: female Admission Date (Current Location): 08/26/2022  [facility] and [place] Number:  Herbalist and Address:  [facility] [place],  [place], [place]      Provider Number: [id number]  Attending Physician Name and Address:  Vernelle Emerald, MD  Relative Name and Phone Number:  sister, [name] [phone]    Current Level of Care: Hospital Recommended Level of Care: [place] Prior Approval Number:    Date Approved/Denied:   PASRR Number: [id number]  Discharge Plan: SNF    Current Diagnoses: Patient Active Problem List   Diagnosis Date Noted   Osteoarthritis of right knee 08/27/2022   Chronic kidney disease, stage 3a ([place]) 08/27/2022   Ureteral stent present 08/27/2022   Alzheimer's dementia without behavioral disturbance ([place]) 01/77/9390   Complicated UTI (urinary tract infection) 08/26/2022   Obesity (BMI 30-39.9) 07/09/2022   Essential hypertension 07/09/2022   Acute kidney injury superimposed on chronic kidney disease ([place]) 07/07/2022   Renal calculi 09/18/2019   Pressure injury of skin 07/14/2019   Pyohydronephrosis 07/10/2019   Sepsis ([place]) 30/05/2329   Acute metabolic encephalopathy 07/62/2633   Rheumatoid arthritis ([place]) 07/10/2019   Dyspnea 06/25/2013   Thrombocytosis 06/25/2013   Leukocytosis, unspecified 06/25/2013   Need for prophylactic vaccination and inoculation against influenza 06/25/2013   Unspecified vitamin D deficiency 06/25/2013    Orientation RESPIRATION BLADDER Height & Weight        Normal Incontinent, External catheter (currently with purewick) Weight: 198 lb 6.6 oz (90 kg) Height:  '5\' 6"'$  (167.6 cm)  BEHAVIORAL SYMPTOMS/MOOD NEUROLOGICAL BOWEL NUTRITION STATUS      Incontinent    AMBULATORY STATUS COMMUNICATION OF NEEDS Skin   Limited Assist Verbally Other  (Comment) (rash on back and thighs)                       Personal Care Assistance Level of Assistance  Bathing, Dressing Bathing Assistance: Limited assistance   Dressing Assistance: Limited assistance     Functional Limitations Info  Sight, Hearing, Speech Sight Info: Adequate Hearing Info: Impaired Speech Info: Adequate    SPECIAL CARE FACTORS FREQUENCY  PT (By licensed PT), OT (By licensed OT)     PT Frequency: 5x/wk OT Frequency: 5x/wk            Contractures Contractures Info: Not present    Additional Factors Info  Code Status, Allergies Code Status Info: Full Allergies Info: NKDA           Current Medications (08/28/2022):  This is the current hospital active medication list Current Facility-Administered Medications  Medication Dose Route Frequency Provider Last Rate Last Admin   acetaminophen (TYLENOL) tablet 650 mg  650 mg Oral Q6H PRN Elwyn Reach, MD       Or   acetaminophen (TYLENOL) suppository 650 mg  650 mg Rectal Q6H PRN Elwyn Reach, MD       amLODipine (NORVASC) tablet 5 mg  5 mg Oral Daily Shalhoub, Sherryll Burger, MD   5 mg at 08/28/22 1110   Ampicillin-Sulbactam (UNASYN) 3 g in sodium chloride 0.9 % 100 mL IVPB  3 g Intravenous Q6H Shalhoub, Sherryll Burger, MD   Stopped at 08/28/22 1144   enoxaparin (LOVENOX) injection 40 mg  40 mg Subcutaneous QHS Elwyn Reach, MD   40 mg at 08/27/22 2225  hydrALAZINE (APRESOLINE) injection 10 mg  10 mg Intravenous Q6H PRN Shalhoub, Sherryll Burger, MD       melatonin tablet 10 mg  10 mg Oral QHS PRN Shalhoub, Sherryll Burger, MD       ondansetron [facility]) tablet 4 mg  4 mg Oral Q6H PRN Elwyn Reach, MD       Or   ondansetron (ZOFRAN) injection 4 mg  4 mg Intravenous Q6H PRN Elwyn Reach, MD       polyethylene glycol (MIRALAX / GLYCOLAX) packet 17 g  17 g Oral Daily PRN Shalhoub, Sherryll Burger, MD         Discharge Medications: Please see discharge summary for a list of discharge medications.  Relevant  Imaging Results:  Relevant Lab Results:   Additional Information SS# [id number]  Lennart Pall, LCSW

## 2022-08-28 NOTE — Plan of Care (Signed)

## 2022-08-28 NOTE — Evaluation (Signed)
Physical Therapy Evaluation Patient Details Name: Danielle Dixon MRN: [id number] DOB: November 18, 1940 Today's Date: 08/28/2022  History of Present Illness  Pt admitted from IND living with c/o R knee pain/swelling and found to have UTI. PMH includes: Alzheimer's dementia, RA, frequent UTI, COPD, HTN, and HTN.  Clinical Impression  Pt admitted as above and presenting with functional mobility limitations 2* generalized weakness, balance deficits, and poor endurance.  Pt currently requiring physical assist for all basic mobility and is at high risk for falls.  Pt would benefit from follow up SNF level rehab to maximize IND and safety.      Recommendations for follow up therapy are one component of a multi-disciplinary discharge planning process, led by the attending physician.  Recommendations may be updated based on patient status, additional functional criteria and insurance authorization.  Follow Up Recommendations Skilled nursing-short term rehab (<3 hours/day) Can patient physically be transported by private vehicle: No    Assistance Recommended at Discharge Frequent or constant Supervision/Assistance  Patient can return home with the following  A lot of help with walking and/or transfers;A little help with bathing/dressing/bathroom;Assistance with cooking/housework;Assist for transportation;Help with stairs or ramp for entrance    Equipment Recommendations None recommended by PT  Recommendations for Other Services       Functional Status Assessment Patient has had a recent decline in their functional status and demonstrates the ability to make significant improvements in function in a reasonable and predictable amount of time.     Precautions / Restrictions Precautions Precautions: Fall Restrictions Weight Bearing Restrictions: No      Mobility  Bed Mobility Overal bed mobility: Needs Assistance Bed Mobility: Supine to Sit     Supine to sit: Min guard     General bed  mobility comments: Increased time with HOB elevated and use of bedrail    Transfers Overall transfer level: Needs assistance Equipment used: Rolling walker (2 wheels) Transfers: Sit to/from Stand Sit to Stand: Min assist, Mod assist, From elevated surface           General transfer comment: cues for LE management and use of UEs to self assist;  Physical assist to bring wt up and fwd and to balance in standing with RW    Ambulation/Gait Ambulation/Gait assistance: Min assist, +2 safety/equipment Gait Distance (Feet): 11 Feet Assistive device: Rolling walker (2 wheels) Gait Pattern/deviations: Step-to pattern, Decreased step length - right, Decreased step length - left, Knees buckling, Shuffle, Trunk flexed Gait velocity: decr     General Gait Details: Increased time with cues for posture and position from RW.  Pt very unsteady and fatigues Biochemist, clinical Rankin (Stroke Patients Only)       Balance Overall balance assessment: History of Falls, Needs assistance Sitting-balance support: No upper extremity supported, Feet supported Sitting balance-Leahy Scale: Good     Standing balance support: Bilateral upper extremity supported Standing balance-Leahy Scale: Poor                               Pertinent Vitals/Pain Pain Assessment Pain Assessment: No/denies pain    Home Living Family/patient expects to be discharged to:: Unsure                   Additional Comments: Family interested in ALF but may require SNF level rehab initially    Prior Function  Prior Level of Function : Patient poor historian/Family not available             Mobility Comments: Pt denies using RW but chart indicates she does       Hand Dominance   Dominant Hand: Right    Extremity/Trunk Assessment   Upper Extremity Assessment Upper Extremity Assessment: Generalized weakness    Lower Extremity  Assessment Lower Extremity Assessment: Generalized weakness    Cervical / Trunk Assessment Cervical / Trunk Assessment: Kyphotic  Communication   Communication: HOH  Cognition Arousal/Alertness: Awake/alert Behavior During Therapy: Anxious Overall Cognitive Status: History of cognitive impairments - at baseline                                          General Comments      Exercises     Assessment/Plan    PT Assessment Patient needs continued PT services  PT Problem List Decreased strength;Decreased activity tolerance;Decreased balance;Decreased mobility;Decreased cognition;Decreased knowledge of use of DME       PT Treatment Interventions DME instruction;Gait training;Functional mobility training;Therapeutic exercise;Therapeutic activities;Balance training;Patient/family education    PT Goals (Current goals can be found in the Care Plan section)  Acute Rehab PT Goals Patient Stated Goal: REgain IND PT Goal Formulation: With patient Time For Goal Achievement: 09/11/22 Potential to Achieve Goals: Fair    Frequency Min 3X/week     Co-evaluation               AM-PAC PT "6 Clicks" Mobility  Outcome Measure Help needed turning from your back to your side while in a flat bed without using bedrails?: A Little Help needed moving from lying on your back to sitting on the side of a flat bed without using bedrails?: A Little Help needed moving to and from a bed to a chair (including a wheelchair)?: A Lot Help needed standing up from a chair using your arms (e.g., wheelchair or bedside chair)?: A Lot Help needed to walk in hospital room?: Total Help needed climbing 3-5 steps with a railing? : Total 6 Click Score: 12    End of Session Equipment Utilized During Treatment: Gait belt Activity Tolerance: Patient limited by fatigue Patient left: in chair;with call bell/phone within reach;with chair alarm set;with nursing/sitter in room Nurse Communication:  Mobility status PT Visit Diagnosis: Difficulty in walking, not elsewhere classified (R26.2);Muscle weakness (generalized) (M62.81)    Time: 6301-6010 PT Time Calculation (min) (ACUTE ONLY): 26 min   Charges:   PT Evaluation $PT Eval Low Complexity: 1 Low          [place] Pager [phone] Office [phone]   Gedeon Brandow 08/28/2022, 2:08 PM

## 2022-08-28 NOTE — TOC Initial Note (Signed)
Transition of Care [facility]) - Initial/Assessment Note    Patient Details  Name: Danielle Dixon MRN: [id number] Date of Birth: [birth year]  Transition of Care [facility]) CM/SW Contact:    Lennart Pall, LCSW Phone Number: 08/28/2022, 3:05 PM  Clinical Narrative:                 Met with pt and spoke with sister, [name], to review anticipated dc needs.  Pt is oriented to self only today.  Pt asks that I speak with her sister.   Sister reports that pt is a resident in [place] apt at [facility] and has a private caregiver daily for 4 hrs/ day.  She was recently hospitalized with kidney stones and was discharged to [facility] per recommendations.  Sister reports the SNF stay was "not good because of her (pt) and the facility" and [place]  [place] and then readmitted to [facility]  Sister states, "there is no way she can return to [place].  She needs too much care." Sister is hoping to get pt into an AL apt at [facility] but has not spoken with anyone at [facility] yet.  Explained to sister that we may need to look at SNF again and confirm how many Medicare days have been used/ co pay days that will need to be paid for.  Sister also concerned about plans for stent removal postponed until Jan and asking if this can be done sooner.  Will alert MD and oncoming TOC to concerns.  Expected Discharge Plan: Assisted Living (vs. SNF) Barriers to Discharge: Continued Medical Work up   Patient Goals and CMS Choice Patient states their goals for this hospitalization and ongoing recovery are:: pt with confusion      Expected Discharge Plan and Services Expected Discharge Plan: Assisted Living (vs. SNF) In-house Referral: Clinical Social Work     Living arrangements for the past 2 months: [place]                                      Prior Living Arrangements/Services Living arrangements for the past 2 months:  [place] Lives with:: Self Patient language and need for interpreter reviewed:: Yes Do you feel safe going back to the place where you live?: Yes      Need for Family Participation in Patient Care: Yes (Comment) Care giver support system in place?: No (comment)      Activities of Daily Living Home Assistive Devices/Equipment: None ADL Screening (condition at time of admission) Patient's cognitive ability adequate to safely complete daily activities?: No Is the patient deaf or have difficulty hearing?: No Does the patient have difficulty seeing, even when wearing glasses/contacts?: No Does the patient have difficulty concentrating, remembering, or making decisions?: Yes Patient able to express need for assistance with ADLs?: Yes Does the patient have difficulty dressing or bathing?: Yes Independently performs ADLs?: No Communication: Independent Dressing (OT): Needs assistance Is this a change from baseline?: Pre-admission baseline Grooming: Needs assistance Is this a change from baseline?: Pre-admission baseline Feeding: Needs assistance Is this a change from baseline?: Pre-admission baseline Bathing: Needs assistance Is this a change from baseline?: Pre-admission baseline Toileting: Needs assistance Is this a change from baseline?: Pre-admission baseline In/Out Bed: Needs assistance Is this a change from baseline?: Pre-admission baseline Walks in Home: Needs assistance  Is this a change from baseline?: Pre-admission baseline Does the patient have difficulty walking or climbing stairs?: No Weakness of Legs: None Weakness of Arms/Hands: None  Permission Sought/Granted Permission sought to share information with : Family Supports Permission granted to share information with : Yes, Verbal Permission Granted  Share Information with NAME: [name]     Permission granted to share info w Relationship: sister  Permission granted to share info w Contact  Information: [phone]  Emotional Assessment Appearance:: Appears stated age Attitude/Demeanor/Rapport: Engaged Affect (typically observed): Accepting Orientation: : Oriented to Self, Fluctuating Orientation (Suspected and/or reported Sundowners) Alcohol / Substance Use: Not Applicable Psych Involvement: No (comment)  Admission diagnosis:  Rash [R21] UTI (urinary tract infection) [N39.0] Acute cystitis with hematuria [N30.01] Acute pain of right knee [M25.561] Patient Active Problem List   Diagnosis Date Noted   Osteoarthritis of right knee 08/27/2022   Chronic kidney disease, stage 3a ([place]) 08/27/2022   Ureteral stent present 08/27/2022   Alzheimer's dementia without behavioral disturbance ([place]) 03/54/6568   Complicated UTI (urinary tract infection) 08/26/2022   Obesity (BMI 30-39.9) 07/09/2022   Essential hypertension 07/09/2022   Acute kidney injury superimposed on chronic kidney disease ([place]) 07/07/2022   Renal calculi 09/18/2019   Pressure injury of skin 07/14/2019   Pyohydronephrosis 07/10/2019   Sepsis ([place]) 12/75/1700   Acute metabolic encephalopathy 17/49/4496   Rheumatoid arthritis ([place]) 07/10/2019   Dyspnea 06/25/2013   Thrombocytosis 06/25/2013   Leukocytosis, unspecified 06/25/2013   Need for prophylactic vaccination and inoculation against influenza 06/25/2013   Unspecified vitamin D deficiency 06/25/2013   PCP:  Glenis Smoker, MD Pharmacy:   [id number] [id number]: [phone]: [phone]    Social Determinants of Health (SDOH) Interventions    Readmission Risk Interventions     No data to display

## 2022-08-28 NOTE — Progress Notes (Signed)
PROGRESS NOTE   Danielle Dixon  XLK:[id number] DOB: 1941-03-28 DOA: 08/26/2022 PCP: Glenis Smoker, MD   Date of Service: the patient was seen and examined on 08/28/2022  Brief Narrative:  81 y.o. female with medical history significant of Alzheimer's dementia, depression, essential hypertension, psoriasis, rheumatoid arthritis, COPD, who lives at [place] presented initially with right knee pain and swelling.   Upon evaluation in the emergency department the patient was additionally found to be more confused than her baseline.  Urinalysis was suggestive of urinary tract infection with substantial leukocytosis.  CT imaging the abdomen pelvis revealed fat stranding around the bladder consistent with infection.  Patient was initiated on intravenous antibiotics and the hospitalist group was then called to assess the patient for admission to the hospital.   Assessment and Plan: * Complicated UTI (urinary tract infection) Continued rapid improvement in leukocytosis Currently on intravenous Unasyn due to history of Enterococcus faecalis in the past on 10/25 culture. Awaiting final culture results If patient fails to clinically improve will consider consulting urology for stent removal, patient follows with Dr. Gloriann Loan in the outpatient setting Following blood and urine cultures  Osteoarthritis of right knee Seemingly chronic and improved. likely possibly secondary to osteoarthritis or known history of rheumatoid arthritis.  Chronic kidney disease, stage 3a (HCC) Strict intake and output monitoring Creatinine near baseline Minimizing nephrotoxic agents as much as possible Serial chemistries to monitor renal function and electrolytes   Essential hypertension Relaxed blood pressure targets Continuing home regimen of amlodipine PRN intravenous antihypertensives for excessively elevated blood pressure   Ureteral stent present Placed during hospitalization  06/2022 for obstructing ureteral stone and pyelonephritis Patient is to follow-up as an outpatient with Dr. Gloriann Loan concerning this Patient clinically deteriorates will obtain urology consultation during this hospitalization for potential removal  Rheumatoid arthritis [facility]) Patient does not seem to be on chronic immunomodulating agents for this. Outpatient follow-up  Alzheimer's dementia without behavioral disturbance ([place]) Longstanding known history of dementia and cognitive deficit Minimizing uncomfortable stimuli Minimizing mood altering and sedating agents Frequent redirection by staff Fall precautions      Subjective:  Patient states that she feels much improved compared to yesterday.  She states that her weakness has improved.  Patient denies abdominal pain.  Physical Exam:  Vitals:   08/27/22 2109 08/28/22 0653 08/28/22 1110 08/28/22 1542  BP: (!) 153/83 (!) 160/68 (!) 147/73 (!) 153/70  Pulse: 73 72  73  Resp: '18 18  16  '$ Temp: 97.8 F (36.6 C) (!) 97.5 F (36.4 C)  97.6 F (36.4 C)  TempSrc: Oral   Oral  SpO2: 99% 98%  97%  Weight:      Height:        Constitutional: Continues to be disoriented, only oriented x 1.  No associated distress.   Skin: no rashes, no lesions, poor skin turgor noted. Eyes: Pupils are equally reactive to light.  No evidence of scleral icterus or conjunctival pallor.  ENMT: Dry mucous membranes noted.  Posterior pharynx clear of any exudate or lesions.   Respiratory: clear to auscultation bilaterally, no wheezing, no crackles. Normal respiratory effort. No accessory muscle use.  Cardiovascular: Regular rate and rhythm, no murmurs / rubs / gallops. No extremity edema. 2+ pedal pulses. No carotid bruits.  Abdomen: Notable bowel no tenderness, worst in the suprapubic region.  Abdomen is soft.  No evidence of intra-abdominal masses.  Positive bowel sounds noted in all quadrants.   Musculoskeletal: No joint deformity upper and lower  extremities.  Good ROM, no contractures. Normal muscle tone.    Data Reviewed:  I have personally reviewed and interpreted labs, imaging.  Significant findings are   CBC: Recent Labs  Lab 08/26/22 1412 08/27/22 0008 08/28/22 0343  WBC 21.9* 18.5* 10.3  NEUTROABS 16.4*  --  6.6  HGB 14.5 12.6 11.6*  HCT 49.5* 41.8 38.3  MCV 103.6* 97.0 99.2  PLT 436* 503* 553   Basic Metabolic Panel: Recent Labs  Lab 08/26/22 1652 08/27/22 0008 08/28/22 0738  NA 140 140 144  K 3.9 4.0 3.7  CL 105 104 109  CO2 '25 25 27  '$ GLUCOSE 142* 105* 106*  BUN 14 11 6*  CREATININE 0.99 0.90 0.79  CALCIUM 9.6 9.0 8.7*  MG  --   --  2.0   GFR: Estimated Creatinine Clearance: 61.3 mL/min (by C-G formula based on SCr of 0.79 mg/dL). Liver Function Tests: Recent Labs  Lab 08/26/22 1652 08/27/22 0008 08/28/22 0738  AST 19 14* 13*  ALT '18 14 13  '$ ALKPHOS 116 101 87  BILITOT 0.4 0.3 0.3  PROT 7.6 6.7 6.1*  ALBUMIN 3.3* 3.0* 2.8*      Code Status:  Full code.  Code status decision has been confirmed with: daughter via phone coversation    Severity of Illness:  The appropriate patient status for this patient is INPATIENT. Inpatient status is judged to be reasonable and necessary in order to provide the required intensity of service to ensure the patient's safety. The patient's presenting symptoms, physical exam findings, and initial radiographic and laboratory data in the context of their chronic comorbidities is felt to place them at high risk for further clinical deterioration. Furthermore, it is not anticipated that the patient will be medically stable for discharge from the hospital within 2 midnights of admission.   * I certify that at the point of admission it is my clinical judgment that the patient will require inpatient hospital care spanning beyond 2 midnights from the point of admission due to high intensity of service, high risk for further deterioration and high frequency of surveillance  required.*  Time spent:  35 minutes  Author:  Vernelle Emerald MD  08/28/2022 9:31 PM

## 2022-08-29 DIAGNOSIS — M1711 Unilateral primary osteoarthritis, right knee: Secondary | ICD-10-CM | POA: Diagnosis not present

## 2022-08-29 DIAGNOSIS — I10 Essential (primary) hypertension: Secondary | ICD-10-CM | POA: Diagnosis not present

## 2022-08-29 DIAGNOSIS — N1831 Chronic kidney disease, stage 3a: Secondary | ICD-10-CM | POA: Diagnosis not present

## 2022-08-29 DIAGNOSIS — R531 Weakness: Secondary | ICD-10-CM

## 2022-08-29 DIAGNOSIS — N390 Urinary tract infection, site not specified: Secondary | ICD-10-CM | POA: Diagnosis not present

## 2022-08-29 DIAGNOSIS — E876 Hypokalemia: Secondary | ICD-10-CM | POA: Diagnosis present

## 2022-08-29 LAB — CBC WITH DIFFERENTIAL/PLATELET
Abs Immature Granulocytes: 0.03 10*3/uL (ref 0.00–0.07)
Basophils Absolute: 0.1 10*3/uL (ref 0.0–0.1)
Basophils Relative: 1 %
Eosinophils Absolute: 0.4 10*3/uL (ref 0.0–0.5)
Eosinophils Relative: 5 %
HCT: 41.6 % (ref 36.0–46.0)
Hemoglobin: 12.6 g/dL (ref 12.0–15.0)
Immature Granulocytes: 0 %
Lymphocytes Relative: 28 %
Lymphs Abs: 2.6 10*3/uL (ref 0.7–4.0)
MCH: 29.6 pg (ref 26.0–34.0)
MCHC: 30.3 g/dL (ref 30.0–36.0)
MCV: 97.7 fL (ref 80.0–100.0)
Monocytes Absolute: 0.5 10*3/uL (ref 0.1–1.0)
Monocytes Relative: 5 %
Neutro Abs: 5.5 10*3/uL (ref 1.7–7.7)
Neutrophils Relative %: 61 %
Platelets: 422 10*3/uL — ABNORMAL HIGH (ref 150–400)
RBC: 4.26 MIL/uL (ref 3.87–5.11)
RDW: 14.7 % (ref 11.5–15.5)
WBC: 9.1 10*3/uL (ref 4.0–10.5)
nRBC: 0 % (ref 0.0–0.2)

## 2022-08-29 LAB — COMPREHENSIVE METABOLIC PANEL
ALT: 15 U/L (ref 0–44)
AST: 14 U/L — ABNORMAL LOW (ref 15–41)
Albumin: 3 g/dL — ABNORMAL LOW (ref 3.5–5.0)
Alkaline Phosphatase: 97 U/L (ref 38–126)
Anion gap: 8 (ref 5–15)
BUN: 7 mg/dL — ABNORMAL LOW (ref 8–23)
CO2: 27 mmol/L (ref 22–32)
Calcium: 8.9 mg/dL (ref 8.9–10.3)
Chloride: 108 mmol/L (ref 98–111)
Creatinine, Ser: 0.79 mg/dL (ref 0.44–1.00)
GFR, Estimated: >60 mL/min (ref >60)
Glucose, Bld: 95 mg/dL (ref 70–99)
Potassium: 3.4 mmol/L — ABNORMAL LOW (ref 3.5–5.1)
Sodium: 143 mmol/L (ref 135–145)
Total Bilirubin: 0.4 mg/dL (ref 0.3–1.2)
Total Protein: 6.8 g/dL (ref 6.5–8.1)

## 2022-08-29 LAB — URINE CULTURE: Culture: >=100000 — AB

## 2022-08-29 LAB — MAGNESIUM: Magnesium: 2.3 mg/dL (ref 1.7–2.4)

## 2022-08-29 MED ORDER — SODIUM CHLORIDE 0.9 % IV SOLN
2.0000 g | Freq: Four times a day (QID) | INTRAVENOUS | Status: DC
  Administered 2022-08-29 – 2022-08-31 (×8): 2 g via INTRAVENOUS
  Filled 2022-08-29 (×9): qty 2000

## 2022-08-29 MED ORDER — POTASSIUM CHLORIDE CRYS ER 20 MEQ PO TBCR
40.0000 meq | EXTENDED_RELEASE_TABLET | Freq: Once | ORAL | Status: AC
  Administered 2022-08-29: 40 meq via ORAL
  Filled 2022-08-29: qty 2

## 2022-08-29 NOTE — TOC Progression Note (Signed)
Transition of Care [facility]) - Progression Note   Patient Details  Name: Danielle Dixon MRN: [id number] Date of Birth: 11/05/1940  Transition of Care [facility]) CM/SW [place], LCSW Phone Number: 08/29/2022, 3:42 PM  Clinical Narrative: Patient has bed offers for SNF. CSW went to patient's room, but sister was not present. CSW called sister to discuss bed offers, but sister expressed confusion about why patient is being referred to SNF as she is looking for a memory care bed. Per documentation, TOC staff spoke with sister yesterday about SNF. CSW explained that PT recommended SNF and that unless patient already has a secured memory care bed, TOC would not be able to discharge the patient to the facility as patient is medically ready for discharge to SNF pending bed choice. CSW made multiple attempts to discuss the bed offers and Medicare star ratings, but sister refused to discuss SNF "until I talked to someone." When CSW asked for clarification, sister reported she did not know who she would talk to and stated, "I feel like I am being threatened every turn I take" in regarding to planning for the patient's discharge. CSW attempted to explain to sister that bed offers are only available until the bed is filled and not choosing a bed offer now will affect what is available later. Sister stated, "that's fine" and told CSW to call back tomorrow. CSW updated hospitalist and [facility] supervisor.  Expected Discharge Plan: Assisted Living (vs. SNF) Barriers to Discharge: Continued Medical Work up  Expected Discharge Plan and Services Expected Discharge Plan: Assisted Living (vs. SNF) In-house Referral: Clinical Social Work Living arrangements for the past 2 months: [place]  Social Determinants of Health (SDOH) Interventions    Readmission Risk Interventions     No data to display

## 2022-08-29 NOTE — Plan of Care (Signed)
  Problem: Coping: Goal: Level of anxiety will decrease Outcome: Progressing   Problem: Pain Managment: Goal: General experience of comfort will improve Outcome: Progressing   Problem: Safety: Goal: Ability to remain free from injury will improve Outcome: Progressing   

## 2022-08-29 NOTE — Assessment & Plan Note (Signed)
Physical therapy recommending skilled physical therapy services in a skilled nursing facility [facility] discussing options with family.

## 2022-08-29 NOTE — Assessment & Plan Note (Signed)
·   Replacing with potassium chloride °· Evaluating for concurrent hypomagnesemia  °· Monitoring potassium levels with serial chemistries. ° °

## 2022-08-29 NOTE — Progress Notes (Signed)
PROGRESS NOTE   Danielle Dixon  TIR:[id number] DOB: [birth year] DOA: 08/26/2022 PCP: Glenis Smoker, MD   Date of Service: the patient was seen and examined on 08/29/2022  Brief Narrative:  81 y.o. female with medical history significant of Alzheimer's dementia, depression, essential hypertension, psoriasis, rheumatoid arthritis, COPD, who lives at [place] presented initially with right knee pain and swelling.   Upon evaluation in the emergency department the patient was additionally found to be more confused than her baseline.  Urinalysis was suggestive of urinary tract infection with substantial leukocytosis.  CT imaging the abdomen pelvis revealed fat stranding around the bladder consistent with infection.  Patient was initiated on intravenous antibiotics and the hospitalist group was then called to assess the patient for admission to the hospital.   Assessment and Plan: * Complicated UTI (urinary tract infection) Leukocytosis has resolved Final culture results revealing Enterococcus faecalis that is pansensitive, similar to prior culture on 10/25. Will transition antibiotic therapy to ampicillin Currently on intravenous Unasyn due to history of Enterococcus faecalis in the past on 10/25 culture. Awaiting final culture results Case reviewed with Dr. Gilford Rile covering for urology who did not feel that removal of the patient's left ureteral stent is necessary at this time.  He recommended outpatient follow-up. Following blood and urine cultures  Osteoarthritis of right knee Seemingly chronic and improved. likely possibly secondary to osteoarthritis or known history of rheumatoid arthritis.  Chronic kidney disease, stage 3a (HCC) Strict intake and output monitoring Creatinine near baseline Minimizing nephrotoxic agents as much as possible Serial chemistries to monitor renal function and electrolytes   Essential hypertension Relaxed blood pressure  targets Continuing home regimen of amlodipine PRN intravenous antihypertensives for excessively elevated blood pressure   Ureteral stent present Placed during hospitalization 06/2022 for obstructing ureteral stone and pyelonephritis Case reviewed with Dr. Gilford Rile covering for urology who did not feel that removal of the patient's left ureteral stent is necessary at this time.  He recommended outpatient follow-up. Patient is to maintain follow-up appointment on June 5 with Dr. Gloriann Loan  Rheumatoid arthritis [facility]) Patient does not seem to be on chronic immunomodulating agents for this. Outpatient follow-up  Alzheimer's dementia without behavioral disturbance ([place]) Longstanding known history of dementia and cognitive deficit Minimizing uncomfortable stimuli Minimizing mood altering and sedating agents Frequent redirection by staff Fall precautions   Hypokalemia Replacing with potassium chloride Evaluating for concurrent hypomagnesemia  Monitoring potassium levels with serial chemistries.   Generalized weakness Physical therapy recommending skilled physical therapy services in a skilled nursing facility [facility] discussing options with family.     Subjective:  Patient has no complaints.  She states that her weakness has improved.  Patient denies abdominal pain.  Physical Exam:  Vitals:   08/28/22 2154 08/29/22 0503 08/29/22 1345 08/29/22 2016  BP: (!) 151/109 (!) 153/84 136/60 (!) 143/67  Pulse: 70 69 72 70  Resp: 18 18 (!) 22 18  Temp: (!) 97.5 F (36.4 C) 98 F (36.7 C) 97.8 F (36.6 C) 97.6 F (36.4 C)  TempSrc: Oral  Oral Oral  SpO2: 95% 100% 99% 96%  Weight:      Height:        Constitutional: Pleasantly confused, only oriented x 1.  No associated distress.   Skin: no rashes, no lesions, poor skin turgor noted. Eyes: Pupils are equally reactive to light.  No evidence of scleral icterus or conjunctival pallor.  ENMT: Moist mucous membranes noted.  Posterior pharynx  clear of any exudate or  lesions.   Respiratory: clear to auscultation bilaterally, no wheezing, no crackles. Normal respiratory effort. No accessory muscle use.  Cardiovascular: Regular rate and rhythm, no murmurs / rubs / gallops. No extremity edema. 2+ pedal pulses. No carotid bruits.  Abdomen: Notable bowel no tenderness, worst in the suprapubic region.  Abdomen is soft.  No evidence of intra-abdominal masses.  Positive bowel sounds noted in all quadrants.   Musculoskeletal: No joint deformity upper and lower extremities. Good ROM, no contractures. Normal muscle tone.    Data Reviewed:  I have personally reviewed and interpreted labs, imaging.  Significant findings are   CBC: Recent Labs  Lab 08/26/22 1412 08/27/22 0008 08/28/22 0343 08/29/22 0412  WBC 21.9* 18.5* 10.3 9.1  NEUTROABS 16.4*  --  6.6 5.5  HGB 14.5 12.6 11.6* 12.6  HCT 49.5* 41.8 38.3 41.6  MCV 103.6* 97.0 99.2 97.7  PLT 436* 503* 400 628*   Basic Metabolic Panel: Recent Labs  Lab 08/26/22 1652 08/27/22 0008 08/28/22 0738 08/29/22 0412  NA 140 140 144 143  K 3.9 4.0 3.7 3.4*  CL 105 104 109 108  CO2 '25 25 27 27  '$ GLUCOSE 142* 105* 106* 95  BUN 14 11 6* 7*  CREATININE 0.99 0.90 0.79 0.79  CALCIUM 9.6 9.0 8.7* 8.9  MG  --   --  2.0 2.3   GFR: Estimated Creatinine Clearance: 61.3 mL/min (by C-G formula based on SCr of 0.79 mg/dL). Liver Function Tests: Recent Labs  Lab 08/26/22 1652 08/27/22 0008 08/28/22 0738 08/29/22 0412  AST 19 14* 13* 14*  ALT '18 14 13 15  '$ ALKPHOS 116 101 87 97  BILITOT 0.4 0.3 0.3 0.4  PROT 7.6 6.7 6.1* 6.8  ALBUMIN 3.3* 3.0* 2.8* 3.0*      Code Status:  Full code.  Code status decision has been confirmed with: daughter via phone coversation    Severity of Illness:  The appropriate patient status for this patient is INPATIENT. Inpatient status is judged to be reasonable and necessary in order to provide the required intensity of service to ensure the patient's  safety. The patient's presenting symptoms, physical exam findings, and initial radiographic and laboratory data in the context of their chronic comorbidities is felt to place them at high risk for further clinical deterioration. Furthermore, it is not anticipated that the patient will be medically stable for discharge from the hospital within 2 midnights of admission.   * I certify that at the point of admission it is my clinical judgment that the patient will require inpatient hospital care spanning beyond 2 midnights from the point of admission due to high intensity of service, high risk for further deterioration and high frequency of surveillance required.*  Time spent:  50 minutes  Author:  Vernelle Emerald MD  08/29/2022 9:29 PM

## 2022-08-30 DIAGNOSIS — R531 Weakness: Secondary | ICD-10-CM

## 2022-08-30 DIAGNOSIS — E876 Hypokalemia: Secondary | ICD-10-CM | POA: Diagnosis not present

## 2022-08-30 DIAGNOSIS — M069 Rheumatoid arthritis, unspecified: Secondary | ICD-10-CM | POA: Diagnosis not present

## 2022-08-30 DIAGNOSIS — N390 Urinary tract infection, site not specified: Secondary | ICD-10-CM | POA: Diagnosis not present

## 2022-08-30 DIAGNOSIS — M1711 Unilateral primary osteoarthritis, right knee: Secondary | ICD-10-CM | POA: Diagnosis not present

## 2022-08-30 LAB — COMPREHENSIVE METABOLIC PANEL
ALT: 13 U/L (ref 0–44)
AST: 14 U/L — ABNORMAL LOW (ref 15–41)
Albumin: 2.7 g/dL — ABNORMAL LOW (ref 3.5–5.0)
Alkaline Phosphatase: 83 U/L (ref 38–126)
Anion gap: 7 (ref 5–15)
BUN: 9 mg/dL (ref 8–23)
CO2: 26 mmol/L (ref 22–32)
Calcium: 9.1 mg/dL (ref 8.9–10.3)
Chloride: 107 mmol/L (ref 98–111)
Creatinine, Ser: 0.63 mg/dL (ref 0.44–1.00)
GFR, Estimated: >60 mL/min (ref >60)
Glucose, Bld: 106 mg/dL — ABNORMAL HIGH (ref 70–99)
Potassium: 4.1 mmol/L (ref 3.5–5.1)
Sodium: 140 mmol/L (ref 135–145)
Total Bilirubin: 0.3 mg/dL (ref 0.3–1.2)
Total Protein: 6.3 g/dL — ABNORMAL LOW (ref 6.5–8.1)

## 2022-08-30 LAB — CBC WITH DIFFERENTIAL/PLATELET
Abs Immature Granulocytes: 0.03 10*3/uL (ref 0.00–0.07)
Basophils Absolute: 0.1 10*3/uL (ref 0.0–0.1)
Basophils Relative: 1 %
Eosinophils Absolute: 0.5 10*3/uL (ref 0.0–0.5)
Eosinophils Relative: 5 %
HCT: 41.5 % (ref 36.0–46.0)
Hemoglobin: 12.7 g/dL (ref 12.0–15.0)
Immature Granulocytes: 0 %
Lymphocytes Relative: 23 %
Lymphs Abs: 2.5 10*3/uL (ref 0.7–4.0)
MCH: 30.1 pg (ref 26.0–34.0)
MCHC: 30.6 g/dL (ref 30.0–36.0)
MCV: 98.3 fL (ref 80.0–100.0)
Monocytes Absolute: 0.6 10*3/uL (ref 0.1–1.0)
Monocytes Relative: 5 %
Neutro Abs: 7.3 10*3/uL (ref 1.7–7.7)
Neutrophils Relative %: 66 %
Platelets: 375 10*3/uL (ref 150–400)
RBC: 4.22 MIL/uL (ref 3.87–5.11)
RDW: 14.8 % (ref 11.5–15.5)
WBC: 11 10*3/uL — ABNORMAL HIGH (ref 4.0–10.5)
nRBC: 0 % (ref 0.0–0.2)

## 2022-08-30 LAB — MAGNESIUM: Magnesium: 2.1 mg/dL (ref 1.7–2.4)

## 2022-08-30 NOTE — TOC Progression Note (Signed)
Transition of Care [facility]) - Progression Note    Patient Details  Name: Ketzia Guzek MRN: [id number] Date of Birth: Jan 31, 1941  Transition [facility] Care [facility]) CM/SW Contact  Joaquin Courts, RN Phone Number: 08/30/2022, 3:51 PM  Clinical Narrative:     CM spoke with patient's sister and went over need to select snf for rehab as well as delays due to medicare vs bcbs as primary.  Sister reports she has tried to call medicare but  is struggling with reaching anyone and has been disconnected twice.  Have explained the need to select a facility, and sister asked if CM could choose for her, have explained that CM cannot make the choice nor influence her decision, but can provide medicare ratings and locations to assist her in decision making, sister reports she has the ratings.  Provided sister with addresses for all bed offers.  Sister Dyanne Iha Place, have left a VM for admission/ central intake notifying of selection.  Plan now to meet with sister at bedside tomorrow 08/31/22 at 9 am to help her dial medicare and navigate to a representative to address the barrier with insurance primary payor.  Expected Discharge Plan: Assisted Living (vs. SNF) Barriers to Discharge: Continued Medical Work up  Expected Discharge Plan and Services Expected Discharge Plan: Assisted Living (vs. SNF) In-house Referral: Clinical Social Work     Living arrangements for the past 2 months: [place]                                       Social Determinants of Health (SDOH) Interventions    Readmission Risk Interventions     No data to display

## 2022-08-30 NOTE — TOC Progression Note (Addendum)
Transition of Care [facility]) - Progression Note   Patient Details  Name: Danielle Dixon MRN: [id number] Date of Birth: [birth year]  Transition of Care [facility]) CM/SW [place], LCSW Phone Number: 08/30/2022, 2:21 PM  Clinical Narrative: CSW notified that patient's BCBS commercial plan is listed as the primary insurance in the [employer], so [employer] ([place]), [facility] [place]), [facility] (Narda R[facility]), [facility]), and [facility]) rescinded the bed offers as these were made for the Medicare A&B as primary. CSW spoke with sister and explained she will need to contact the patient's Medicare and BCBS to determine which insurance is supposed to be primary.  CSW received call from [place] with [employer] [phone]) regarding patient's insurance. Per Ubaldo Glassing, the patient's Medicare is supposed to be primary and she has informed the sister of that. CSW followed up with sister on the phone and sister reported she was coming to the hospital to try to get the patient's Medicare switched to her primary insurance, but will need the patient on the phone to do that. Sister brought up memory care again and CSW reiterated that [facility] assists with SNF placement and not ALFs/memory care as these are private pay. Sister stated, "I'll have [name] call you" from [employer]. CSW informed sister that [name] can contact TOC, but TOC's role would be to place the patient for SNF.  CSW met with patient and sister in the room. Sister reported she was on hold with Medicare when the call dropped and stated, "I'm done for the day." CSW explained that [place] is not going to cover SNF, so the Medicare issue will need to be addressed today as the patient is medically ready for SNF. Sister stated, "what are you going to do if I don't do it?" CSW explained that the hospitalist and [facility] supervisor would be notified as sister continues to refuse to choose a SNF bed and is not assisting with fixing the  insurance issue, which is delaying the patient's discharge.  Hospitalist and [facility] supervisor updated. CSW awaiting bed choice.  Addendum: Sister provided [employer] as bed choice. CSW notified [name] with [facility] and is awaiting bed confirmation.  Expected Discharge Plan: Assisted Living (vs. SNF) Barriers to Discharge: Continued Medical Work up  Expected Discharge Plan and Services Expected Discharge Plan: Assisted Living (vs. SNF) In-house Referral: Clinical Social Work Living arrangements for the past 2 months: [place]  Social Determinants of Health (SDOH) Interventions    Readmission Risk Interventions     No data to display

## 2022-08-30 NOTE — Progress Notes (Addendum)
PROGRESS NOTE   Danielle Dixon  MVH:[id number] DOB: 04/05/1941 DOA: 08/26/2022 PCP: Glenis Smoker, MD   Date of Service: the patient was seen and examined on 08/30/2022  Brief Narrative:  81 y.o. female with medical history significant of Alzheimer's dementia, depression, essential hypertension, psoriasis, rheumatoid arthritis, COPD, who lives at [place] presented initially with right knee pain and swelling.   Upon evaluation in the emergency department the patient was additionally found to be more confused than her baseline.  Urinalysis was suggestive of urinary tract infection with substantial leukocytosis.  CT imaging the abdomen pelvis revealed fat stranding around the bladder consistent with infection.  Patient was initiated on intravenous antibiotics and the hospitalist group was then called to assess the patient for admission to the hospital.  Patient was treated with intravenous antibiotic therapy initially with ceftriaxone but quickly transition to intravenous Unasyn once it was determined the patient has a history of Enterococcus faecalis urinary tract infection.  Urine culture on this hospitalization ended up growing out Enterococcus faecalis once again.  Presence of left ureteral stent precipitated the need to treat the patient with a total of 7 days of antibiotics considering complicated nature of urinary tract infection.  Patient clinically improved with resolution of leukocytosis and weakness.  PT/OT recommending SNF placement.   Assessment and Plan: * Complicated UTI (urinary tract infection) Medically improved on Unasyn which patient has received day 3 of 7 Treating for a total of 7 days considering complex nature of infection with indwelling stent Final culture results revealing Enterococcus faecalis that is pansensitive, similar to prior culture on 10/25. Will continue on intravenous Unasyn and transition to oral ampicillin if a SNF bed  has been obtained prior to completion of antibiotic course. Will transition antibiotic therapy to ampicillin Currently on intravenous Unasyn due to history of Enterococcus faecalis in the past on 10/25 culture.   Osteoarthritis of right knee Chronic right knee pain that waxes and wanes.  Currently patient is pain-free. likely possibly secondary to osteoarthritis or known history of rheumatoid arthritis.  Chronic kidney disease, stage 3a (HCC) Strict intake and output monitoring Creatinine near baseline Minimizing nephrotoxic agents as much as possible Serial chemistries to monitor renal function and electrolytes   Essential hypertension Relaxed blood pressure targets Continuing home regimen of amlodipine PRN intravenous antihypertensives for excessively elevated blood pressure   Ureteral stent present Placed during hospitalization 06/2022 for obstructing ureteral stone and pyelonephritis Case reviewed with Dr. Gilford Rile covering for urology on 12/18.  He did not feel that removal of the patient's left ureteral stent is necessary at this time and recommended patient maintain their January 5 appointment with Dr. Gloriann Loan for stent removal.  Rheumatoid arthritis [facility]) Patient does not seem to be on chronic immunomodulating agents for this. Outpatient follow-up  Alzheimer's dementia without behavioral disturbance ([place]) Longstanding known history of dementia and cognitive deficit Minimizing uncomfortable stimuli Minimizing mood altering and sedating agents Frequent redirection by staff Fall precautions   Hypokalemia Replaced   Generalized weakness Physical therapy recommending skilled physical therapy services in a skilled nursing facility Sister who makes medical decisions for the patient is apprehensive about agreeing to SNF placement.  I have personally attempted to contact the sister via the listed phone number for the past several days and have been unsuccessful and leaving  voicemails each time.     Subjective:  Patient has no complaints.  Patient denies abdominal pain.  Physical Exam:  Vitals:   08/29/22 2016 08/30/22 0149 08/30/22  0558 08/30/22 1345  BP:  (!) 157/70 (!) 144/74 (!) 140/62  Pulse: 70 73  68  Resp:  '16 18 17  '$ Temp:  98.1 F (36.7 C) 98.3 F (36.8 C) 98.1 F (36.7 C)  TempSrc:  Oral Oral Oral  SpO2:  100% 98% 97%  Weight:      Height:        Constitutional: Pleasantly confused, only oriented x 1.  No associated distress.   Skin: no rashes, no lesions, poor skin turgor noted. Eyes: Pupils are equally reactive to light.  No evidence of scleral icterus or conjunctival pallor.  ENMT: Moist mucous membranes noted.  Posterior pharynx clear of any exudate or lesions.   Respiratory: clear to auscultation bilaterally, no wheezing, no crackles. Normal respiratory effort. No accessory muscle use.  Cardiovascular: Regular rate and rhythm, no murmurs / rubs / gallops. No extremity edema. 2+ pedal pulses. No carotid bruits.  Abdomen: Notable bowel no tenderness, worst in the suprapubic region.  Abdomen is soft.  No evidence of intra-abdominal masses.  Positive bowel sounds noted in all quadrants.   Musculoskeletal: Right knee pain has resolved.  Good ROM, no contractures. Normal muscle tone.    Data Reviewed:  I have personally reviewed and interpreted labs, imaging.  Significant findings are   CBC: Recent Labs  Lab 08/26/22 1412 08/27/22 0008 08/28/22 0343 08/29/22 0412 08/30/22 0409  WBC 21.9* 18.5* 10.3 9.1 11.0*  NEUTROABS 16.4*  --  6.6 5.5 7.3  HGB 14.5 12.6 11.6* 12.6 12.7  HCT 49.5* 41.8 38.3 41.6 41.5  MCV 103.6* 97.0 99.2 97.7 98.3  PLT 436* 503* 400 422* 829   Basic Metabolic Panel: Recent Labs  Lab 08/26/22 1652 08/27/22 0008 08/28/22 0738 08/29/22 0412 08/30/22 0409  NA 140 140 144 143 140  K 3.9 4.0 3.7 3.4* 4.1  CL 105 104 109 108 107  CO2 '25 25 27 27 26  '$ GLUCOSE 142* 105* 106* 95 106*  BUN 14 11 6*  7* 9  CREATININE 0.99 0.90 0.79 0.79 0.63  CALCIUM 9.6 9.0 8.7* 8.9 9.1  MG  --   --  2.0 2.3 2.1   GFR: Estimated Creatinine Clearance: 61.3 mL/min (by C-G formula based on SCr of 0.63 mg/dL). Liver Function Tests: Recent Labs  Lab 08/26/22 1652 08/27/22 0008 08/28/22 0738 08/29/22 0412 08/30/22 0409  AST 19 14* 13* 14* 14*  ALT '18 14 13 15 13  '$ ALKPHOS 116 101 87 97 83  BILITOT 0.4 0.3 0.3 0.4 0.3  PROT 7.6 6.7 6.1* 6.8 6.3*  ALBUMIN 3.3* 3.0* 2.8* 3.0* 2.7*      Code Status:  Full code.  Code status decision has been confirmed with: daughter via phone coversation    Severity of Illness:  The appropriate patient status for this patient is INPATIENT. Inpatient status is judged to be reasonable and necessary in order to provide the required intensity of service to ensure the patient's safety. The patient's presenting symptoms, physical exam findings, and initial radiographic and laboratory data in the context of their chronic comorbidities is felt to place them at high risk for further clinical deterioration. Furthermore, it is not anticipated that the patient will be medically stable for discharge from the hospital within 2 midnights of admission.   * I certify that at the point of admission it is my clinical judgment that the patient will require inpatient hospital care spanning beyond 2 midnights from the point of admission due to high intensity of service, high  risk for further deterioration and high frequency of surveillance required.*  Time spent: 35 minutes  Author:  Vernelle Emerald MD  08/30/2022 5:44 PM

## 2022-08-31 DIAGNOSIS — Z7401 Bed confinement status: Secondary | ICD-10-CM | POA: Diagnosis not present

## 2022-08-31 DIAGNOSIS — L409 Psoriasis, unspecified: Secondary | ICD-10-CM | POA: Diagnosis not present

## 2022-08-31 DIAGNOSIS — D72829 Elevated white blood cell count, unspecified: Secondary | ICD-10-CM | POA: Diagnosis not present

## 2022-08-31 DIAGNOSIS — L400 Psoriasis vulgaris: Secondary | ICD-10-CM | POA: Diagnosis not present

## 2022-08-31 DIAGNOSIS — R279 Unspecified lack of coordination: Secondary | ICD-10-CM | POA: Diagnosis not present

## 2022-08-31 DIAGNOSIS — N132 Hydronephrosis with renal and ureteral calculous obstruction: Secondary | ICD-10-CM | POA: Diagnosis not present

## 2022-08-31 DIAGNOSIS — Z0000 Encounter for general adult medical examination without abnormal findings: Secondary | ICD-10-CM | POA: Diagnosis not present

## 2022-08-31 DIAGNOSIS — R3981 Functional urinary incontinence: Secondary | ICD-10-CM | POA: Diagnosis not present

## 2022-08-31 DIAGNOSIS — M1710 Unilateral primary osteoarthritis, unspecified knee: Secondary | ICD-10-CM | POA: Diagnosis not present

## 2022-08-31 DIAGNOSIS — R488 Other symbolic dysfunctions: Secondary | ICD-10-CM | POA: Diagnosis not present

## 2022-08-31 DIAGNOSIS — N1330 Unspecified hydronephrosis: Secondary | ICD-10-CM | POA: Diagnosis not present

## 2022-08-31 DIAGNOSIS — N202 Calculus of kidney with calculus of ureter: Secondary | ICD-10-CM | POA: Diagnosis not present

## 2022-08-31 DIAGNOSIS — F418 Other specified anxiety disorders: Secondary | ICD-10-CM | POA: Diagnosis not present

## 2022-08-31 DIAGNOSIS — L413 Small plaque parapsoriasis: Secondary | ICD-10-CM | POA: Diagnosis not present

## 2022-08-31 DIAGNOSIS — J449 Chronic obstructive pulmonary disease, unspecified: Secondary | ICD-10-CM | POA: Diagnosis not present

## 2022-08-31 DIAGNOSIS — M1711 Unilateral primary osteoarthritis, right knee: Secondary | ICD-10-CM | POA: Diagnosis not present

## 2022-08-31 DIAGNOSIS — N390 Urinary tract infection, site not specified: Secondary | ICD-10-CM | POA: Diagnosis not present

## 2022-08-31 DIAGNOSIS — R531 Weakness: Secondary | ICD-10-CM | POA: Diagnosis not present

## 2022-08-31 DIAGNOSIS — M6281 Muscle weakness (generalized): Secondary | ICD-10-CM | POA: Diagnosis not present

## 2022-08-31 DIAGNOSIS — Z960 Presence of urogenital implants: Secondary | ICD-10-CM | POA: Diagnosis not present

## 2022-08-31 DIAGNOSIS — L4052 Psoriatic arthritis mutilans: Secondary | ICD-10-CM | POA: Diagnosis not present

## 2022-08-31 DIAGNOSIS — F0390 Unspecified dementia without behavioral disturbance: Secondary | ICD-10-CM | POA: Diagnosis not present

## 2022-08-31 DIAGNOSIS — M05841 Other rheumatoid arthritis with rheumatoid factor of right hand: Secondary | ICD-10-CM | POA: Diagnosis not present

## 2022-08-31 DIAGNOSIS — E876 Hypokalemia: Secondary | ICD-10-CM | POA: Diagnosis not present

## 2022-08-31 DIAGNOSIS — A419 Sepsis, unspecified organism: Secondary | ICD-10-CM | POA: Diagnosis not present

## 2022-08-31 DIAGNOSIS — G309 Alzheimer's disease, unspecified: Secondary | ICD-10-CM | POA: Diagnosis not present

## 2022-08-31 DIAGNOSIS — I10 Essential (primary) hypertension: Secondary | ICD-10-CM | POA: Diagnosis not present

## 2022-08-31 DIAGNOSIS — Z79899 Other long term (current) drug therapy: Secondary | ICD-10-CM | POA: Diagnosis not present

## 2022-08-31 DIAGNOSIS — N201 Calculus of ureter: Secondary | ICD-10-CM | POA: Diagnosis not present

## 2022-08-31 LAB — CULTURE, BLOOD (ROUTINE X 2)
Culture: NO GROWTH
Special Requests: ADEQUATE

## 2022-08-31 LAB — COMPREHENSIVE METABOLIC PANEL
ALT: 14 U/L (ref 0–44)
AST: 14 U/L — ABNORMAL LOW (ref 15–41)
Albumin: 2.9 g/dL — ABNORMAL LOW (ref 3.5–5.0)
Alkaline Phosphatase: 88 U/L (ref 38–126)
Anion gap: 9 (ref 5–15)
BUN: 8 mg/dL (ref 8–23)
CO2: 26 mmol/L (ref 22–32)
Calcium: 9.1 mg/dL (ref 8.9–10.3)
Chloride: 106 mmol/L (ref 98–111)
Creatinine, Ser: 0.95 mg/dL (ref 0.44–1.00)
GFR, Estimated: 60 mL/min — ABNORMAL LOW (ref >60)
Glucose, Bld: 104 mg/dL — ABNORMAL HIGH (ref 70–99)
Potassium: 3.9 mmol/L (ref 3.5–5.1)
Sodium: 141 mmol/L (ref 135–145)
Total Bilirubin: 0.4 mg/dL (ref 0.3–1.2)
Total Protein: 6.6 g/dL (ref 6.5–8.1)

## 2022-08-31 LAB — CBC WITH DIFFERENTIAL/PLATELET
Abs Immature Granulocytes: 0.04 10*3/uL (ref 0.00–0.07)
Basophils Absolute: 0.1 10*3/uL (ref 0.0–0.1)
Basophils Relative: 1 %
Eosinophils Absolute: 0.6 10*3/uL — ABNORMAL HIGH (ref 0.0–0.5)
Eosinophils Relative: 5 %
HCT: 43.8 % (ref 36.0–46.0)
Hemoglobin: 12.9 g/dL (ref 12.0–15.0)
Immature Granulocytes: 0 %
Lymphocytes Relative: 28 %
Lymphs Abs: 3.7 10*3/uL (ref 0.7–4.0)
MCH: 29.4 pg (ref 26.0–34.0)
MCHC: 29.5 g/dL — ABNORMAL LOW (ref 30.0–36.0)
MCV: 99.8 fL (ref 80.0–100.0)
Monocytes Absolute: 0.7 10*3/uL (ref 0.1–1.0)
Monocytes Relative: 6 %
Neutro Abs: 8.1 10*3/uL — ABNORMAL HIGH (ref 1.7–7.7)
Neutrophils Relative %: 60 %
Platelets: 398 10*3/uL (ref 150–400)
RBC: 4.39 MIL/uL (ref 3.87–5.11)
RDW: 14.7 % (ref 11.5–15.5)
WBC: 13.2 10*3/uL — ABNORMAL HIGH (ref 4.0–10.5)
nRBC: 0 % (ref 0.0–0.2)

## 2022-08-31 LAB — MAGNESIUM: Magnesium: 2.2 mg/dL (ref 1.7–2.4)

## 2022-08-31 MED ORDER — TRIAMCINOLONE ACETONIDE 0.5 % EX CREA
TOPICAL_CREAM | Freq: Two times a day (BID) | CUTANEOUS | Status: DC
  Filled 2022-08-31: qty 15

## 2022-08-31 MED ORDER — AMPICILLIN 500 MG PO CAPS: 500.0000 mg | ORAL_CAPSULE | Freq: Four times a day (QID) | ORAL | 0 refills | Status: AC

## 2022-08-31 NOTE — Progress Notes (Signed)
Dr Verlon Au notified of patients skin assessment.  He came to the bedside to see patient.

## 2022-08-31 NOTE — Progress Notes (Signed)
Report called to [facility] and given to nurse Precious Lovena Le.

## 2022-08-31 NOTE — Progress Notes (Signed)
Assessment unchanged. PTAR here to take pt to [facility]. IV dc'd. Dressed pt. No complaints voiced. Packet given. To PTAR attendant. Discharged via stretcher to ambulance to transport to facility.

## 2022-08-31 NOTE — TOC Progression Note (Signed)
Transition of Care [facility]) - Progression Note    Patient Details  Name: Danielle Dixon MRN: [id number] Date of Birth: 04-Apr-1941  Transition of Care [facility]) CM/SW Contact  Joaquin Courts, RN Phone Number: 08/31/2022, 9:55 AM  Clinical Narrative:     CM met with patient's sister and patient at bedside and assisted with dialing medicare and navigating to a live-representative.  Patient's sister spoke with representative regarding need to update medicare back to primary payor status.  Per representative, Roberta ID# [id number], this change has been made but may take up to ten days to be visible to providers.  Angelita Ingles reports that medicare can now be billed for services.  CM outreached to [facility] and updated central intake of this change, whitney with [facility] reports she will re-run benefits and notify CM if patient can transition to facility today.  CM did explain to whitney that changes may take up to 10 days to be visible on her end.  Awaiting call back from [facility].     Barriers to Discharge: Continued Medical Work up  Expected Discharge Plan and Services In-house Referral: Clinical Social Work     Living arrangements for the past 2 months: [place]                                       Social Determinants of Health (SDOH) Interventions SDOH Screenings   Tobacco Use: Low Risk  (08/26/2022)    Readmission Risk Interventions     No data to display

## 2022-08-31 NOTE — TOC Transition Note (Signed)
Transition of Care [facility]) - CM/SW Discharge Note  Patient Details  Name: Danielle Dixon MRN: [id number] Date of Birth: [birth year]  Transition of Care [facility]) CM/SW Contact:  Sherie Don, LCSW Phone Number: 08/31/2022, 1:03 PM  Clinical Narrative: [facility] can accept the patient today for rehab now that the [facility] insurance issue has been fixed. The number for report is [phone]. Discharge summary, discharge orders, and SNF transfer report faxed to facility in hub. Medical necessity form done; PTAR scheduled. Discharge packet completed. RN updated. TOC signing off.    Final next level of care: Skilled Nursing Facility Barriers to Discharge: Barriers Resolved  Patient Goals and CMS Choice Patient states their goals for this hospitalization and ongoing recovery are:: pt with confusion CMS Medicare.gov Compare Post Acute Care list provided to:: Patient Represenative (must comment) Choice offered to / list presented to : [facility] POA / Guardian, Sibling  Discharge Placement Existing PASRR number confirmed : 08/28/22          Patient chooses bed at: Other - please specify in the comment section below: ([place]) Patient to be transferred to facility by: [place] Name of family member notified: [name] (sister) Patient and family notified of of transfer: 08/31/22  Discharge Plan and Services In-house Referral: Clinical Social Work     DME Arranged: N/A DME Agency: NA  Social Determinants of Health ([place]) Interventions    Readmission Risk Interventions     No data to display

## 2022-08-31 NOTE — Progress Notes (Signed)
Physical Therapy Treatment Patient Details Name: Danielle Dixon MRN: [id number] DOB: 01/26/1941 Today's Date: 08/31/2022   History of Present Illness Pt admitted from IND living with c/o R knee pain/swelling and found to have UTI. PMH includes: Alzheimer's dementia, RA, frequent UTI, COPD, HTN, and HTN.    PT Comments    Pt is progressing toward goals, cooperative; activity tolerance improving however does continue to fatigue easily requiring seated rest to recover. Continue to recommend SNF    Recommendations for follow up therapy are one component of a multi-disciplinary discharge planning process, led by the attending physician.  Recommendations may be updated based on patient status, additional functional criteria and insurance authorization.  Follow Up Recommendations  Skilled nursing-short term rehab (<3 hours/day) Can patient physically be transported by private vehicle: No   Assistance Recommended at Discharge Frequent or constant Supervision/Assistance  Patient can return home with the following A lot of help with walking and/or transfers;A little help with bathing/dressing/bathroom;Assistance with cooking/housework;Assist for transportation;Help with stairs or ramp for entrance   Equipment Recommendations  None recommended by PT    Recommendations for Other Services       Precautions / Restrictions Precautions Precautions: Fall Restrictions Weight Bearing Restrictions: No     Mobility  Bed Mobility               General bed mobility comments: on BSC    Transfers Overall transfer level: Needs assistance Equipment used: Rolling walker (2 wheels) Transfers: Sit to/from Stand Sit to Stand: Min assist           General transfer comment: cues for LE management and use of UEs to self assist;  Physical assist to bring wt up and fwd and to balance in standing with RW    Ambulation/Gait Ambulation/Gait assistance: Min assist, +2 safety/equipment Gait  Distance (Feet): 16 Feet Assistive device: Rolling walker (2 wheels) Gait Pattern/deviations: Step-through pattern, Decreased stride length, Trunk flexed       General Gait Details: Increased time with cues for posture and position from RW.  Pt very unsteady and fatigues Information systems manager [place] (Stroke Patients Only)       Balance Overall balance assessment: History of Falls, Needs assistance Sitting-balance support: No upper extremity supported, Feet supported Sitting balance-Leahy Scale: Good     Standing balance support: Bilateral upper extremity supported Standing balance-Leahy Scale: Poor Standing balance comment: reliant on UEs and external assist                            Cognition Arousal/Alertness: Awake/alert Behavior During Therapy: WFL for tasks assessed/performed Overall Cognitive Status: History of cognitive impairments - at baseline                                 General Comments: pt is pleasant and cooperative, following bsic functional commands with incr time        Exercises      General Comments        Pertinent Vitals/Pain Pain Assessment Pain Assessment: No/denies pain (reports R knee pain improved, still hurts at times)    Home Living                          Prior Function  PT Goals (current goals can now be found in the care plan section) Acute Rehab PT Goals Patient Stated Goal: REgain IND PT Goal Formulation: With patient Time For Goal Achievement: 09/11/22 Potential to Achieve Goals: Fair Progress towards PT goals: Progressing toward goals    Frequency    Min 3X/week      PT Plan Current plan remains appropriate    Co-evaluation              AM-PAC PT "6 Clicks" Mobility   Outcome Measure  Help needed turning from your back to your side while in a flat bed without using bedrails?: A Little Help needed moving  from lying on your back to sitting on the side of a flat bed without using bedrails?: A Little Help needed moving to and from a bed to a chair (including a wheelchair)?: A Little Help needed standing up from a chair using your arms (e.g., wheelchair or bedside chair)?: A Little Help needed to walk in hospital room?: A Lot Help needed climbing 3-5 steps with a railing? : Total 6 Click Score: 15    End of Session Equipment Utilized During Treatment: Gait belt Activity Tolerance: Patient limited by fatigue;Patient tolerated treatment well Patient left: in chair;with call bell/phone within reach;with chair alarm set;with family/visitor present Nurse Communication: Mobility status PT Visit Diagnosis: Difficulty in walking, not elsewhere classified (R26.2);Muscle weakness (generalized) (M62.81)     Time: 4166-0630 PT Time Calculation (min) (ACUTE ONLY): 13 min  Charges:  $Gait Training: 8-22 mins                     Baxter Flattery, PT  Acute Rehab Dept [facility]) [phone]  [facility] Weekend Pager [facility] only)  [phone]  08/31/2022    [facility] 08/31/2022, 12:33 PM

## 2022-08-31 NOTE — Plan of Care (Signed)
  Problem: Education: Goal: Knowledge of General Education information will improve Description: Including pain rating scale, medication(s)/side effects and non-pharmacologic comfort measures Outcome: Adequate for Discharge   Problem: Health Behavior/Discharge Planning: Goal: Ability to manage health-related needs will improve Outcome: Adequate for Discharge   Problem: Clinical Measurements: Goal: Ability to maintain clinical measurements within normal limits will improve Outcome: Adequate for Discharge Goal: Will remain free from infection Outcome: Adequate for Discharge Goal: Diagnostic test results will improve Outcome: Adequate for Discharge Goal: Respiratory complications will improve Outcome: Adequate for Discharge Goal: Cardiovascular complication will be avoided Outcome: Adequate for Discharge   Problem: Activity: Goal: Risk for activity intolerance will decrease Outcome: Adequate for Discharge   Problem: Nutrition: Goal: Adequate nutrition will be maintained Outcome: Adequate for Discharge   Problem: Coping: Goal: Level of anxiety will decrease Outcome: Adequate for Discharge   Problem: Elimination: Goal: Will not experience complications related to bowel motility Outcome: Adequate for Discharge Goal: Will not experience complications related to urinary retention Outcome: Adequate for Discharge   Problem: Pain Managment: Goal: General experience of comfort will improve Outcome: Adequate for Discharge   Problem: Safety: Goal: Ability to remain free from injury will improve Outcome: Adequate for Discharge   Problem: Skin Integrity: Goal: Risk for impaired skin integrity will decrease Outcome: Adequate for Discharge   Problem: Acute Rehab PT Goals(only PT should resolve) Goal: Pt Will Go Supine/Side To Sit Outcome: Adequate for Discharge Goal: Pt Will Go Sit To Supine/Side Outcome: Adequate for Discharge Goal: Patient Will Transfer Sit To/From  Stand Outcome: Adequate for Discharge Goal: Pt Will Ambulate Outcome: Adequate for Discharge

## 2022-08-31 NOTE — Discharge Summary (Signed)
Physician Discharge Summary  Danielle Dixon XHB:[id number] DOB: 26-Mar-1941 DOA: 08/26/2022  PCP: Glenis Smoker, MD  Admit date: 08/26/2022 Discharge date: 08/31/2022  Time spent: 37 minutes  Recommendations for Outpatient Follow-up:  Needs coordination with Dr. 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-she should resume her triamcinolone-it appears she was on methotrexate previously and that should be held given her recurrent urinary tract infections for now Obtain CBC and Chem-7 in about 1 week  Discharge Diagnoses:  MAIN problem for hospitalization   Sepsis secondary to complicated urinary infection in the presence of ureteric stent and pyelo AKI on admission hypokalemia this admission Psoriatic arthritis and rheumatoid arthritis  Please see below for itemized issues addressed in [facility]psital- refer to other progress notes for clarity if needed  Discharge Condition: Improved  Diet recommendation: Heart healthy  Filed Weights   08/27/22 0429  Weight: 90 kg    History of present illness:  81 year old white female known depression HTN psoriasis with plaque psoriasis followed by [facility] dermatology Rheumatoid arthritis COPD Presented with metabolic encephalopathy [id number]/01-[id number] revealed a UTI CT scan confirmed infection Patient was found to have Enterococcus faecalis as per below Patient also had worsening of her plaque psoriasis which needs to follow-up  Hospital Course:  Complicated UTI History of ureteric stent 10/23 Received initially Rocephin which was transitioned to Unasyn-final culture received pansensitive Enterococcus-patient was transitioned to ampicillin to complete 7 days on discharge Discussion with urology yielded decision to follow-up with Dr. 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 No fever no chills no nausea no vomiting  General Exam on discharge  EOMI NCAT no focal deficit can tell year and place but gets confused with time ROM intact no focal deficit Skin exam as above Neurologically intact S1-S2 no murmur Chest is clear  Discharge Instructions   Discharge Instructions     Diet - low sodium heart healthy   Complete by: As directed    Increase activity slowly   Complete by: As directed       Allergies as of 08/31/2022   No Known Allergies      Medication List     TAKE these medications    amLODipine 5 MG tablet Commonly known as: NORVASC Take 5 mg by mouth daily.   ampicillin 500 MG capsule Commonly known as: PRINCIPEN Take 1 capsule (500 mg total) by mouth 4 (four) times daily for 2 days.   triamcinolone cream 0.5 % Commonly known as: KENALOG Apply 1 Application topically daily. Apply to affected areas of the skin 2 times a day as needed for psoriasis and a small amount behind both  ears       No Known Allergies  Contact information for after-discharge care     Destination     [facility] Preferred SNF .   Service: Skilled Nursing Contact information: [place] [phone]                       The results of significant diagnostics from this hospitalization (including imaging, microbiology, ancillary and laboratory) are listed below for reference.    Significant Diagnostic Studies: CT ABDOMEN PELVIS W CONTRAST  Result Date: 08/26/2022 CLINICAL DATA:  Lower abdominal pain. EXAM: CT ABDOMEN AND PELVIS WITH CONTRAST TECHNIQUE: Multidetector CT imaging of the abdomen and pelvis was performed using the standard protocol following bolus administration of intravenous contrast. RADIATION DOSE REDUCTION: This exam was performed according to the departmental dose-optimization program which includes automated exposure control, adjustment of the mA and/or kV according to patient size and/or use of iterative reconstruction technique. CONTRAST:  173m OMNIPAQUE IOHEXOL 300 MG/ML  SOLN COMPARISON:  CT abdomen and pelvis 07/07/2022 FINDINGS: Lower chest: No acute abnormality. Hepatobiliary: No focal liver abnormality is seen. No gallstones, gallbladder wall thickening, or biliary dilatation. Pancreas: Unremarkable. No pancreatic ductal dilatation or surrounding inflammatory changes. Spleen: Normal in size without focal abnormality. Adrenals/Urinary Tract: There are calcifications in the left adrenal gland, unchanged, likely related to prior hemorrhage or infection. Right adrenal gland is within normal limits. Left-sided ureteral stent is in place. There is no hydronephrosis. Cortical and peripelvic cysts are seen throughout the left kidney measuring up to 3.9 cm. Right kidney is within normal limits. Bilateral bladder diverticula are present left-sided bladder diverticulum is mildly hyperdense and unchanged. Nodule can not be excluded in this region. No bladder calculi. There is mild inflammatory stranding surrounding the bladder. Stomach/Bowel: Stomach is within normal limits. No evidence of bowel wall thickening, distention, or inflammatory changes. There is diffuse colonic  diverticulosis. The appendix is not seen. Vascular/Lymphatic: Aortic atherosclerosis. No enlarged abdominal or pelvic lymph nodes. Reproductive: Calcified uterine fibroids are present measuring up to 4 cm. Adnexa are unremarkable. Other: There is a fat containing umbilical hernia. There is no free fluid. Musculoskeletal: Multilevel degenerative changes affect the spine. The bones are osteopenic. IMPRESSION: 1. Mild inflammatory stranding surrounding the bladder worrisome for cystitis. 2. Left-sided ureteral stent in place. No hydronephrosis. 3. Bilateral bladder diverticula are present. There is a left-sided bladder diverticulum which is mildly hyperdense and unchanged. Nodule can not be excluded in this region. 4. Colonic diverticulosis. 5. Uterine fibroids. Aortic Atherosclerosis (ICD10-I70.0). Electronically Signed   By: ARonney AstersM.D.   On: 08/26/2022 18:29   DG Knee Complete 4 Views Right  Result Date: 08/26/2022 CLINICAL DATA:  Right knee pain this morning. Patient fell a few weeks ago. EXAM: RIGHT KNEE - COMPLETE 4+ VIEW COMPARISON:  Radiographs 07/06/2022. FINDINGS: The bones appear diffusely demineralized. There is no evidence of acute fracture or dislocation. Tricompartmental degenerative changes are present with associated meniscal chondrocalcinosis and a small nonspecific knee joint effusion. IMPRESSION: No evidence of acute fracture or dislocation. Tricompartmental degenerative changes and small nonspecific knee joint effusion. Electronically Signed   By: WRichardean SaleM.D.   On: 08/26/2022 13:49    Microbiology: Recent Results (from the past 240 hour(s))  Urine Culture     Status: Abnormal   Collection Time: 08/26/22  5:09 PM   Specimen: Urine, Clean Catch  Result Value Ref Range Status   Specimen Description  Final    URINE, CLEAN CATCH Performed at [facility], [place] [address] [place], [place] [address]    Special Requests   Final    NONE Performed at  [facility], [place] [address], [place], [place] [address]    Culture (A)  Final    >=100,000 COLONIES/mL ENTEROCOCCUS FAECALIS 10,000 COLONIES/mL YEAST    Report Status 08/29/2022 FINAL  Final   Organism ID, Bacteria ENTEROCOCCUS FAECALIS (A)  Final      Susceptibility   Enterococcus faecalis - MIC*    AMPICILLIN <=2 SENSITIVE Sensitive     NITROFURANTOIN <=16 SENSITIVE Sensitive     VANCOMYCIN 1 SENSITIVE Sensitive     * >=100,000 COLONIES/mL ENTEROCOCCUS FAECALIS  Blood culture (routine x 2)     Status: None   Collection Time: 08/26/22  6:30 PM   Specimen: BLOOD  Result Value Ref Range Status   Specimen Description   Final    BLOOD BLOOD RIGHT HAND Performed at [facility] [address], [place], [place] [address]    Special Requests   Final    BOTTLES DRAWN AEROBIC AND ANAEROBIC Blood Culture adequate volume Performed at [facility] [address] [place], [place] [address]    Culture   Final    NO GROWTH 5 DAYS Performed at [facility], [place] [address], [place], [place] [address]    Report Status 08/31/2022 FINAL  Final  Blood culture (routine x 2)     Status: None (Preliminary result)   Collection Time: 08/27/22  5:00 AM   Specimen: BLOOD  Result Value Ref Range Status   Specimen Description   Final    BLOOD LEFT ANTECUBITAL Performed at [facility] [address] [place], [place] [address]    Special Requests   Final    BOTTLES DRAWN AEROBIC AND ANAEROBIC Blood Culture results may not be optimal due to an inadequate volume of blood received in culture bottles Performed at [facility] [address], [place], [place] [address]    Culture   Final    NO GROWTH 4 DAYS Performed at [facility], [place] [address] [place], [place] [address]    Report Status PENDING  Incomplete     Labs: Basic Metabolic Panel: Recent Labs  Lab 08/27/22 0008  08/28/22 0738 08/29/22 0412 08/30/22 0409 08/31/22 0357  NA 140 144 143 140 141  K 4.0 3.7 3.4* 4.1 3.9  CL 104 109 108 107 106  CO2 '25 27 27 26 26  '$ GLUCOSE 105* 106* 95 106* 104*  BUN 11 6* 7* 9 8  CREATININE 0.90 0.79 0.79 0.63 0.95  CALCIUM 9.0 8.7* 8.9 9.1 9.1  MG  --  2.0 2.3 2.1 2.2   Liver Function Tests: Recent Labs  Lab 08/27/22 0008 08/28/22 0738 08/29/22 0412 08/30/22 0409 08/31/22 0357  AST 14* 13* 14* 14* 14*  ALT '14 13 15 13 14  '$ ALKPHOS 101 87 97 83 88  BILITOT 0.3 0.3 0.4 0.3 0.4  PROT 6.7 6.1* 6.8 6.3* 6.6  ALBUMIN 3.0* 2.8* 3.0* 2.7* 2.9*   No results for input(s): "LIPASE", "AMYLASE" in the last 168 hours. Recent Labs  Lab 08/26/22 1652  AMMONIA 15   CBC: Recent Labs  Lab 08/26/22 1412 08/27/22 0008 08/28/22 0343 08/29/22 0412 08/30/22 0409 08/31/22 0357  WBC 21.9* 18.5* 10.3 9.1 11.0* 13.2*  NEUTROABS 16.4*  --  6.6 5.5 7.3 8.1*  HGB 14.5 12.6 11.6* 12.6 12.7 12.9  HCT 49.5* 41.8 38.3 41.6 41.5 43.8  MCV 103.6* 97.0 99.2 97.7 98.3 99.8  PLT 436* 503* 400 422* 375 398   Cardiac Enzymes: No results for input(s): "CKTOTAL", "CKMB", "CKMBINDEX", "TROPONINI" in the last 168 hours. BNP: BNP (last 3 results) No results for input(s): "BNP" in the last 8760 hours.  ProBNP (last 3 results) No results for input(s): "PROBNP" in the last 8760 hours.  CBG: No results for input(s): "GLUCAP" in the last 168 hours.     Signed:  Nita Sells MD   [facility] 08/31/2022, 12:04 PM

## 2022-09-01 LAB — CULTURE, BLOOD (ROUTINE X 2): Culture: NO GROWTH

## 2022-09-01 NOTE — Progress Notes (Signed)
For Anesthesia: PCP - Glenis Smoker, MD  Cardiologist -   Chest x-ray - 07/06/22 in [facility] EKG - 07/07/22 in [facility] Stress Test -  ECHO -  Cardiac Cath -  Pacemaker/ICD device last checked: Pacemaker orders received: Device Rep notified:  Spinal Cord Stimulator:  Sleep Study -  CPAP -   Fasting Blood Sugar -  Checks Blood Sugar _____ times a day Date and result of last Hgb A1c-  Last dose of GLP1 agonist-  GLP1 instructions:   Last dose of SGLT-2 inhibitors-  SGLT-2 instructions:  Blood Thinner Instructions: Aspirin Instructions: Last Dose:  Activity level: Can go up a flight of stairs and activities of daily living without stopping and without chest pain and/or shortness of breath   Able to exercise without chest pain and/or shortness of breath   Unable to go up a flight of stairs without chest pain and/or shortness of breath     Anesthesia review: COPD, HTN  Patient denies shortness of breath, fever, cough and chest pain at PAT appointment   Patient verbalized understanding of instructions reviewed via telephone.

## 2022-09-09 ENCOUNTER — Other Ambulatory Visit: Payer: Self-pay | Admitting: Urology

## 2022-09-09 ENCOUNTER — Encounter (HOSPITAL_COMMUNITY): Payer: Self-pay | Admitting: Urology

## 2022-09-09 NOTE — Progress Notes (Signed)
COVID Vaccine Completed:  Date of COVID positive in last 90 days:  07-26-22  PCP - Sela Hilding, MD Cardiologist -   Chest x-ray - 07-06-22 Epic EKG - 07-07-22 Epic Stress Test -  ECHO -  Cardiac Cath -  Pacemaker/ICD device last checked: Spinal Cord Stimulator:  Bowel Prep -   Sleep Study -  CPAP -   Fasting Blood Sugar -  Checks Blood Sugar _____ times a day  Last dose of GLP1 agonist-  N/A GLP1 instructions:  N/A   Last dose of SGLT-2 inhibitors-  N/A SGLT-2 instructions: N/A  Blood Thinner Instructions:  N/A Aspirin Instructions: Last Dose:  Activity level: Per nursing home patient needs assistance with ambulation. Uses a wheelchair  Anesthesia review:  COPD, Alzheimer's dementia, HTN, CKD.  Recent hospitalization for sepsis due to UTI  Patient denies shortness of breath, fever, cough and chest pain at PAT appointment  Patient verbalized understanding of instructions that were given to them at the PAT appointment. Patient was also instructed that they will need to review over the PAT instructions again at home before surgery.

## 2022-09-09 NOTE — Progress Notes (Signed)
Preop instructions for:    Danielle Dixon  Date of Birth:               08/12/1941        Date of Procedure:   09-16-22 Procedure:     Cystoscopy, Left ureteroscopy and stent placement Surgeon:  Dr. Link Snuffer  Facility contact:     Phone:  [place]: [name] [phone] RN contact name/phone#:                          and Fax #: [phone]   Transportation contact phone#:  Madelynn Done Ph# [phone]  Please send day of procedure:  Current med list  Medications taken the day of procedure (return attached form to hospital) confirm time of nothing by mouth status (return attached form to hospital) Patient Demographic info( to include DNR status, problem list, allergies) Bring Insurance card and picture ID    Time to arrive at [facility]: 9:30 AM   Report to: Admitting (On your left hand side)    Do not eat solid food or drink liquids past midnight the night before your procedure.(To include any tube feedings-must be discontinued)   Take these morning medications only with sips of water.(or give through gastrostomy or feeding tube).  Amlodipine   Note: No Insulin or Diabetic meds should be given or taken the morning of the procedure!  Oral Hygiene is also important to reduce your risk of infection.                                    Remember - BRUSH YOUR TEETH THE MORNING OF SURGERY WITH YOUR REGULAR TOOTHPASTE   DENTURES WILL BE REMOVED PRIOR TO SURGERY PLEASE DO NOT APPLY "Poly grip" OR ADHESIVES!!!  Leave all jewelry and other valuables at place where living( no metal or rings to be worn) No contact lens Women-no make-up, no lotions,perfumes,powders Men-no colognes,lotions   Any questions day of procedure,call  [phone]     Sent from :[facility]                   [place]                   Fax:[phone]   Sent by :   Norvel Richards, RN

## 2022-09-13 NOTE — Progress Notes (Signed)
Spoke with Zanette and she stated the pre op instructions were received at [facility] and she had no questions at this time.

## 2022-09-14 DIAGNOSIS — L409 Psoriasis, unspecified: Secondary | ICD-10-CM | POA: Diagnosis not present

## 2022-09-14 DIAGNOSIS — I10 Essential (primary) hypertension: Secondary | ICD-10-CM | POA: Diagnosis not present

## 2022-09-14 DIAGNOSIS — F0390 Unspecified dementia without behavioral disturbance: Secondary | ICD-10-CM | POA: Diagnosis not present

## 2022-09-14 DIAGNOSIS — Z960 Presence of urogenital implants: Secondary | ICD-10-CM | POA: Diagnosis not present

## 2022-09-14 DIAGNOSIS — M6281 Muscle weakness (generalized): Secondary | ICD-10-CM | POA: Diagnosis not present

## 2022-09-14 DIAGNOSIS — D72829 Elevated white blood cell count, unspecified: Secondary | ICD-10-CM | POA: Diagnosis not present

## 2022-09-15 DIAGNOSIS — L409 Psoriasis, unspecified: Secondary | ICD-10-CM | POA: Diagnosis not present

## 2022-09-15 DIAGNOSIS — J449 Chronic obstructive pulmonary disease, unspecified: Secondary | ICD-10-CM | POA: Diagnosis not present

## 2022-09-15 DIAGNOSIS — I10 Essential (primary) hypertension: Secondary | ICD-10-CM | POA: Diagnosis not present

## 2022-09-15 DIAGNOSIS — R3981 Functional urinary incontinence: Secondary | ICD-10-CM | POA: Diagnosis not present

## 2022-09-15 DIAGNOSIS — F0390 Unspecified dementia without behavioral disturbance: Secondary | ICD-10-CM | POA: Diagnosis not present

## 2022-09-15 DIAGNOSIS — N390 Urinary tract infection, site not specified: Secondary | ICD-10-CM | POA: Diagnosis not present

## 2022-09-16 ENCOUNTER — Encounter (HOSPITAL_COMMUNITY): Payer: Self-pay | Admitting: Urology

## 2022-09-16 ENCOUNTER — Ambulatory Visit (HOSPITAL_COMMUNITY): Payer: Medicare Other | Admitting: Physician Assistant

## 2022-09-16 ENCOUNTER — Encounter (HOSPITAL_COMMUNITY)
Admission: RE | Disposition: A | Discharge status: Home / Self Care | Payer: Self-pay | Source: Ambulatory Visit | Attending: Urology

## 2022-09-16 ENCOUNTER — Ambulatory Visit (HOSPITAL_COMMUNITY): Payer: Medicare Other

## 2022-09-16 ENCOUNTER — Ambulatory Visit (HOSPITAL_COMMUNITY)
Admission: RE | Admit: 2022-09-16 | Discharge: 2022-09-16 | Disposition: A | Discharge status: Home / Self Care | Payer: Medicare Other | Source: Ambulatory Visit | Attending: Urology | Admitting: Urology

## 2022-09-16 ENCOUNTER — Ambulatory Visit (HOSPITAL_BASED_OUTPATIENT_CLINIC_OR_DEPARTMENT_OTHER): Payer: Medicare Other | Admitting: Physician Assistant

## 2022-09-16 DIAGNOSIS — I10 Essential (primary) hypertension: Secondary | ICD-10-CM

## 2022-09-16 DIAGNOSIS — F418 Other specified anxiety disorders: Secondary | ICD-10-CM | POA: Diagnosis not present

## 2022-09-16 DIAGNOSIS — N1330 Unspecified hydronephrosis: Secondary | ICD-10-CM

## 2022-09-16 DIAGNOSIS — N132 Hydronephrosis with renal and ureteral calculous obstruction: Secondary | ICD-10-CM | POA: Insufficient documentation

## 2022-09-16 DIAGNOSIS — J449 Chronic obstructive pulmonary disease, unspecified: Secondary | ICD-10-CM | POA: Diagnosis not present

## 2022-09-16 DIAGNOSIS — N201 Calculus of ureter: Secondary | ICD-10-CM | POA: Diagnosis not present

## 2022-09-16 HISTORY — PX: CYSTOSCOPY/URETEROSCOPY/HOLMIUM LASER/STENT PLACEMENT: SHX6546

## 2022-09-16 HISTORY — DX: Hypokalemia: E87.6

## 2022-09-16 SURGERY — CYSTOSCOPY/URETEROSCOPY/HOLMIUM LASER/STENT PLACEMENT
Anesthesia: General | Site: Ureter | Laterality: Left

## 2022-09-16 MED ORDER — DEXAMETHASONE SODIUM PHOSPHATE 10 MG/ML IJ SOLN
INTRAMUSCULAR | Status: AC
  Filled 2022-09-16: qty 1

## 2022-09-16 MED ORDER — DEXAMETHASONE SODIUM PHOSPHATE 4 MG/ML IJ SOLN
INTRAMUSCULAR | Status: DC | PRN
  Administered 2022-09-16: 8 mg via INTRAVENOUS

## 2022-09-16 MED ORDER — HYDROCODONE-ACETAMINOPHEN 5-325 MG PO TABS: 1.0000 | ORAL_TABLET | ORAL | 0 refills | Status: DC | PRN

## 2022-09-16 MED ORDER — ACETAMINOPHEN 500 MG PO TABS
1000.0000 mg | ORAL_TABLET | Freq: Once | ORAL | Status: DC
  Filled 2022-09-16: qty 2

## 2022-09-16 MED ORDER — ONDANSETRON HCL 4 MG/2ML IJ SOLN
INTRAMUSCULAR | Status: AC
  Filled 2022-09-16: qty 2

## 2022-09-16 MED ORDER — FENTANYL CITRATE (PF) 100 MCG/2ML IJ SOLN
INTRAMUSCULAR | Status: AC
  Filled 2022-09-16: qty 2

## 2022-09-16 MED ORDER — PROPOFOL 10 MG/ML IV BOLUS
INTRAVENOUS | Status: AC
  Filled 2022-09-16: qty 20

## 2022-09-16 MED ORDER — FENTANYL CITRATE PF 50 MCG/ML IJ SOSY: 25.0000 ug | PREFILLED_SYRINGE | INTRAMUSCULAR | Status: DC | PRN

## 2022-09-16 MED ORDER — SODIUM CHLORIDE 0.9 % IR SOLN
Status: DC | PRN
  Administered 2022-09-16: 3000 mL via INTRAVESICAL

## 2022-09-16 MED ORDER — PROPOFOL 10 MG/ML IV BOLUS
INTRAVENOUS | Status: DC | PRN
  Administered 2022-09-16: 50 mg via INTRAVENOUS

## 2022-09-16 MED ORDER — ORAL CARE MOUTH RINSE: 15.0000 mL | Freq: Once | OROMUCOSAL | Status: AC

## 2022-09-16 MED ORDER — FENTANYL CITRATE (PF) 100 MCG/2ML IJ SOLN
INTRAMUSCULAR | Status: DC | PRN
  Administered 2022-09-16 (×3): 25 ug via INTRAVENOUS

## 2022-09-16 MED ORDER — CHLORHEXIDINE GLUCONATE 0.12 % MT SOLN
15.0000 mL | Freq: Once | OROMUCOSAL | Status: AC
  Administered 2022-09-16: 15 mL via OROMUCOSAL

## 2022-09-16 MED ORDER — LACTATED RINGERS IV SOLN: INTRAVENOUS | Status: DC

## 2022-09-16 MED ORDER — ONDANSETRON HCL 4 MG/2ML IJ SOLN
INTRAMUSCULAR | Status: DC | PRN
  Administered 2022-09-16: 4 mg via INTRAVENOUS

## 2022-09-16 MED ORDER — LIDOCAINE HCL (PF) 2 % IJ SOLN
INTRAMUSCULAR | Status: AC
  Filled 2022-09-16: qty 5

## 2022-09-16 MED ORDER — CIPROFLOXACIN IN D5W 400 MG/200ML IV SOLN
400.0000 mg | INTRAVENOUS | Status: AC
  Administered 2022-09-16: 400 mg via INTRAVENOUS
  Filled 2022-09-16: qty 200

## 2022-09-16 MED ORDER — AMOXICILLIN-POT CLAVULANATE 875-125 MG PO TABS: 1.0000 | ORAL_TABLET | Freq: Two times a day (BID) | ORAL | 0 refills | Status: DC

## 2022-09-16 MED ORDER — IOHEXOL 300 MG/ML  SOLN
INTRAMUSCULAR | Status: DC | PRN
  Administered 2022-09-16: 10 mL

## 2022-09-16 MED ORDER — ONDANSETRON HCL 4 MG/2ML IJ SOLN: 4.0000 mg | Freq: Once | INTRAMUSCULAR | Status: DC | PRN

## 2022-09-16 SURGICAL SUPPLY — 23 items
BAG URO CATCHER STRL LF (MISCELLANEOUS) ×1 IMPLANT
BASKET LASER NITINOL 1.9FR (BASKET) IMPLANT
BASKET ZERO TIP NITINOL 2.4FR (BASKET) IMPLANT
CATH URETERAL DUAL LUMEN 10F (MISCELLANEOUS) IMPLANT
CATH URETL OPEN END 6FR 70 (CATHETERS) ×1 IMPLANT
CLOTH BEACON ORANGE TIMEOUT ST (SAFETY) ×1 IMPLANT
EXTRACTOR STONE 1.7FRX115CM (UROLOGICAL SUPPLIES) IMPLANT
GLOVE BIO SURGEON STRL SZ7.5 (GLOVE) ×1 IMPLANT
GOWN STRL REUS W/ TWL XL LVL3 (GOWN DISPOSABLE) ×1 IMPLANT
GOWN STRL REUS W/TWL XL LVL3 (GOWN DISPOSABLE) ×1
GUIDEWIRE ANG ZIPWIRE 038X150 (WIRE) IMPLANT
GUIDEWIRE STR DUAL SENSOR (WIRE) ×1 IMPLANT
KIT TURNOVER KIT A (KITS) IMPLANT
LASER FIB FLEXIVA PULSE ID 365 (Laser) IMPLANT
MANIFOLD NEPTUNE II (INSTRUMENTS) ×1 IMPLANT
PACK CYSTO (CUSTOM PROCEDURE TRAY) ×1 IMPLANT
SHEATH NAVIGATOR HD 11/13X28 (SHEATH) IMPLANT
SHEATH NAVIGATOR HD 11/13X36 (SHEATH) IMPLANT
STENT URET 6FRX24 CONTOUR (STENTS) IMPLANT
TRACTIP FLEXIVA PULS ID 200XHI (Laser) IMPLANT
TRACTIP FLEXIVA PULSE ID 200 (Laser)
TUBING CONNECTING 10 (TUBING) ×1 IMPLANT
TUBING UROLOGY SET (TUBING) ×1 IMPLANT

## 2022-09-16 NOTE — Transfer of Care (Signed)
Immediate Anesthesia Transfer of Care Note  Patient: Danielle Dixon  Procedure(s) Performed: Procedure(s) with comments: CYSTOSCOPY LEFT URETEROSCOPY/STENT PLACEMENT (Left) - 1 HR FOR CASE  Patient Location: PACU  Anesthesia Type:General  Level of Consciousness: Patient easily awoken, sedated, comfortable, cooperative, following commands, responds to stimulation.   Airway & Oxygen Therapy: Patient spontaneously breathing, ventilating well, oxygen via simple oxygen mask.  Post-op Assessment: Report given to PACU RN, vital signs reviewed and stable, moving all extremities.   Post vital signs: Reviewed and stable.  Complications: No apparent anesthesia complications  Last Vitals:  Vitals Value Taken Time  BP 132/68 09/16/22 1358  Temp    Pulse 85 09/16/22 1400  Resp 20 09/16/22 1400  SpO2 99 % 09/16/22 1400  Vitals shown include unvalidated device data.  Last Pain:  Vitals:   09/16/22 1032  TempSrc:   PainSc: 0-No pain         Complications: No notable events documented.

## 2022-09-16 NOTE — Anesthesia Preprocedure Evaluation (Addendum)
Anesthesia Evaluation  Patient identified by MRN, date of birth, ID band Patient awake and Patient confused    Reviewed: Allergy & Precautions, NPO status , Patient's Chart, lab work & pertinent test results  Airway Mallampati: II  TM Distance: >3 FB Neck ROM: Full    Dental  (+) Dental Advisory Given, Poor Dentition, Chipped,    Pulmonary COPD   Pulmonary exam normal breath sounds clear to auscultation       Cardiovascular hypertension, Pt. on medications Normal cardiovascular exam Rhythm:Regular Rate:Normal     Neuro/Psych  PSYCHIATRIC DISORDERS Anxiety Depression   Dementia AD    GI/Hepatic negative GI ROS, Neg liver ROS,,,  Endo/Other  negative endocrine ROS    Renal/GU Renal disease (LEFT URETERAL STONE)     Musculoskeletal  (+) Arthritis , Rheumatoid disorders,    Abdominal   Peds  Hematology negative hematology ROS (+)   Anesthesia Other Findings Day of surgery medications reviewed with the patient.  Reproductive/Obstetrics                             Anesthesia Physical Anesthesia Plan  ASA: 3  Anesthesia Plan: General   Post-op Pain Management: Tylenol PO (pre-op)*   Induction: Intravenous  PONV Risk Score and Plan: 4 or greater and Dexamethasone, Ondansetron and Treatment may vary due to age or medical condition  Airway Management Planned: LMA  Additional Equipment:   Intra-op Plan:   Post-operative Plan: Extubation in OR  Informed Consent: I have reviewed the patients History and Physical, chart, labs and discussed the procedure including the risks, benefits and alternatives for the proposed anesthesia with the patient or authorized representative who has indicated his/her understanding and acceptance.     Dental advisory given and Consent reviewed with POA  Plan Discussed with: CRNA  Anesthesia Plan Comments: (Telephone consent with patient's sister)         Anesthesia Quick Evaluation

## 2022-09-16 NOTE — Discharge Instructions (Signed)

## 2022-09-16 NOTE — H&P (Signed)
H&P  Chief Complaint: left hydronephrosis, ureteral calculus  History of Present Illness: 82 year old female status post urgent left ureteral stent placement for a punctate distal left ureteral calculus and left hydronephrosis with concerns for sepsis.  Presents for ureteroscopy with stent removal and replacement.  Past Medical History:  Diagnosis Date   Danielle Dixon (Danielle Dixon)    per records from [facility]   Anemia    Anxiety    Arthritis    ra   Constipation    COPD (chronic obstructive pulmonary disease) ([name])    Dixon ([name])    Dixon ([name])    Depression    Dyspnea 06/25/2013   Hypertension    Hypokalemia    Leukocytosis    Leukocytosis, unspecified 06/25/2013   Leukocytosis, unspecified 06/25/2013   Psoriasis    Rheumatoid arthritis ([place])    Unspecified vitamin D deficiency 06/25/2013   Vitamin B12 deficiency    Past Surgical History:  Procedure Laterality Date   APPENDECTOMY     CYSTOSCOPY W/ URETERAL STENT PLACEMENT Bilateral 07/10/2019   Procedure: CYSTOSCOPY WITH RETROGRADE PYELOGRAM AND BILATERAL URETERAL STENT PLACEMENT;  Surgeon: Lucas Mallow, MD;  Location: [facility];  Service: Urology;  Laterality: Bilateral;   CYSTOSCOPY W/ URETERAL STENT PLACEMENT Left 07/07/2022   Procedure: CYSTOSCOPY WITH RETROGRADE PYELOGRAM/URETERAL STENT PLACEMENT;  Surgeon: Festus Aloe, MD;  Location: [facility];  Service: Urology;  Laterality: Left;   CYSTOSCOPY/URETEROSCOPY/HOLMIUM LASER/STENT PLACEMENT Bilateral 07/29/2019   Procedure: CYSTOSCOPY LEFT RETROGRADE PYELOGRAM LEFT URETEROSCOPY HOLMIUM LASER LEFT STENT PLACEMENT POSSIBLE RIGHT URETEROSCOPY LASER LITHOTRIPSY AND STENT;  Surgeon: Lucas Mallow, MD;  Location: [facility];  Service: Urology;  Laterality: Bilateral;   IR URETERAL STENT RIGHT NEW ACCESS W/O SEP NEPHROSTOMY CATH  09/18/2019   knee replacment Left    NEPHROLITHOTOMY Right 09/18/2019   Procedure: NEPHROLITHOTOMY PERCUTANEOUS;  Surgeon: Lucas Mallow, MD;  Location: [facility];  Service: Urology;  Laterality: Right;   TOE SURGERY      Home Medications:  Medications Prior to Admission  Medication Sig Dispense Refill Last Dose   acetaminophen (TYLENOL) 325 MG tablet Take 650 mg by mouth every 4 (four) hours as needed for mild pain (max 3g/24 hrs.).   Unknown   amLODipine (NORVASC) 5 MG tablet Take 5 mg by mouth in the morning.   Unknown   triamcinolone cream (KENALOG) 0.5 % Apply 1 Application topically every 12 (twelve) hours as needed (psoriasis). Apply to lower back  & affected areas every 12 hours as needed for psoriasis   Unknown   Allergies: No Known Allergies  Family History  Problem Relation Age of Onset   Cancer Mother        breast cancer   Cancer Father        lung   Social History:  reports that she has never smoked. She has never used smokeless tobacco. She reports that she does not drink alcohol and does not use drugs.  ROS: A complete review of systems was performed.  All systems are negative except for pertinent findings as noted. ROS   Physical Exam:  Vital signs in last 24 hours: Temp:  [97.8 F (36.6 C)] 97.8 F (36.6 C) (01/05 0953) Pulse Rate:  [74] 74 (01/05 0953) Resp:  [17] 17 (01/05 0953) BP: (114)/(62) 114/62 (01/05 0953) SpO2:  [98 %] 98 % (01/05 0953) Weight:  [90.7 kg] 90.7 kg (01/05 1032) General:  Alert and oriented, No acute distress HEENT: Normocephalic, atraumatic Neck: No JVD  or lymphadenopathy Cardiovascular: Regular rate and rhythm Lungs: Regular rate and effort Abdomen: Soft, nontender, nondistended, no abdominal masses Back: No CVA tenderness Extremities: No edema Neurologic: Grossly intact  Laboratory Data:  No results found for this or any previous visit (from the past 24 hour(s)). No results found for this or any previous visit (from the past 240 hour(s)). Creatinine: No results for input(s): "CREATININE" in the last 168 hours.  Impression/Assessment:  Left  hydronephrosis, ureteral calculus  Plan:  Proceed with cystoscopy with left ureteroscopy with laser lithotripsy versus stone extraction and ureteral stent exchange.  [name], III 09/16/2022, 12:30 PM

## 2022-09-16 NOTE — Op Note (Signed)
Operative Note  Preoperative diagnosis:  1.  Left hydronephrosis with ureteral calculus  Postoperative diagnosis: 1.  Left hydronephrosis  Procedure(s): 1.  Diascopy with left retrograde pyelogram, left diagnostic ureteroscopy, left ureteral stent exchange  Surgeon: Link Snuffer, MD  Assistants: None  Anesthesia: General   Complications: None immediate  EBL: Minimal  Specimens: 1.  None  Drains/Catheters: 1.  6 x 24 double-J ureteral stent  Intraoperative findings: 1.  Normal urethra and bladder 2.  Inflammatory change within the ureter from the stent being in place.  No ureteral calculi.  No renal calculi.  Retrograde pyelogram revealed some fullness in the left kidney versus mild hydronephrosis.  No filling defect.  Indication: 82 year old female status post urgent left ureteral stent placement for small distal left ureteral calculi and hydronephrosis with concern for sepsis.  Presents for the previously mentioned operation.  Description of procedure:  The patient was identified and consent was obtained.  The patient was taken to the operating room and placed in the supine position.  The patient was placed under normal anesthesia.  Perioperative antibiotics were administered.  The patient was placed in dorsal lithotomy.  Patient was prepped and draped in a standard sterile fashion and a timeout was performed.  A 21 French rigid cystoscope was advanced into the urethra and into the bladder.  Complete cystoscopy was performed with no abnormal findings.  The stent was grasped and pulled just beyond the urethral meatus.  A sensor wire was advanced through the stent and then into the kidney under fluoroscopic guidance.  Stent was withdrawn.  Semirigid ureteroscopy was performed alongside the wire up to the renal pelvis.  No stones were encountered.  She had some ureteral edema from the stent being in place but no tumors or masses.  I shot a retrograde pyelogram through the scope with  findings noted above and withdrew the scope.  11 x 13 ureteral access sheath was advanced over the wire under continuous fluoroscopic guidance up into the proximal ureter.  Inner sheath was withdrawn.  Second wire was advanced through the sheath and into the kidney.  Sheath was withdrawn.  1 the wires was secured to the drape as a safety wire.  The other wire was used to advance the 11 x 13 ureteral access sheath over the wire under continuous fluoroscopic guidance up to the proximal ureter.  Inner sheath and wire were withdrawn.  Digital ureteroscopy was performed and there were no stones encountered within the kidney.  I withdrew the scope along with the access sheath visualizing the ureter upon removal.  Again there were no ureteral calculi and no ureteral injury was identified.  I backloaded the wire onto the rigid cystoscope and advanced that into the bladder followed by routine placement of a 6 x 24 double-J ureteral stent.  Fluoroscopy confirmed proximal placement and direct visualization confirmed a good coil within the bladder.  I drained the bladder and withdrew the scope.  Patient tolerated the procedure well was stable postoperative.  Plan: Follow-up in 1 week for stent removal.

## 2022-09-16 NOTE — Anesthesia Procedure Notes (Signed)
Procedure Name: LMA Insertion Date/Time: 09/16/2022 1:03 PM  Performed by: Deliah Boston, CRNAPre-anesthesia Checklist: Patient identified, Emergency Drugs available, Suction available and Patient being monitored Patient Re-evaluated:Patient Re-evaluated prior to induction Oxygen Delivery Method: Circle system utilized Preoxygenation: Pre-oxygenation with 100% oxygen Induction Type: IV induction Ventilation: Mask ventilation without difficulty LMA: LMA inserted LMA Size: 4.0 Number of attempts: 1 Placement Confirmation: positive ETCO2 and breath sounds checked- equal and bilateral Tube secured with: Tape Dental Injury: Teeth and Oropharynx as per pre-operative assessment

## 2022-09-18 NOTE — Anesthesia Postprocedure Evaluation (Signed)
Anesthesia Post Note  Patient: Danielle Dixon  Procedure(s) Performed: CYSTOSCOPY LEFT URETEROSCOPY/STENT PLACEMENT (Left: Ureter)     Patient location during evaluation: PACU Anesthesia Type: General Level of consciousness: awake and alert Pain management: pain level controlled Vital Signs Assessment: post-procedure vital signs reviewed and stable Respiratory status: spontaneous breathing, nonlabored ventilation, respiratory function stable and patient connected to nasal cannula oxygen Cardiovascular status: blood pressure returned to baseline and stable Postop Assessment: no apparent nausea or vomiting Anesthetic complications: no   No notable events documented.  Last Vitals:  Vitals:   09/16/22 1430 09/16/22 1445  BP: (!) 145/72 (!) 144/70  Pulse: 79   Resp: 19 20  Temp:  36.7 C  SpO2: 90% 93%    Last Pain:  Vitals:   09/16/22 1445  TempSrc:   PainSc: 0-No pain                 Santa Lighter

## 2022-09-19 ENCOUNTER — Encounter (HOSPITAL_COMMUNITY): Payer: Self-pay | Admitting: Urology

## 2022-09-21 DIAGNOSIS — L400 Psoriasis vulgaris: Secondary | ICD-10-CM | POA: Diagnosis not present

## 2022-09-21 DIAGNOSIS — Z79899 Other long term (current) drug therapy: Secondary | ICD-10-CM | POA: Diagnosis not present

## 2022-09-22 DIAGNOSIS — L409 Psoriasis, unspecified: Secondary | ICD-10-CM | POA: Diagnosis not present

## 2022-09-22 DIAGNOSIS — M1710 Unilateral primary osteoarthritis, unspecified knee: Secondary | ICD-10-CM | POA: Diagnosis not present

## 2022-09-23 DIAGNOSIS — N202 Calculus of kidney with calculus of ureter: Secondary | ICD-10-CM | POA: Diagnosis not present

## 2022-09-26 DIAGNOSIS — J449 Chronic obstructive pulmonary disease, unspecified: Secondary | ICD-10-CM | POA: Diagnosis not present

## 2022-09-26 DIAGNOSIS — G309 Alzheimer's disease, unspecified: Secondary | ICD-10-CM | POA: Diagnosis not present

## 2022-09-26 DIAGNOSIS — I10 Essential (primary) hypertension: Secondary | ICD-10-CM | POA: Diagnosis not present

## 2022-09-26 DIAGNOSIS — Z0000 Encounter for general adult medical examination without abnormal findings: Secondary | ICD-10-CM | POA: Diagnosis not present

## 2022-10-03 DIAGNOSIS — L409 Psoriasis, unspecified: Secondary | ICD-10-CM | POA: Diagnosis not present

## 2022-10-03 DIAGNOSIS — I10 Essential (primary) hypertension: Secondary | ICD-10-CM | POA: Diagnosis not present

## 2022-10-03 DIAGNOSIS — M1710 Unilateral primary osteoarthritis, unspecified knee: Secondary | ICD-10-CM | POA: Diagnosis not present

## 2022-10-03 DIAGNOSIS — Z960 Presence of urogenital implants: Secondary | ICD-10-CM | POA: Diagnosis not present

## 2022-10-11 DIAGNOSIS — J449 Chronic obstructive pulmonary disease, unspecified: Secondary | ICD-10-CM | POA: Diagnosis not present

## 2022-10-11 DIAGNOSIS — I10 Essential (primary) hypertension: Secondary | ICD-10-CM | POA: Diagnosis not present

## 2022-10-20 DIAGNOSIS — Z960 Presence of urogenital implants: Secondary | ICD-10-CM | POA: Diagnosis not present

## 2022-10-20 DIAGNOSIS — F0390 Unspecified dementia without behavioral disturbance: Secondary | ICD-10-CM | POA: Diagnosis not present

## 2022-10-20 DIAGNOSIS — L409 Psoriasis, unspecified: Secondary | ICD-10-CM | POA: Diagnosis not present

## 2022-10-27 DIAGNOSIS — M1710 Unilateral primary osteoarthritis, unspecified knee: Secondary | ICD-10-CM | POA: Diagnosis not present

## 2022-10-27 DIAGNOSIS — F0390 Unspecified dementia without behavioral disturbance: Secondary | ICD-10-CM | POA: Diagnosis not present

## 2022-10-27 DIAGNOSIS — I10 Essential (primary) hypertension: Secondary | ICD-10-CM | POA: Diagnosis not present

## 2022-10-27 DIAGNOSIS — J449 Chronic obstructive pulmonary disease, unspecified: Secondary | ICD-10-CM | POA: Diagnosis not present

## 2022-11-10 DIAGNOSIS — I10 Essential (primary) hypertension: Secondary | ICD-10-CM | POA: Diagnosis not present

## 2022-11-10 DIAGNOSIS — J449 Chronic obstructive pulmonary disease, unspecified: Secondary | ICD-10-CM | POA: Diagnosis not present

## 2022-11-11 DIAGNOSIS — N202 Calculus of kidney with calculus of ureter: Secondary | ICD-10-CM | POA: Diagnosis not present

## 2022-11-11 DIAGNOSIS — N130 Hydronephrosis with ureteropelvic junction obstruction: Secondary | ICD-10-CM | POA: Diagnosis not present

## 2022-11-21 DIAGNOSIS — Z79899 Other long term (current) drug therapy: Secondary | ICD-10-CM | POA: Diagnosis not present

## 2022-11-21 DIAGNOSIS — L400 Psoriasis vulgaris: Secondary | ICD-10-CM | POA: Diagnosis not present

## 2022-11-22 DIAGNOSIS — Z23 Encounter for immunization: Secondary | ICD-10-CM | POA: Diagnosis not present

## 2022-11-24 DIAGNOSIS — N130 Hydronephrosis with ureteropelvic junction obstruction: Secondary | ICD-10-CM | POA: Diagnosis not present

## 2022-11-24 DIAGNOSIS — I10 Essential (primary) hypertension: Secondary | ICD-10-CM | POA: Diagnosis not present

## 2022-11-24 DIAGNOSIS — N1339 Other hydronephrosis: Secondary | ICD-10-CM | POA: Diagnosis not present

## 2022-11-24 DIAGNOSIS — M6281 Muscle weakness (generalized): Secondary | ICD-10-CM | POA: Diagnosis not present

## 2022-11-24 DIAGNOSIS — M1710 Unilateral primary osteoarthritis, unspecified knee: Secondary | ICD-10-CM | POA: Diagnosis not present

## 2022-11-24 DIAGNOSIS — R3981 Functional urinary incontinence: Secondary | ICD-10-CM | POA: Diagnosis not present

## 2022-11-24 DIAGNOSIS — F0390 Unspecified dementia without behavioral disturbance: Secondary | ICD-10-CM | POA: Diagnosis not present

## 2022-11-24 DIAGNOSIS — J449 Chronic obstructive pulmonary disease, unspecified: Secondary | ICD-10-CM | POA: Diagnosis not present

## 2022-11-30 DIAGNOSIS — N201 Calculus of ureter: Secondary | ICD-10-CM | POA: Diagnosis not present

## 2022-12-01 ENCOUNTER — Other Ambulatory Visit: Payer: Self-pay | Admitting: Urology

## 2022-12-07 DIAGNOSIS — G309 Alzheimer's disease, unspecified: Secondary | ICD-10-CM | POA: Diagnosis not present

## 2022-12-07 DIAGNOSIS — R488 Other symbolic dysfunctions: Secondary | ICD-10-CM | POA: Diagnosis not present

## 2022-12-09 DIAGNOSIS — J449 Chronic obstructive pulmonary disease, unspecified: Secondary | ICD-10-CM | POA: Diagnosis not present

## 2022-12-09 DIAGNOSIS — I10 Essential (primary) hypertension: Secondary | ICD-10-CM | POA: Diagnosis not present

## 2022-12-29 DIAGNOSIS — N202 Calculus of kidney with calculus of ureter: Secondary | ICD-10-CM | POA: Diagnosis not present

## 2022-12-29 DIAGNOSIS — R3981 Functional urinary incontinence: Secondary | ICD-10-CM | POA: Diagnosis not present

## 2022-12-29 DIAGNOSIS — F0390 Unspecified dementia without behavioral disturbance: Secondary | ICD-10-CM | POA: Diagnosis not present

## 2022-12-29 DIAGNOSIS — J449 Chronic obstructive pulmonary disease, unspecified: Secondary | ICD-10-CM | POA: Diagnosis not present

## 2022-12-29 DIAGNOSIS — I10 Essential (primary) hypertension: Secondary | ICD-10-CM | POA: Diagnosis not present

## 2022-12-30 DIAGNOSIS — N1330 Unspecified hydronephrosis: Secondary | ICD-10-CM | POA: Diagnosis not present

## 2022-12-30 DIAGNOSIS — R911 Solitary pulmonary nodule: Secondary | ICD-10-CM | POA: Diagnosis not present

## 2023-01-02 NOTE — Progress Notes (Unsigned)
Synopsis: Referred for pulmonary nodule by Shon Hale, *  Subjective:   PATIENT ID: Danielle Dixon GENDER: female DOB: Aug 05, 1941, MRN: [id number]  No chief complaint on file.  82yF with history of AlzD, COPD, HTN, RA referred for pulmonary nodule   Has upcoming cystoscopy/stent placement 01/09/23 with Dr. Alvester Morin. Tolerated general anesthesia 09/16/22 for cysto.   Otherwise pertinent review of systems is negative.  Past Medical History:  Diagnosis Date   Alzheimer's dementia (HCC)    per records from [facility]   Anemia    Anxiety    Arthritis    ra   Constipation    COPD (chronic obstructive pulmonary disease) (HCC)    Dementia (HCC)    Dementia (HCC)    Depression    Dyspnea 06/25/2013   Hypertension    Hypokalemia    Leukocytosis    Leukocytosis, unspecified 06/25/2013   Leukocytosis, unspecified 06/25/2013   Psoriasis    Rheumatoid arthritis (HCC)    Unspecified vitamin D deficiency 06/25/2013   Vitamin B12 deficiency      Family History  Problem Relation Age of Onset   Cancer Mother        breast cancer   Cancer Father        lung     Past Surgical History:  Procedure Laterality Date   APPENDECTOMY     CYSTOSCOPY W/ URETERAL STENT PLACEMENT Bilateral 07/10/2019   Procedure: CYSTOSCOPY WITH RETROGRADE PYELOGRAM AND BILATERAL URETERAL STENT PLACEMENT;  Surgeon: Crista Elliot, MD;  Location[facility];  Service: Urology;  Laterality: Bilateral;   CYSTOSCOPY W/ URETERAL STENT PLACEMENT Left 07/07/2022   Procedure: CYSTOSCOPY WITH RETROGRADE PYELOGRAM/URETERAL STENT PLACEMENT;  Surgeon: Jerilee Field, MD;  Location: [facility] OR;  Service: Urology;  Laterality: Left;   CYSTOSCOPY/URETEROSCOPY/HOLMIUM LASER/STENT PLACEMENT Bilateral 07/29/2019   Procedure: CYSTOSCOPY LEFT RETROGRADE PYELOGRAM LEFT URETEROSCOPY HOLMIUM LASER LEFT STENT PLACEMENT POSSIBLE RIGHT URETEROSCOPY LASER LITHOTRIPSY AND STENT;  Surgeon: Crista Elliot, MD;  Location[facility];   Service: Urology;  Laterality: Bilateral;   CYSTOSCOPY/URETEROSCOPY/HOLMIUM LASER/STENT PLACEMENT Left 09/16/2022   Procedure: CYSTOSCOPY LEFT URETEROSCOPY/STENT PLACEMENT;  Surgeon: Crista Elliot, MD;  [facility];  Service: Urology;  Laterality: Left;  1 HR FOR CASE   IR URETERAL STENT RIGHT NEW ACCESS W/O SEP NEPHROSTOMY CATH  09/18/2019   knee replacment Left    NEPHROLITHOTOMY Right 09/18/2019   Procedure: NEPHROLITHOTOMY PERCUTANEOUS;  Surgeon: Crista Elliot, MD;  Location[facility];  Service: Urology;  Laterality: Right;   TOE SURGERY      Social History   Socioeconomic History   Marital status: Divorced    Spouse name: Not on file   Number of children: Not on file   Years of education: Not on file   Highest education level: Not on file  Occupational History   Not on file  Tobacco Use   Smoking status: Never   Smokeless tobacco: Never  Vaping Use   Vaping Use: Never used  Substance and Sexual Activity   Alcohol use: No   Drug use: No   Sexual activity: Not on file  Other Topics Concern   Not on file  Social History Narrative   Not on file   Social Determinants of Health   Financial Resource Strain: Not on file  Food Insecurity: Not on file  Transportation Needs: Not on file  Physical Activity: Not on file  Stress: Not on file  Social Connections: Not on file  Intimate  Partner Violence: Not on file     No Known Allergies   Outpatient Medications Prior to Visit  Medication Sig Dispense Refill   acetaminophen (TYLENOL) 325 MG tablet Take 650 mg by mouth every 4 (four) hours as needed for mild pain (max 3g/24 hrs.).     amLODipine (NORVASC) 5 MG tablet Take 5 mg by mouth in the morning.     amoxicillin-clavulanate (AUGMENTIN) 875-125 MG tablet Take 1 tablet by mouth 2 (two) times daily. 14 tablet 0   HYDROcodone-acetaminophen (NORCO/VICODIN) 5-325 MG tablet Take 1 tablet by mouth every 4 (four) hours as needed for moderate pain. 8 tablet 0    triamcinolone cream (KENALOG) 0.5 % Apply 1 Application topically every 12 (twelve) hours as needed (psoriasis). Apply to lower back  & affected areas every 12 hours as needed for psoriasis     No facility-administered medications prior to visit.       Objective:   Physical Exam:  General appearance: 82 y.o., female, female, NAD, conversant  Eyes: anicteric sclerae; PERRL, tracking appropriately HENT: NCAT; MMM Neck: Trachea midline; no lymphadenopathy, no JVD Lungs: CTAB, no crackles, no wheeze, with normal respiratory effort CV: RRR, no murmur  Abdomen: Soft, non-tender; non-distended, BS present  Extremities: No peripheral edema, warm Skin: Normal turgor and texture; no rash Psych: Appropriate affect Neuro: Alert and oriented to person and place, no focal deficit     There were no vitals filed for this visit.   on *** LPM *** RA BMI Readings from Last 3 Encounters:  09/16/22 32.28 kg/m  08/27/22 32.02 kg/m  07/07/22 32.28 kg/m   Wt Readings from Last 3 Encounters:  09/16/22 200 lb (90.7 kg)  08/27/22 198 lb 6.6 oz (90 kg)  07/07/22 200 lb (90.7 kg)     CBC    Component Value Date/Time   WBC 13.2 (H) 08/31/2022 0357   RBC 4.39 08/31/2022 0357   HGB 12.9 08/31/2022 0357   HGB 13.7 10/09/2013 1407   HCT 43.8 08/31/2022 0357   HCT 42.3 10/09/2013 1407   PLT 398 08/31/2022 0357   PLT 459 (H) 10/09/2013 1407   MCV 99.8 08/31/2022 0357   MCV 94.5 10/09/2013 1407   MCH 29.4 08/31/2022 0357   MCHC 29.5 (L) 08/31/2022 0357   RDW 14.7 08/31/2022 0357   RDW 17.4 (H) 10/09/2013 1407   LYMPHSABS 3.7 08/31/2022 0357   LYMPHSABS 2.7 10/09/2013 1407   MONOABS 0.7 08/31/2022 0357   MONOABS 0.5 10/09/2013 1407   EOSABS 0.6 (H) 08/31/2022 0357   EOSABS 0.3 10/09/2013 1407   BASOSABS 0.1 08/31/2022 0357   BASOSABS 0.0 10/09/2013 1407    ***  Chest Imaging: CT A/P 12/04/22 with spiculated appearing nodule LLL incompletely imaged. Not seen on prior CT A/P however nodule is  located superior to prior imaging.   Pulmonary Functions Testing Results:     No data to display              Assessment & Plan:    Plan:      Omar Person, MD [facility] 01/02/2023 6:37 AM

## 2023-01-03 ENCOUNTER — Ambulatory Visit (INDEPENDENT_AMBULATORY_CARE_PROVIDER_SITE_OTHER): Payer: Medicare Other | Admitting: Student

## 2023-01-03 ENCOUNTER — Encounter: Payer: Self-pay | Admitting: Student

## 2023-01-03 VITALS — BP 124/68 | HR 87 | Temp 98.7°F | Ht 66.0 in

## 2023-01-03 DIAGNOSIS — R911 Solitary pulmonary nodule: Secondary | ICD-10-CM | POA: Diagnosis not present

## 2023-01-03 NOTE — Patient Instructions (Signed)
-   you'll be called to schedule ct chest in 2-3 weeks - clinic visit in 4 weeks to go over results

## 2023-01-04 DIAGNOSIS — I10 Essential (primary) hypertension: Secondary | ICD-10-CM | POA: Diagnosis not present

## 2023-01-04 DIAGNOSIS — J449 Chronic obstructive pulmonary disease, unspecified: Secondary | ICD-10-CM | POA: Diagnosis not present

## 2023-01-05 ENCOUNTER — Encounter (HOSPITAL_COMMUNITY): Payer: Self-pay | Admitting: Urology

## 2023-01-06 ENCOUNTER — Encounter (HOSPITAL_COMMUNITY): Payer: Self-pay | Admitting: Urology

## 2023-01-06 ENCOUNTER — Other Ambulatory Visit: Payer: Self-pay

## 2023-01-06 NOTE — Anesthesia Preprocedure Evaluation (Signed)
Anesthesia Evaluation  Patient identified by MRN, date of birth, ID band Patient awake    Reviewed: Allergy & Precautions, NPO status , Patient's Chart, lab work & pertinent test results  Airway Mallampati: III  TM Distance: >3 FB Neck ROM: Full    Dental  (+) Chipped, Dental Advisory Given,    Pulmonary COPD Known LLL mass, being worked up currently by pulm   Pulmonary exam normal breath sounds clear to auscultation       Cardiovascular hypertension, Pt. on medications Normal cardiovascular exam Rhythm:Regular Rate:Normal     Neuro/Psych  PSYCHIATRIC DISORDERS Anxiety Depression   Dementia Alzheimer's negative neurological ROS     GI/Hepatic negative GI ROS, Neg liver ROS,,,  Endo/Other  Obesity BMI 32  Renal/GU negative Renal ROS  negative genitourinary   Musculoskeletal  (+) Arthritis , Osteoarthritis and Rheumatoid disorders,    Abdominal  (+) + obese  Peds  Hematology negative hematology ROS (+)   Anesthesia Other Findings   Reproductive/Obstetrics negative OB ROS                             Anesthesia Physical Anesthesia Plan  ASA: 3  Anesthesia Plan: General   Post-op Pain Management: Tylenol PO (pre-op)*   Induction: Intravenous  PONV Risk Score and Plan: Ondansetron, Dexamethasone and Treatment may vary due to age or medical condition  Airway Management Planned: LMA  Additional Equipment: None  Intra-op Plan:   Post-operative Plan: Extubation in OR  Informed Consent: I have reviewed the patients History and Physical, chart, labs and discussed the procedure including the risks, benefits and alternatives for the proposed anesthesia with the patient or authorized representative who has indicated his/her understanding and acceptance.     Dental advisory given and Consent reviewed with POA  Plan Discussed with: CRNA  Anesthesia Plan Comments: (Last cysto: LMA  Size: 4.0 Number of attempts: 1  Dementia- nephew signing consents)        Anesthesia Quick Evaluation

## 2023-01-06 NOTE — Progress Notes (Signed)
Case: [id number] Date/Time: 01/09/23 1015   Procedure: CYSTOSCOPY LEFT URETEROSCOPY/HOLMIUM LASER/STENT PLACEMENT (Left) - 1 HR FOR CASE   Anesthesia type: General   Pre-op diagnosis: LEFT URETERAL STONE   [facility]   Surgeons: Crista Elliot, MD       DISCUSSION: Danielle Dixon is an 82 yo female who is SDW prior to surgery listed above. She has a hx of Alzheimer's disease and lives in a nursing facility, COPD, HTN, RA, psoriasis. She had a ureteral stent removal and exchange on 09/16/22 which was without issues.  She saw Pulmonology on 01/03/23 due to a lung nodule but otherwise has no significant respiratory issues. Tolerated general anesthesia 09/16/22 for cysto.   VS: There were no vitals taken for this visit.  PROVIDERS: Shon Hale, MD Pulmonologist: Dr Felisa Bonier  LABS: Labs reviewed: Acceptable for surgery. (all labs ordered are listed, but only abnormal results are displayed)  Labs Reviewed - No data to display   IMAGES:  CXR 07/06/22:  IMPRESSION: Probable trace pleural effusions. No focal airspace opacity.   EKG 07/06/22  SR Borderline left axis deviation Abnormal R wave progression, later transition Nonspecific T wave abnormalities, lateral leads   CV: n/a  Past Medical History:  Diagnosis Date   Alzheimer's dementia (HCC)    per records from [facility]   Anemia    Anxiety    Arthritis    ra   Constipation    COPD (chronic obstructive pulmonary disease) (HCC)    Dementia (HCC)    Dementia (HCC)    Depression    Dyspnea 06/25/2013   History of COVID-19 09/2021   History of kidney stones    Hypertension    Hypokalemia    Leukocytosis    Leukocytosis, unspecified 06/25/2013   Leukocytosis, unspecified 06/25/2013   Psoriasis    Rheumatoid arthritis (HCC)    Unspecified vitamin D deficiency 06/25/2013   Vitamin B12 deficiency     Past Surgical History:  Procedure Laterality Date    APPENDECTOMY     CYSTOSCOPY W/ URETERAL STENT PLACEMENT Bilateral 07/10/2019   Procedure: CYSTOSCOPY WITH RETROGRADE PYELOGRAM AND BILATERAL URETERAL STENT PLACEMENT;  Surgeon: Crista Elliot, MD;  [facility];  Service: Urology;  Laterality: Bilateral;   CYSTOSCOPY W/ URETERAL STENT PLACEMENT Left 07/07/2022   Procedure: CYSTOSCOPY WITH RETROGRADE PYELOGRAM/URETERAL STENT PLACEMENT;  Surgeon: Jerilee Field, MD;  Location: [facility] OR;  Service: Urology;  Laterality: Left;   CYSTOSCOPY/URETEROSCOPY/HOLMIUM LASER/STENT PLACEMENT Bilateral 07/29/2019   Procedure: CYSTOSCOPY LEFT RETROGRADE PYELOGRAM LEFT URETEROSCOPY HOLMIUM LASER LEFT STENT PLACEMENT POSSIBLE RIGHT URETEROSCOPY LASER LITHOTRIPSY AND STENT;  Surgeon: Crista Elliot, MD;  [facility];  Service: Urology;  Laterality: Bilateral;   CYSTOSCOPY/URETEROSCOPY/HOLMIUM LASER/STENT PLACEMENT Left 09/16/2022   Procedure: CYSTOSCOPY LEFT URETEROSCOPY/STENT PLACEMENT;  Surgeon: Crista Elliot, MD;  [facility];  Service: Urology;  Laterality: Left;  1 HR FOR CASE   IR URETERAL STENT RIGHT NEW ACCESS W/O SEP NEPHROSTOMY CATH  09/18/2019   knee replacment Left    NEPHROLITHOTOMY Right 09/18/2019   Procedure: NEPHROLITHOTOMY PERCUTANEOUS;  Surgeon: Crista Elliot, MD;  [facility];  Service: Urology;  Laterality: Right;   TOE SURGERY      MEDICATIONS: No current facility-administered medications for this encounter.    acetaminophen (TYLENOL) 325 MG tablet   amLODipine (NORVASC) 5 MG tablet   folic acid (FOLVITE) 1 MG tablet   methotrexate (RHEUMATREX) 2.5 MG tablet  Nutritional Supplements (NUTRITIONAL DRINK PLUS PO)   Marcille Blanco [facility] Short Stay/Anesthesiology [facility] Phone [phone] 01/06/2023 5:05 PM

## 2023-01-06 NOTE — Progress Notes (Signed)
For Anesthesia: PCP - Dr. Alwyn Pea, Terri Piedra NP Cardiologist - N/A  Chest x-ray - 07/06/22 in [facility] EKG - 07/07/22 in [facility] Stress Test - N/A ECHO - N/A Cardiac Cath - N/A Pacemaker/ICD device last checked:N/A Pacemaker orders received:N/A Device Rep notified:N/A  Spinal Cord Stimulator: N/A  Sleep Study - N/A CPAP -   Fasting Blood Sugar - N/A Checks Blood Sugar __N/A___ times a day Date and result of last Hgb A1c-N/A  Last dose of GLP1 agonist- N/A GLP1 instructions: N/A  Last dose of SGLT-2 inhibitors- N/A SGLT-2 instructions:N/A  Blood Thinner Instructions: N/A Aspirin Instructions: N/A Last Dose: N/A  Activity level: Unable to go up a flight of stairs uses a wheelchair     Anesthesia review: HTN COPD  Patient denies shortness of breath, fever, cough and chest pain at PAT appointment   Patient verbalized understanding of instructions reviewed via telephone.

## 2023-01-06 NOTE — Patient Instructions (Addendum)
Preop instructions for:   Danielle Dixon   Date of Birth:  Jun 05, 1941                     Date of Procedure:   Monday, January 09, 2023 Procedure:  CYSTOSCOPY LEFT URETEROSCOPY/HOLMIUM LASER/STENT PLACEMENT      Surgeon: Rutherford Nail D  Facility contact: [facility]    Phone:  [phone]                RN contact name/phone#:  Zenett                        and Fax #: [phone] Health Care POA:   Transportation contact phone#: Marylene Land Transportation driver [phone]    Please send day of procedure:  Current med list  Medications taken the day of procedure (return attached form to hospital) confirm time of nothing by mouth status (return attached form to hospital) Patient Demographic info( to include DNR status, problem list, allergies) Bring Insurance card and picture ID    Time to arrive at [facility]: 7:15 AM   Report to: Admitting (On your left hand side)    Do not eat solid food past midnight the night before your procedure.(To include any tube feedings-must be discontinued)  May have the following until 7:15 AM day of procedure  CLEAR LIQUID DIET  Water Black Coffee (sugar ok, NO MILK/CREAM OR CREAMERS)  Tea (sugar ok, NO MILK/CREAM OR CREAMERS) regular and decaf                             Plain Jell-O (NO RED)                                           Fruit ices (not with fruit pulp, NO RED)                                     Popsicles (NO RED)                                                                  Juice: apple, WHITE grape, WHITE cranberry Sports drinks like Gatorade (NO RED)   Take these morning medications only with sips of water.(or give through gastrostomy or feeding tube).  Amlodipine   Note: No Insulin or Diabetic meds should be given or taken the morning of the procedure!  Oral Hygiene is also important to reduce your risk of infection.                                    Remember - BRUSH YOUR TEETH THE  MORNING OF SURGERY WITH YOUR REGULAR TOOTHPASTE   DENTURES WILL BE REMOVED PRIOR TO SURGERY PLEASE DO NOT APPLY "Poly grip" OR ADHESIVES!!!  Leave all jewelry and other valuables at place where living( no metal or rings to be worn) No contact lens Women-no  make-up, no lotions,perfumes,powders   Any questions day of procedure,call  SHORT STAY-[phone]     Sent from :[facility]                   Phone:[phone]                   [phone]   Sent by : Harleigh Civello BSN, RN

## 2023-01-06 NOTE — Progress Notes (Signed)
I reviewed pre op instruction via telephone with Zenett nurse at [facility]. She verbalized understanding.  I also faxed a copy to [employer] as well. Zenett will call either pre surgical testing department or short stay department to update the new POA contact information.

## 2023-01-09 ENCOUNTER — Ambulatory Visit (HOSPITAL_COMMUNITY): Payer: Medicare Other

## 2023-01-09 ENCOUNTER — Ambulatory Visit (HOSPITAL_BASED_OUTPATIENT_CLINIC_OR_DEPARTMENT_OTHER): Payer: Medicare Other | Admitting: Medical

## 2023-01-09 ENCOUNTER — Encounter (HOSPITAL_COMMUNITY)
Admission: RE | Disposition: A | Discharge status: Skilled Nursing Facility | Payer: Self-pay | Source: Home / Self Care | Attending: Urology

## 2023-01-09 ENCOUNTER — Encounter (HOSPITAL_COMMUNITY): Payer: Self-pay | Admitting: Urology

## 2023-01-09 ENCOUNTER — Other Ambulatory Visit: Payer: Self-pay

## 2023-01-09 ENCOUNTER — Ambulatory Visit (HOSPITAL_COMMUNITY)
Admission: RE | Admit: 2023-01-09 | Discharge: 2023-01-09 | Disposition: A | Discharge status: Skilled Nursing Facility | Payer: Medicare Other | Attending: Urology | Admitting: Urology

## 2023-01-09 ENCOUNTER — Ambulatory Visit (HOSPITAL_COMMUNITY): Payer: Medicare Other | Admitting: Medical

## 2023-01-09 DIAGNOSIS — Z79899 Other long term (current) drug therapy: Secondary | ICD-10-CM | POA: Insufficient documentation

## 2023-01-09 DIAGNOSIS — R911 Solitary pulmonary nodule: Secondary | ICD-10-CM | POA: Diagnosis not present

## 2023-01-09 DIAGNOSIS — L409 Psoriasis, unspecified: Secondary | ICD-10-CM | POA: Insufficient documentation

## 2023-01-09 DIAGNOSIS — D649 Anemia, unspecified: Secondary | ICD-10-CM

## 2023-01-09 DIAGNOSIS — Z6832 Body mass index (BMI) 32.0-32.9, adult: Secondary | ICD-10-CM | POA: Insufficient documentation

## 2023-01-09 DIAGNOSIS — N3020 Other chronic cystitis without hematuria: Secondary | ICD-10-CM | POA: Diagnosis not present

## 2023-01-09 DIAGNOSIS — N132 Hydronephrosis with renal and ureteral calculous obstruction: Secondary | ICD-10-CM | POA: Diagnosis not present

## 2023-01-09 DIAGNOSIS — F0280 Dementia in other diseases classified elsewhere without behavioral disturbance: Secondary | ICD-10-CM | POA: Diagnosis not present

## 2023-01-09 DIAGNOSIS — G309 Alzheimer's disease, unspecified: Secondary | ICD-10-CM | POA: Insufficient documentation

## 2023-01-09 DIAGNOSIS — F419 Anxiety disorder, unspecified: Secondary | ICD-10-CM | POA: Insufficient documentation

## 2023-01-09 DIAGNOSIS — E669 Obesity, unspecified: Secondary | ICD-10-CM | POA: Diagnosis not present

## 2023-01-09 DIAGNOSIS — M069 Rheumatoid arthritis, unspecified: Secondary | ICD-10-CM | POA: Insufficient documentation

## 2023-01-09 DIAGNOSIS — J449 Chronic obstructive pulmonary disease, unspecified: Secondary | ICD-10-CM | POA: Diagnosis not present

## 2023-01-09 DIAGNOSIS — F32A Depression, unspecified: Secondary | ICD-10-CM | POA: Insufficient documentation

## 2023-01-09 DIAGNOSIS — N202 Calculus of kidney with calculus of ureter: Secondary | ICD-10-CM | POA: Diagnosis not present

## 2023-01-09 DIAGNOSIS — I10 Essential (primary) hypertension: Secondary | ICD-10-CM | POA: Insufficient documentation

## 2023-01-09 DIAGNOSIS — M1990 Unspecified osteoarthritis, unspecified site: Secondary | ICD-10-CM | POA: Insufficient documentation

## 2023-01-09 DIAGNOSIS — N201 Calculus of ureter: Secondary | ICD-10-CM | POA: Diagnosis not present

## 2023-01-09 HISTORY — DX: Personal history of urinary calculi: Z87.442

## 2023-01-09 HISTORY — PX: CYSTOSCOPY/URETEROSCOPY/HOLMIUM LASER/STENT PLACEMENT: SHX6546

## 2023-01-09 LAB — BASIC METABOLIC PANEL
Anion gap: 10 (ref 5–15)
BUN: 11 mg/dL (ref 8–23)
CO2: 26 mmol/L (ref 22–32)
Calcium: 9.5 mg/dL (ref 8.9–10.3)
Chloride: 105 mmol/L (ref 98–111)
Creatinine, Ser: 0.89 mg/dL (ref 0.44–1.00)
GFR, Estimated: >60 mL/min (ref >60)
Glucose, Bld: 114 mg/dL — ABNORMAL HIGH (ref 70–99)
Potassium: 3.8 mmol/L (ref 3.5–5.1)
Sodium: 141 mmol/L (ref 135–145)

## 2023-01-09 LAB — CBC
HCT: 43.5 % (ref 36.0–46.0)
Hemoglobin: 13.8 g/dL (ref 12.0–15.0)
MCH: 30.5 pg (ref 26.0–34.0)
MCHC: 31.7 g/dL (ref 30.0–36.0)
MCV: 96.2 fL (ref 80.0–100.0)
Platelets: 378 10*3/uL (ref 150–400)
RBC: 4.52 MIL/uL (ref 3.87–5.11)
RDW: 14.7 % (ref 11.5–15.5)
WBC: 12.7 10*3/uL — ABNORMAL HIGH (ref 4.0–10.5)
nRBC: 0 % (ref 0.0–0.2)

## 2023-01-09 SURGERY — CYSTOSCOPY/URETEROSCOPY/HOLMIUM LASER/STENT PLACEMENT
Anesthesia: General | Laterality: Left

## 2023-01-09 MED ORDER — DEXMEDETOMIDINE HCL IN NACL 80 MCG/20ML IV SOLN
INTRAVENOUS | Status: DC | PRN
  Administered 2023-01-09: 8 ug via INTRAVENOUS

## 2023-01-09 MED ORDER — FENTANYL CITRATE (PF) 100 MCG/2ML IJ SOLN
INTRAMUSCULAR | Status: AC
  Filled 2023-01-09: qty 2

## 2023-01-09 MED ORDER — DEXMEDETOMIDINE HCL IN NACL 80 MCG/20ML IV SOLN
INTRAVENOUS | Status: AC
  Filled 2023-01-09: qty 20

## 2023-01-09 MED ORDER — LACTATED RINGERS IV SOLN: INTRAVENOUS | Status: DC

## 2023-01-09 MED ORDER — CHLORHEXIDINE GLUCONATE 0.12 % MT SOLN
15.0000 mL | Freq: Once | OROMUCOSAL | Status: AC
  Administered 2023-01-09: 15 mL via OROMUCOSAL

## 2023-01-09 MED ORDER — OXYCODONE HCL 5 MG/5ML PO SOLN: 5.0000 mg | Freq: Once | ORAL | Status: DC | PRN

## 2023-01-09 MED ORDER — ONDANSETRON HCL 4 MG/2ML IJ SOLN
INTRAMUSCULAR | Status: AC
  Filled 2023-01-09: qty 2

## 2023-01-09 MED ORDER — FENTANYL CITRATE PF 50 MCG/ML IJ SOSY: 25.0000 ug | PREFILLED_SYRINGE | INTRAMUSCULAR | Status: DC | PRN

## 2023-01-09 MED ORDER — SODIUM CHLORIDE 0.9 % IR SOLN
Status: DC | PRN
  Administered 2023-01-09: 3000 mL

## 2023-01-09 MED ORDER — ACETAMINOPHEN 500 MG PO TABS
1000.0000 mg | ORAL_TABLET | Freq: Once | ORAL | Status: AC
  Administered 2023-01-09: 1000 mg via ORAL

## 2023-01-09 MED ORDER — PROPOFOL 10 MG/ML IV BOLUS
INTRAVENOUS | Status: DC | PRN
  Administered 2023-01-09: 150 mg via INTRAVENOUS

## 2023-01-09 MED ORDER — DEXAMETHASONE SODIUM PHOSPHATE 10 MG/ML IJ SOLN
INTRAMUSCULAR | Status: DC | PRN
  Administered 2023-01-09: 5 mg via INTRAVENOUS

## 2023-01-09 MED ORDER — LIDOCAINE HCL (PF) 2 % IJ SOLN
INTRAMUSCULAR | Status: AC
  Filled 2023-01-09: qty 5

## 2023-01-09 MED ORDER — LIDOCAINE 2% (20 MG/ML) 5 ML SYRINGE
INTRAMUSCULAR | Status: DC | PRN
  Administered 2023-01-09: 60 mg via INTRAVENOUS

## 2023-01-09 MED ORDER — OXYCODONE HCL 5 MG PO TABS: 5.0000 mg | ORAL_TABLET | Freq: Once | ORAL | Status: DC | PRN

## 2023-01-09 MED ORDER — EPHEDRINE SULFATE-NACL 50-0.9 MG/10ML-% IV SOSY
PREFILLED_SYRINGE | INTRAVENOUS | Status: DC | PRN
  Administered 2023-01-09: 5 mg via INTRAVENOUS

## 2023-01-09 MED ORDER — FENTANYL CITRATE (PF) 100 MCG/2ML IJ SOLN
INTRAMUSCULAR | Status: DC | PRN
  Administered 2023-01-09: 50 ug via INTRAVENOUS

## 2023-01-09 MED ORDER — CIPROFLOXACIN IN D5W 400 MG/200ML IV SOLN
400.0000 mg | INTRAVENOUS | Status: AC
  Administered 2023-01-09: 400 mg via INTRAVENOUS
  Filled 2023-01-09: qty 200

## 2023-01-09 MED ORDER — AMOXICILLIN-POT CLAVULANATE 875-125 MG PO TABS: 1.0000 | ORAL_TABLET | Freq: Two times a day (BID) | ORAL | 0 refills | Status: DC

## 2023-01-09 MED ORDER — DEXAMETHASONE SODIUM PHOSPHATE 10 MG/ML IJ SOLN
INTRAMUSCULAR | Status: AC
  Filled 2023-01-09: qty 1

## 2023-01-09 MED ORDER — ONDANSETRON HCL 4 MG/2ML IJ SOLN: 4.0000 mg | Freq: Once | INTRAMUSCULAR | Status: DC | PRN

## 2023-01-09 MED ORDER — IOHEXOL 300 MG/ML  SOLN
INTRAMUSCULAR | Status: DC | PRN
  Administered 2023-01-09: 20 mL

## 2023-01-09 MED ORDER — EPHEDRINE 5 MG/ML INJ
INTRAVENOUS | Status: AC
  Filled 2023-01-09: qty 5

## 2023-01-09 MED ORDER — ACETAMINOPHEN 500 MG PO TABS
ORAL_TABLET | ORAL | Status: AC
  Filled 2023-01-09: qty 2

## 2023-01-09 MED ORDER — ONDANSETRON HCL 4 MG/2ML IJ SOLN
INTRAMUSCULAR | Status: DC | PRN
  Administered 2023-01-09: 4 mg via INTRAVENOUS

## 2023-01-09 MED ORDER — ORAL CARE MOUTH RINSE: 15.0000 mL | Freq: Once | OROMUCOSAL | Status: AC

## 2023-01-09 SURGICAL SUPPLY — 25 items
BAG URO CATCHER STRL LF (MISCELLANEOUS) ×1 IMPLANT
BASKET LASER NITINOL 1.9FR (BASKET) IMPLANT
BASKET ZERO TIP NITINOL 2.4FR (BASKET) IMPLANT
BSKT STON RTRVL 120 1.9FR (BASKET)
BSKT STON RTRVL ZERO TP 2.4FR (BASKET) ×1
CATH URETERAL DUAL LUMEN 10F (MISCELLANEOUS) IMPLANT
CATH URETL OPEN END 6FR 70 (CATHETERS) ×1 IMPLANT
CLOTH BEACON ORANGE TIMEOUT ST (SAFETY) ×1 IMPLANT
EXTRACTOR STONE 1.7FRX115CM (UROLOGICAL SUPPLIES) IMPLANT
GLOVE BIO SURGEON STRL SZ7.5 (GLOVE) ×1 IMPLANT
GOWN STRL REUS W/ TWL XL LVL3 (GOWN DISPOSABLE) ×1 IMPLANT
GOWN STRL REUS W/TWL XL LVL3 (GOWN DISPOSABLE) ×1
GUIDEWIRE ANG ZIPWIRE 038X150 (WIRE) IMPLANT
GUIDEWIRE STR DUAL SENSOR (WIRE) ×1 IMPLANT
KIT TURNOVER KIT A (KITS) IMPLANT
LASER FIB FLEXIVA PULSE ID 365 (Laser) IMPLANT
MANIFOLD NEPTUNE II (INSTRUMENTS) ×1 IMPLANT
PACK CYSTO (CUSTOM PROCEDURE TRAY) ×1 IMPLANT
SHEATH NAVIGATOR HD 11/13X28 (SHEATH) IMPLANT
SHEATH NAVIGATOR HD 11/13X36 (SHEATH) IMPLANT
STENT URET 6FRX24 CONTOUR (STENTS) IMPLANT
TRACTIP FLEXIVA PULS ID 200XHI (Laser) IMPLANT
TRACTIP FLEXIVA PULSE ID 200 (Laser)
TUBING CONNECTING 10 (TUBING) ×1 IMPLANT
TUBING UROLOGY SET (TUBING) ×1 IMPLANT

## 2023-01-09 NOTE — H&P (Signed)
CC/HPI: 1.20.21: This patient is status post left ureteroscopy with laser lithotripsy and ureteral stent placement followed by right PCNL. She presents for bilateral stent removal    2.17.21: Patient with above noted history. She underwent bilateral ureteral stent removal at prior OV. Overall, she is a poor historian and is only accompanied by transporter from her facility who is no involved in her care. Today she states that she has not had any significant problems since being seen last. In particular she denies any current or interval flank or abdominal pain. She denies dysuria, voiding difficulties, or exacerbation of voiding symptoms. No complaints of gross hematuria, fevers, chills, nausea, or vomiting. She denies interval treatment for UTI.   02/28/2020  In the interval, the patient has done very well. No flank pain, no hematuria. No reported urinary tract infections.   09/23/22: She presents today for a left sided stent removal after undergoing left sided ureteroscopy one week ago. She deines fevers and chills.   11/11/2022  Patient underwent a renal ultrasound today. This shows possible left-sided hydronephrosis. She had multiple cysts. Review of prior CT shows some possible parapelvic cyst. She denies any flank pain or hematuria.   12/29/2022  Patient underwent CT hematuria protocol. This revealed confirmation of the left-sided hydronephrosis. Potential small ureteral calculus in the distal ureter. She also had diffuse urothelial thickening. This was noted on prior ureteroscopy and did not appear to be malignancy, more inflammatory in nature. We did not see any stones on ureteroscopy last time but see a calcification in the distal ureter on CT. she has been scheduled for ureteroscopy on 4/29. However, she was also found to have a mass in the lung 1.9 cm concern for bronchogenic carcinoma.     ALLERGIES: No Allergies    MEDICATIONS: Simvastatin 10 mg tablet  Acetaminophen 325 mg  tablet,chewable  Albuterol Sulfate Hfa 90 mcg hfa aerosol with adapter  Alprazolam 0.25 mg tablet  Amlodipine Besylate 5 mg tablet  Aspirin Ec 81 mg tablet, delayed release  Atenolol 25 mg tablet  Clobetasol Propionate 0.05 % cream  Donepezil Hcl 10 mg tablet  Folic Acid 1 mg tablet  Hydrocodone-Acetaminophen 5 mg-325 mg tablet  Methotrexate 2.5 mg tablet  Miralax 17 gram/dose powder  Paroxetine Er 25 mg tablet, extended release 24 hr  Robafen  Triamcinolone Acetonide 0.025 % cream  Vitamin B12  Zafirlukast 20 mg tablet     GU PSH: Cysto Remove Stent FB Sim - 09/23/2022, 2021 Cystoscopy Insert Stent, Left - 09/16/2022, Bilateral - 2020 Cystoscopy Ureteroscopy, Left - 09/16/2022 Locm 300-399Mg /Ml Iodine,1Ml - 11/24/2022 Percut Stone Removal >2cm, Right - 2021 Ureteroscopic laser litho, Left - 2020     NON-GU PSH: Visit Complexity (formerly GPC1X) - 11/30/2022, 11/11/2022     GU PMH: Hydronephrosis - 11/30/2022, - 11/24/2022, - 11/11/2022 Ureteral calculus - 11/30/2022 Ureteral obstruction secondary to calculous - 11/30/2022 Renal and ureteral calculus - 11/11/2022, - 09/23/2022, - 2021 Renal calculus - 2021    NON-GU PMH: Alzheimer's disease, unspecified Anxiety COPD Depression Hypercholesterolemia Hypertension Rheumatoid Arthritis Vitamin B12 deficiency anemia, unspecified    FAMILY HISTORY: No Family History    SOCIAL HISTORY: Marital Status: Divorced Preferred Language: English; Ethnicity: Not Hispanic Or Latino; Race: White    REVIEW OF SYSTEMS:    GU Review Female:   Patient denies frequent urination, hard to postpone urination, burning /pain with urination, get up at night to urinate, leakage of urine, stream starts and stops, trouble starting your stream, have to strain to  urinate, and being pregnant.  Gastrointestinal (Upper):   Patient denies nausea, vomiting, and indigestion/ heartburn.  Gastrointestinal (Lower):   Patient denies diarrhea and constipation.   Constitutional:   Patient denies fever, night sweats, weight loss, and fatigue.  Skin:   Patient denies skin rash/ lesion and itching.  Eyes:   Patient denies blurred vision and double vision.  Ears/ Nose/ Throat:   Patient denies sore throat and sinus problems.  Hematologic/Lymphatic:   Patient denies swollen glands and easy bruising.  Cardiovascular:   Patient denies leg swelling and chest pains.  Respiratory:   Patient denies cough and shortness of breath.  Endocrine:   Patient denies excessive thirst.  Musculoskeletal:   Patient denies back pain and joint pain.  Neurological:   Patient denies dizziness and headaches.  Psychologic:   Patient denies depression and anxiety.   VITAL SIGNS: None   Complexity of Data:  Source Of History:  Patient  Records Review:   Previous Doctor Records, Previous Patient Records   PROCEDURES:          Visit Complexity - G2211    ASSESSMENT:      ICD-10 Details  1 GU:   Hydronephrosis - N13.0 Chronic, Stable  2   Renal and ureteral calculus - N20.2 Chronic, Stable  3   Ureteral obstruction secondary to calculous - N13.2 Chronic, Stable     PLAN:           Schedule Return Visit/Planned Activity: ASAP - Consult             Note: [facility], lung mass concerning for bronchogenic carcinoma          Document Letter(s):  Created for Patient: Clinical Summary         Notes:   For lung finding, I will get her referred ASAP to [facility] pulmonology.   She will proceed as scheduled for her ureteroscopy for the hydronephrosis/calculus.        Next Appointment:      Next Appointment: 01/09/2023 11:00 AM    Appointment Type: Surgery     Location: [facility] [phone]    Provider: Modena Slater, III, M.D.    Reason for Visit: OP [facility] CYSTO LT URS LL STENT      Signed by Modena Slater, III, M.D. on 12/29/22 at 10:35 AM (EDT

## 2023-01-09 NOTE — OR Nursing (Signed)
Called [employer] and gave report, then called Minerva Areola nephew to pick up prescription at [facility] since prescription was not given to nursing facility.

## 2023-01-09 NOTE — Anesthesia Postprocedure Evaluation (Signed)
Anesthesia Post Note  Patient: Danielle Dixon  Procedure(s) Performed: CYSTOSCOPY LEFT URETEROSCOPYSTONE BASKETERY/STENT PLACEMENT (Left)     Patient location during evaluation: PACU Anesthesia Type: General Level of consciousness: awake and alert, oriented and patient cooperative Pain management: pain level controlled Vital Signs Assessment: post-procedure vital signs reviewed and stable Respiratory status: spontaneous breathing, nonlabored ventilation and respiratory function stable Cardiovascular status: blood pressure returned to baseline and stable Postop Assessment: no apparent nausea or vomiting Anesthetic complications: no   No notable events documented.  Last Vitals:  Vitals:   01/09/23 1115 01/09/23 1120  BP: 137/68   Pulse: 69 68  Resp: 17 16  Temp:  36.7 C  SpO2: 98% 96%    Last Pain:  Vitals:   01/09/23 1120  TempSrc:   PainSc: 0-No pain                 Lannie Fields

## 2023-01-09 NOTE — Transfer of Care (Signed)
Immediate Anesthesia Transfer of Care Note  Patient: Kendria Halberg  Procedure(s) Performed: CYSTOSCOPY LEFT URETEROSCOPYSTONE BASKETERY/STENT PLACEMENT (Left)  Patient Location: PACU  Anesthesia Type:General  Level of Consciousness: sedated  Airway & Oxygen Therapy: Patient Spontanous Breathing and Patient connected to face mask oxygen  Post-op Assessment: Report given to RN and Post -op Vital signs reviewed and stable  Post vital signs: Reviewed and stable  Last Vitals:  Vitals Value Taken Time  BP 136/68 01/09/23 1052  Temp    Pulse 72 01/09/23 1052  Resp 17 01/09/23 1052  SpO2 100 % 01/09/23 1052  Vitals shown include unvalidated device data.  Last Pain:  Vitals:   01/09/23 0748  TempSrc:   PainSc: 0-No pain         Complications: No notable events documented.

## 2023-01-09 NOTE — Op Note (Signed)
Operative Note  Preoperative diagnosis:  1.  Left ureteral calculus 2.  Chronic cystitis  Postoperative diagnosis: 1.  Same  Procedure(s): 1.  Cystoscopy with left retrograde pyelogram, left ureteroscopy with stone extraction, left ureteral stent placement  Surgeon: Modena Slater, MD  Assistants: None  Anesthesia: General  Complications: None immediate  EBL: Minimal  Specimens: 1.  Urine culture, ureteral calculus  Drains/Catheters: 1.  6 x 2 4 double-J ureteral stent  Intraoperative findings: 1.  Normal urethra and bladder mucosa except for signs of chronic cystitis.  Evidence of urinary stasis with murky urine.  She also had murky urine within the kidney/renal pelvis that was aspirated.  That was sent for culture.  She had a single small left distal ureteral calculus that was basket extracted.  No evidence of any tumors/masses or other stones.  Retrograde pyelogram revealed some mild hydronephrosis but no filling defect.  Indication: 82 year old female with a left distal ureteral calculus presents for the previously mentioned operation.  Description of procedure:  The patient was identified and consent was obtained.  The patient was taken to the operating room and placed in the supine position.  The patient was placed under general anesthesia.  Perioperative antibiotics were administered.  The patient was placed in dorsal lithotomy.  Patient was prepped and draped in a standard sterile fashion and a timeout was performed.  A 21 French rigid cystoscope was advanced into the urethra and into the bladder.  Complete cystoscopy was performed with findings noted above.  The left ureteral orifice was cannulated with a sensor wire which was advanced up to the kidney under fluoroscopic guidance.  Semirigid ureteroscopy was performed alongside the wire.  There was an area of narrowing in the distal ureter that was able to be transversed with the scope.  Just beyond this there was a small  ureteral calculus.  I was able to basket extract this without any trauma.  I readvanced the scope into the ureter and there was some matrix appearing within the ureter and kidney.  I aspirated the renal pelvis and sent that for culture.  I shot a retrograde pyelogram through the scope with findings noted above.  I withdrew the scope visualizing the ureter upon removal.  There was evidence of chronic edema/cystitis but no evidence of any tumors or masses.  There were no other ureteral calculi.  I backloaded the wire onto the rigid cystoscope and advanced that into the bladder followed by routine placement of a 6 x 24 double-J ureteral stent.  Fluoroscopy confirmed proximal placement and direct visualization confirmed a good coil within the bladder.  I drained the bladder and withdrew the scope.  Patient tolerated the procedure well and was stable postoperatively.  Plan: Follow-up in about 1 week or so for stent removal

## 2023-01-09 NOTE — Anesthesia Procedure Notes (Signed)
Procedure Name: LMA Insertion Date/Time: 01/09/2023 10:01 AM  Performed by: Doran Clay, CRNAPre-anesthesia Checklist: Patient identified, Emergency Drugs available, Suction available, Patient being monitored and Timeout performed Patient Re-evaluated:Patient Re-evaluated prior to induction Oxygen Delivery Method: Circle system utilized Preoxygenation: Pre-oxygenation with 100% oxygen Induction Type: IV induction LMA: LMA inserted LMA Size: 4.0 Tube type: Oral Number of attempts: 1 Placement Confirmation: positive ETCO2 and breath sounds checked- equal and bilateral Tube secured with: Tape Dental Injury: Teeth and Oropharynx as per pre-operative assessment

## 2023-01-09 NOTE — Discharge Instructions (Addendum)

## 2023-01-10 ENCOUNTER — Encounter (HOSPITAL_COMMUNITY): Payer: Self-pay | Admitting: Urology

## 2023-01-11 LAB — URINE CULTURE: Culture: >=100000 — AB

## 2023-01-17 DIAGNOSIS — N201 Calculus of ureter: Secondary | ICD-10-CM | POA: Diagnosis not present

## 2023-01-19 ENCOUNTER — Ambulatory Visit
Admission: RE | Admit: 2023-01-19 | Discharge: 2023-01-19 | Disposition: A | Discharge status: Home / Self Care | Payer: Medicare Other | Source: Ambulatory Visit | Attending: Student | Admitting: Student

## 2023-01-19 DIAGNOSIS — I700 Atherosclerosis of aorta: Secondary | ICD-10-CM | POA: Diagnosis not present

## 2023-01-19 DIAGNOSIS — R911 Solitary pulmonary nodule: Secondary | ICD-10-CM

## 2023-01-19 DIAGNOSIS — K8080 Other cholelithiasis without obstruction: Secondary | ICD-10-CM | POA: Diagnosis not present

## 2023-01-24 DIAGNOSIS — L409 Psoriasis, unspecified: Secondary | ICD-10-CM | POA: Diagnosis not present

## 2023-01-24 DIAGNOSIS — Z960 Presence of urogenital implants: Secondary | ICD-10-CM | POA: Diagnosis not present

## 2023-01-24 DIAGNOSIS — F0390 Unspecified dementia without behavioral disturbance: Secondary | ICD-10-CM | POA: Diagnosis not present

## 2023-01-24 DIAGNOSIS — I10 Essential (primary) hypertension: Secondary | ICD-10-CM | POA: Diagnosis not present

## 2023-01-24 DIAGNOSIS — N201 Calculus of ureter: Secondary | ICD-10-CM | POA: Diagnosis not present

## 2023-01-25 DIAGNOSIS — I10 Essential (primary) hypertension: Secondary | ICD-10-CM | POA: Diagnosis not present

## 2023-01-25 DIAGNOSIS — E559 Vitamin D deficiency, unspecified: Secondary | ICD-10-CM | POA: Diagnosis not present

## 2023-01-30 NOTE — Progress Notes (Unsigned)
Synopsis: Referred for pulmonary nodule by Shon Hale, *  Subjective:   PATIENT ID: Danielle Dixon GENDER: female DOB: 1941/07/29, MRN: [id number]  No chief complaint on file.  82yF with history of AlzD, COPD, HTN, RA referred for pulmonary nodule   She has no cough, substantial DOE, hemoptysis, unintentional weight loss.   Has upcoming cystoscopy/stent placement 01/09/23 with Dr. Alvester Morin. Tolerated general anesthesia 09/16/22 for cysto.     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   Interval HPI No suspicious nodule on CT super D  Otherwise pertinent review of systems is negative.  Past Medical History:  Diagnosis Date   Alzheimer's dementia (HCC)    per records from [facility]   Anemia    Anxiety    Arthritis    ra   Constipation    COPD (chronic obstructive pulmonary disease) (HCC)    Dementia (HCC)    Dementia (HCC)    Depression    Dyspnea 06/25/2013   History of COVID-19 09/2021   History of kidney stones    Hypertension    Hypokalemia    Leukocytosis    Leukocytosis, unspecified 06/25/2013   Leukocytosis, unspecified 06/25/2013   Psoriasis    Rheumatoid arthritis (HCC)    Unspecified vitamin D deficiency 06/25/2013   Vitamin B12 deficiency      Family History  Problem Relation Age of Onset   Cancer Mother        breast cancer   Cancer Father        lung     Past Surgical History:  Procedure Laterality Date   APPENDECTOMY     CYSTOSCOPY W/ URETERAL STENT PLACEMENT Bilateral 07/10/2019   Procedure: CYSTOSCOPY WITH RETROGRADE PYELOGRAM AND BILATERAL URETERAL STENT PLACEMENT;  Surgeon: Crista Elliot, MD;  Location: [facility];  Service: Urology;  Laterality: Bilateral;   CYSTOSCOPY W/ URETERAL STENT PLACEMENT Left 07/07/2022   Procedure: CYSTOSCOPY WITH RETROGRADE PYELOGRAM/URETERAL STENT PLACEMENT;  Surgeon: Jerilee Field, MD;  Location[facility];  Service: Urology;  Laterality: Left;   CYSTOSCOPY/URETEROSCOPY/HOLMIUM LASER/STENT PLACEMENT Bilateral 07/29/2019   Procedure: CYSTOSCOPY LEFT RETROGRADE PYELOGRAM LEFT URETEROSCOPY HOLMIUM LASER LEFT STENT PLACEMENT POSSIBLE RIGHT URETEROSCOPY LASER LITHOTRIPSY AND STENT;  Surgeon: Crista Elliot, MD;  Location: [facility];  Service: Urology;  Laterality: Bilateral;   CYSTOSCOPY/URETEROSCOPY/HOLMIUM LASER/STENT PLACEMENT Left 09/16/2022   Procedure: CYSTOSCOPY LEFT URETEROSCOPY/STENT PLACEMENT;  Surgeon: Crista Elliot, MD;  Location: [facility];  Service: Urology;  Laterality: Left;  1 HR FOR CASE   CYSTOSCOPY/URETEROSCOPY/HOLMIUM LASER/STENT PLACEMENT Left 01/09/2023   Procedure: CYSTOSCOPY LEFT URETEROSCOPYSTONE BASKETERY/STENT PLACEMENT;  Surgeon: Crista Elliot, MD;  Location: [facility];  Service: Urology;  Laterality: Left;  1 HR FOR CASE   IR URETERAL STENT RIGHT NEW ACCESS W/O SEP NEPHROSTOMY CATH  09/18/2019   knee replacment Left    NEPHROLITHOTOMY Right 09/18/2019   Procedure: NEPHROLITHOTOMY PERCUTANEOUS;  Surgeon: Crista Elliot, MD;  Location: [facility];  Service: Urology;  Laterality: Right;   TOE SURGERY      Social History   Socioeconomic History   Marital status: Divorced    Spouse name: Not on file   Number of children: Not on file   Years of education: Not on file   Highest education level: Not on file  Occupational History   Not on file  Tobacco  Use   Smoking status: Never    Passive exposure: Never   Smokeless tobacco: Never  Vaping Use   Vaping Use: Never used  Substance and Sexual Activity   Alcohol use: No   Drug use: No   Sexual activity: Not on file  Other Topics Concern   Not on file  Social History Narrative   Not on file   Social Determinants of Health   Financial Resource Strain: Not on file  Food Insecurity: Not on file  Transportation Needs: Not on file  Physical Activity: Not on file  Stress: Not on file   Social Connections: Not on file  Intimate Partner Violence: Not on file     No Known Allergies   Outpatient Medications Prior to Visit  Medication Sig Dispense Refill   acetaminophen (TYLENOL) 325 MG tablet Take 650 mg by mouth every 4 (four) hours as needed for mild pain (max 3g/24 hrs.).     amLODipine (NORVASC) 5 MG tablet Take 5 mg by mouth in the morning.     amoxicillin-clavulanate (AUGMENTIN) 875-125 MG tablet Take 1 tablet by mouth 2 (two) times daily. 14 tablet 0   folic acid (FOLVITE) 1 MG tablet Take 1 mg by mouth See admin instructions. TAKE 1 TABLET (1 MG) BY MOUTH MONDAYS, TUESDAYS, WEDNESDAYS, FRIDAYS, SATURDAYS & SUNDAYS.     methotrexate (RHEUMATREX) 2.5 MG tablet Take 15 mg by mouth every Thursday.     Nutritional Supplements (NUTRITIONAL DRINK PLUS PO) Take 120 mLs by mouth in the morning and at bedtime. Health Shake     No facility-administered medications prior to visit.       Objective:   Physical Exam:  General appearance: 82 y.o., female, NAD, conversant  Eyes: anicteric sclerae; PERRL, tracking appropriately HENT: NCAT; MMM Neck: Trachea midline; no lymphadenopathy, no JVD Lungs: CTAB, no crackles, no wheeze, with normal respiratory effort CV: RRR, no murmur  Abdomen: Soft, non-tender; non-distended, BS present  Extremities: No peripheral edema, warm Skin: Normal turgor and texture; no rash Psych: Appropriate affect Neuro: attends to conversation with simple questions, no focal deficit     There were no vitals filed for this visit.   on RA BMI Readings from Last 3 Encounters:  01/09/23 32.27 kg/m  01/03/23 32.28 kg/m  09/16/22 32.28 kg/m   Wt Readings from Last 3 Encounters:  01/09/23 199 lb 15.3 oz (90.7 kg)  09/16/22 200 lb (90.7 kg)  08/27/22 198 lb 6.6 oz (90 kg)     CBC    Component Value Date/Time   WBC 12.7 (H) 01/09/2023 0740   RBC 4.52 01/09/2023 0740   HGB 13.8 01/09/2023 0740   HGB 13.7 10/09/2013 1407   HCT 43.5  01/09/2023 0740   HCT 42.3 10/09/2013 1407   PLT 378 01/09/2023 0740   PLT 459 (H) 10/09/2013 1407   MCV 96.2 01/09/2023 0740   MCV 94.5 10/09/2013 1407   MCH 30.5 01/09/2023 0740   MCHC 31.7 01/09/2023 0740   RDW 14.7 01/09/2023 0740   RDW 17.4 (H) 10/09/2013 1407   LYMPHSABS 3.7 08/31/2022 0357   LYMPHSABS 2.7 10/09/2013 1407   MONOABS 0.7 08/31/2022 0357   MONOABS 0.5 10/09/2013 1407   EOSABS 0.6 (H) 08/31/2022 0357   EOSABS 0.3 10/09/2013 1407   BASOSABS 0.1 08/31/2022 0357   BASOSABS 0.0 10/09/2013 1407    Eos 400-600  Chest Imaging: CT A/P 12/04/22 with spiculated appearing nodule LLL incompletely imaged. Not seen on prior CT A/P however nodule is located  superior to prior imaging.   Pulmonary Functions Testing Results:     No data to display              Assessment & Plan:   # LLL pulmonary nodule  Plan: - CT super d chest in 2-3 weeks   Launa Flight, nephew is [place], [phone]  Omar Person, MD [facility] 01/30/2023 6:15 PM

## 2023-02-01 ENCOUNTER — Ambulatory Visit (INDEPENDENT_AMBULATORY_CARE_PROVIDER_SITE_OTHER): Payer: Medicare Other | Admitting: Student

## 2023-02-01 ENCOUNTER — Encounter: Payer: Self-pay | Admitting: Student

## 2023-02-01 VITALS — BP 122/68 | HR 82 | Temp 97.9°F

## 2023-02-01 DIAGNOSIS — R911 Solitary pulmonary nodule: Secondary | ICD-10-CM

## 2023-02-01 NOTE — Patient Instructions (Addendum)
-   happy to see you again if need be for cough, trouble breathing, or any concerning chest imaging

## 2023-02-03 DIAGNOSIS — L409 Psoriasis, unspecified: Secondary | ICD-10-CM | POA: Diagnosis not present

## 2023-02-08 DIAGNOSIS — F32A Depression, unspecified: Secondary | ICD-10-CM | POA: Diagnosis not present

## 2023-02-08 DIAGNOSIS — G309 Alzheimer's disease, unspecified: Secondary | ICD-10-CM | POA: Diagnosis not present

## 2023-02-08 DIAGNOSIS — I10 Essential (primary) hypertension: Secondary | ICD-10-CM | POA: Diagnosis not present

## 2023-02-08 DIAGNOSIS — J449 Chronic obstructive pulmonary disease, unspecified: Secondary | ICD-10-CM | POA: Diagnosis not present

## 2023-02-09 DIAGNOSIS — L400 Psoriasis vulgaris: Secondary | ICD-10-CM | POA: Diagnosis not present

## 2023-02-16 DIAGNOSIS — J449 Chronic obstructive pulmonary disease, unspecified: Secondary | ICD-10-CM | POA: Diagnosis not present

## 2023-02-16 DIAGNOSIS — I10 Essential (primary) hypertension: Secondary | ICD-10-CM | POA: Diagnosis not present

## 2023-02-21 DIAGNOSIS — F0390 Unspecified dementia without behavioral disturbance: Secondary | ICD-10-CM | POA: Diagnosis not present

## 2023-02-21 DIAGNOSIS — F32A Depression, unspecified: Secondary | ICD-10-CM | POA: Diagnosis not present

## 2023-02-22 DIAGNOSIS — N3020 Other chronic cystitis without hematuria: Secondary | ICD-10-CM | POA: Diagnosis not present

## 2023-02-22 DIAGNOSIS — N202 Calculus of kidney with calculus of ureter: Secondary | ICD-10-CM | POA: Diagnosis not present

## 2023-02-27 DIAGNOSIS — M6281 Muscle weakness (generalized): Secondary | ICD-10-CM | POA: Diagnosis not present

## 2023-02-27 DIAGNOSIS — G309 Alzheimer's disease, unspecified: Secondary | ICD-10-CM | POA: Diagnosis not present

## 2023-02-27 DIAGNOSIS — R52 Pain, unspecified: Secondary | ICD-10-CM | POA: Diagnosis not present

## 2023-02-28 DIAGNOSIS — R52 Pain, unspecified: Secondary | ICD-10-CM | POA: Diagnosis not present

## 2023-02-28 DIAGNOSIS — M6281 Muscle weakness (generalized): Secondary | ICD-10-CM | POA: Diagnosis not present

## 2023-02-28 DIAGNOSIS — G309 Alzheimer's disease, unspecified: Secondary | ICD-10-CM | POA: Diagnosis not present

## 2023-03-01 DIAGNOSIS — M6281 Muscle weakness (generalized): Secondary | ICD-10-CM | POA: Diagnosis not present

## 2023-03-01 DIAGNOSIS — R52 Pain, unspecified: Secondary | ICD-10-CM | POA: Diagnosis not present

## 2023-03-01 DIAGNOSIS — G309 Alzheimer's disease, unspecified: Secondary | ICD-10-CM | POA: Diagnosis not present

## 2023-03-02 DIAGNOSIS — R52 Pain, unspecified: Secondary | ICD-10-CM | POA: Diagnosis not present

## 2023-03-02 DIAGNOSIS — M6281 Muscle weakness (generalized): Secondary | ICD-10-CM | POA: Diagnosis not present

## 2023-03-02 DIAGNOSIS — G309 Alzheimer's disease, unspecified: Secondary | ICD-10-CM | POA: Diagnosis not present

## 2023-03-03 DIAGNOSIS — M6281 Muscle weakness (generalized): Secondary | ICD-10-CM | POA: Diagnosis not present

## 2023-03-03 DIAGNOSIS — R52 Pain, unspecified: Secondary | ICD-10-CM | POA: Diagnosis not present

## 2023-03-03 DIAGNOSIS — G309 Alzheimer's disease, unspecified: Secondary | ICD-10-CM | POA: Diagnosis not present

## 2023-03-06 DIAGNOSIS — M6281 Muscle weakness (generalized): Secondary | ICD-10-CM | POA: Diagnosis not present

## 2023-03-06 DIAGNOSIS — R52 Pain, unspecified: Secondary | ICD-10-CM | POA: Diagnosis not present

## 2023-03-06 DIAGNOSIS — G309 Alzheimer's disease, unspecified: Secondary | ICD-10-CM | POA: Diagnosis not present

## 2023-03-07 DIAGNOSIS — M6281 Muscle weakness (generalized): Secondary | ICD-10-CM | POA: Diagnosis not present

## 2023-03-07 DIAGNOSIS — G309 Alzheimer's disease, unspecified: Secondary | ICD-10-CM | POA: Diagnosis not present

## 2023-03-07 DIAGNOSIS — R52 Pain, unspecified: Secondary | ICD-10-CM | POA: Diagnosis not present

## 2023-03-08 DIAGNOSIS — G309 Alzheimer's disease, unspecified: Secondary | ICD-10-CM | POA: Diagnosis not present

## 2023-03-08 DIAGNOSIS — M6281 Muscle weakness (generalized): Secondary | ICD-10-CM | POA: Diagnosis not present

## 2023-03-08 DIAGNOSIS — R52 Pain, unspecified: Secondary | ICD-10-CM | POA: Diagnosis not present

## 2023-03-09 DIAGNOSIS — G309 Alzheimer's disease, unspecified: Secondary | ICD-10-CM | POA: Diagnosis not present

## 2023-03-09 DIAGNOSIS — M6281 Muscle weakness (generalized): Secondary | ICD-10-CM | POA: Diagnosis not present

## 2023-03-09 DIAGNOSIS — R52 Pain, unspecified: Secondary | ICD-10-CM | POA: Diagnosis not present

## 2023-03-10 DIAGNOSIS — G309 Alzheimer's disease, unspecified: Secondary | ICD-10-CM | POA: Diagnosis not present

## 2023-03-10 DIAGNOSIS — R52 Pain, unspecified: Secondary | ICD-10-CM | POA: Diagnosis not present

## 2023-03-10 DIAGNOSIS — M6281 Muscle weakness (generalized): Secondary | ICD-10-CM | POA: Diagnosis not present

## 2023-03-13 DIAGNOSIS — G309 Alzheimer's disease, unspecified: Secondary | ICD-10-CM | POA: Diagnosis not present

## 2023-03-13 DIAGNOSIS — R52 Pain, unspecified: Secondary | ICD-10-CM | POA: Diagnosis not present

## 2023-03-13 DIAGNOSIS — M6281 Muscle weakness (generalized): Secondary | ICD-10-CM | POA: Diagnosis not present

## 2023-03-14 DIAGNOSIS — R52 Pain, unspecified: Secondary | ICD-10-CM | POA: Diagnosis not present

## 2023-03-14 DIAGNOSIS — G309 Alzheimer's disease, unspecified: Secondary | ICD-10-CM | POA: Diagnosis not present

## 2023-03-14 DIAGNOSIS — M6281 Muscle weakness (generalized): Secondary | ICD-10-CM | POA: Diagnosis not present

## 2023-03-15 DIAGNOSIS — M6281 Muscle weakness (generalized): Secondary | ICD-10-CM | POA: Diagnosis not present

## 2023-03-15 DIAGNOSIS — R52 Pain, unspecified: Secondary | ICD-10-CM | POA: Diagnosis not present

## 2023-03-15 DIAGNOSIS — G309 Alzheimer's disease, unspecified: Secondary | ICD-10-CM | POA: Diagnosis not present

## 2023-03-15 DIAGNOSIS — F32A Depression, unspecified: Secondary | ICD-10-CM | POA: Diagnosis not present

## 2023-03-16 DIAGNOSIS — R52 Pain, unspecified: Secondary | ICD-10-CM | POA: Diagnosis not present

## 2023-03-16 DIAGNOSIS — G309 Alzheimer's disease, unspecified: Secondary | ICD-10-CM | POA: Diagnosis not present

## 2023-03-16 DIAGNOSIS — M6281 Muscle weakness (generalized): Secondary | ICD-10-CM | POA: Diagnosis not present

## 2023-03-17 DIAGNOSIS — M6281 Muscle weakness (generalized): Secondary | ICD-10-CM | POA: Diagnosis not present

## 2023-03-17 DIAGNOSIS — G309 Alzheimer's disease, unspecified: Secondary | ICD-10-CM | POA: Diagnosis not present

## 2023-03-17 DIAGNOSIS — F0390 Unspecified dementia without behavioral disturbance: Secondary | ICD-10-CM | POA: Diagnosis not present

## 2023-03-17 DIAGNOSIS — R3981 Functional urinary incontinence: Secondary | ICD-10-CM | POA: Diagnosis not present

## 2023-03-17 DIAGNOSIS — M1710 Unilateral primary osteoarthritis, unspecified knee: Secondary | ICD-10-CM | POA: Diagnosis not present

## 2023-03-17 DIAGNOSIS — J449 Chronic obstructive pulmonary disease, unspecified: Secondary | ICD-10-CM | POA: Diagnosis not present

## 2023-03-17 DIAGNOSIS — Z7409 Other reduced mobility: Secondary | ICD-10-CM | POA: Diagnosis not present

## 2023-03-17 DIAGNOSIS — I10 Essential (primary) hypertension: Secondary | ICD-10-CM | POA: Diagnosis not present

## 2023-03-17 DIAGNOSIS — R52 Pain, unspecified: Secondary | ICD-10-CM | POA: Diagnosis not present

## 2023-03-20 DIAGNOSIS — J449 Chronic obstructive pulmonary disease, unspecified: Secondary | ICD-10-CM | POA: Diagnosis not present

## 2023-03-20 DIAGNOSIS — G309 Alzheimer's disease, unspecified: Secondary | ICD-10-CM | POA: Diagnosis not present

## 2023-03-20 DIAGNOSIS — R52 Pain, unspecified: Secondary | ICD-10-CM | POA: Diagnosis not present

## 2023-03-20 DIAGNOSIS — I10 Essential (primary) hypertension: Secondary | ICD-10-CM | POA: Diagnosis not present

## 2023-03-20 DIAGNOSIS — M6281 Muscle weakness (generalized): Secondary | ICD-10-CM | POA: Diagnosis not present

## 2023-03-21 DIAGNOSIS — G309 Alzheimer's disease, unspecified: Secondary | ICD-10-CM | POA: Diagnosis not present

## 2023-03-21 DIAGNOSIS — R52 Pain, unspecified: Secondary | ICD-10-CM | POA: Diagnosis not present

## 2023-03-21 DIAGNOSIS — M6281 Muscle weakness (generalized): Secondary | ICD-10-CM | POA: Diagnosis not present

## 2023-03-22 DIAGNOSIS — G309 Alzheimer's disease, unspecified: Secondary | ICD-10-CM | POA: Diagnosis not present

## 2023-03-22 DIAGNOSIS — R52 Pain, unspecified: Secondary | ICD-10-CM | POA: Diagnosis not present

## 2023-03-22 DIAGNOSIS — M6281 Muscle weakness (generalized): Secondary | ICD-10-CM | POA: Diagnosis not present

## 2023-03-23 DIAGNOSIS — M6281 Muscle weakness (generalized): Secondary | ICD-10-CM | POA: Diagnosis not present

## 2023-03-23 DIAGNOSIS — G309 Alzheimer's disease, unspecified: Secondary | ICD-10-CM | POA: Diagnosis not present

## 2023-03-23 DIAGNOSIS — R52 Pain, unspecified: Secondary | ICD-10-CM | POA: Diagnosis not present

## 2023-03-24 DIAGNOSIS — M6281 Muscle weakness (generalized): Secondary | ICD-10-CM | POA: Diagnosis not present

## 2023-03-24 DIAGNOSIS — G309 Alzheimer's disease, unspecified: Secondary | ICD-10-CM | POA: Diagnosis not present

## 2023-03-24 DIAGNOSIS — R52 Pain, unspecified: Secondary | ICD-10-CM | POA: Diagnosis not present

## 2023-03-27 DIAGNOSIS — M6281 Muscle weakness (generalized): Secondary | ICD-10-CM | POA: Diagnosis not present

## 2023-03-27 DIAGNOSIS — G309 Alzheimer's disease, unspecified: Secondary | ICD-10-CM | POA: Diagnosis not present

## 2023-03-27 DIAGNOSIS — R52 Pain, unspecified: Secondary | ICD-10-CM | POA: Diagnosis not present

## 2023-03-30 DIAGNOSIS — Z23 Encounter for immunization: Secondary | ICD-10-CM | POA: Diagnosis not present

## 2023-04-18 DIAGNOSIS — G309 Alzheimer's disease, unspecified: Secondary | ICD-10-CM | POA: Diagnosis not present

## 2023-04-18 DIAGNOSIS — F32A Depression, unspecified: Secondary | ICD-10-CM | POA: Diagnosis not present

## 2023-05-02 DIAGNOSIS — Z79899 Other long term (current) drug therapy: Secondary | ICD-10-CM | POA: Diagnosis not present

## 2023-05-02 DIAGNOSIS — L400 Psoriasis vulgaris: Secondary | ICD-10-CM | POA: Diagnosis not present

## 2023-05-17 DIAGNOSIS — I10 Essential (primary) hypertension: Secondary | ICD-10-CM | POA: Diagnosis not present

## 2023-05-17 DIAGNOSIS — L409 Psoriasis, unspecified: Secondary | ICD-10-CM | POA: Diagnosis not present

## 2023-05-17 DIAGNOSIS — F32A Depression, unspecified: Secondary | ICD-10-CM | POA: Diagnosis not present

## 2023-05-17 DIAGNOSIS — F0390 Unspecified dementia without behavioral disturbance: Secondary | ICD-10-CM | POA: Diagnosis not present

## 2023-05-19 DIAGNOSIS — J449 Chronic obstructive pulmonary disease, unspecified: Secondary | ICD-10-CM | POA: Diagnosis not present

## 2023-05-19 DIAGNOSIS — R41841 Cognitive communication deficit: Secondary | ICD-10-CM | POA: Diagnosis not present

## 2023-05-19 DIAGNOSIS — F02A0 Dementia in other diseases classified elsewhere, mild, without behavioral disturbance, psychotic disturbance, mood disturbance, and anxiety: Secondary | ICD-10-CM | POA: Diagnosis not present

## 2023-05-19 DIAGNOSIS — G309 Alzheimer's disease, unspecified: Secondary | ICD-10-CM | POA: Diagnosis not present

## 2023-05-19 DIAGNOSIS — M6281 Muscle weakness (generalized): Secondary | ICD-10-CM | POA: Diagnosis not present

## 2023-05-22 DIAGNOSIS — E559 Vitamin D deficiency, unspecified: Secondary | ICD-10-CM | POA: Diagnosis not present

## 2023-05-22 DIAGNOSIS — I10 Essential (primary) hypertension: Secondary | ICD-10-CM | POA: Diagnosis not present

## 2023-05-22 DIAGNOSIS — N390 Urinary tract infection, site not specified: Secondary | ICD-10-CM | POA: Diagnosis not present

## 2023-05-23 DIAGNOSIS — G309 Alzheimer's disease, unspecified: Secondary | ICD-10-CM | POA: Diagnosis not present

## 2023-05-23 DIAGNOSIS — D72829 Elevated white blood cell count, unspecified: Secondary | ICD-10-CM | POA: Diagnosis not present

## 2023-05-23 DIAGNOSIS — F02A0 Dementia in other diseases classified elsewhere, mild, without behavioral disturbance, psychotic disturbance, mood disturbance, and anxiety: Secondary | ICD-10-CM | POA: Diagnosis not present

## 2023-05-23 DIAGNOSIS — R41841 Cognitive communication deficit: Secondary | ICD-10-CM | POA: Diagnosis not present

## 2023-05-23 DIAGNOSIS — M6281 Muscle weakness (generalized): Secondary | ICD-10-CM | POA: Diagnosis not present

## 2023-05-23 DIAGNOSIS — F32A Depression, unspecified: Secondary | ICD-10-CM | POA: Diagnosis not present

## 2023-05-23 DIAGNOSIS — J449 Chronic obstructive pulmonary disease, unspecified: Secondary | ICD-10-CM | POA: Diagnosis not present

## 2023-05-24 DIAGNOSIS — N390 Urinary tract infection, site not specified: Secondary | ICD-10-CM | POA: Diagnosis not present

## 2023-05-25 DIAGNOSIS — R41841 Cognitive communication deficit: Secondary | ICD-10-CM | POA: Diagnosis not present

## 2023-05-25 DIAGNOSIS — G309 Alzheimer's disease, unspecified: Secondary | ICD-10-CM | POA: Diagnosis not present

## 2023-05-25 DIAGNOSIS — F02A0 Dementia in other diseases classified elsewhere, mild, without behavioral disturbance, psychotic disturbance, mood disturbance, and anxiety: Secondary | ICD-10-CM | POA: Diagnosis not present

## 2023-05-25 DIAGNOSIS — J449 Chronic obstructive pulmonary disease, unspecified: Secondary | ICD-10-CM | POA: Diagnosis not present

## 2023-05-25 DIAGNOSIS — M6281 Muscle weakness (generalized): Secondary | ICD-10-CM | POA: Diagnosis not present

## 2023-05-30 DIAGNOSIS — G309 Alzheimer's disease, unspecified: Secondary | ICD-10-CM | POA: Diagnosis not present

## 2023-05-30 DIAGNOSIS — M6281 Muscle weakness (generalized): Secondary | ICD-10-CM | POA: Diagnosis not present

## 2023-05-30 DIAGNOSIS — J449 Chronic obstructive pulmonary disease, unspecified: Secondary | ICD-10-CM | POA: Diagnosis not present

## 2023-05-30 DIAGNOSIS — N390 Urinary tract infection, site not specified: Secondary | ICD-10-CM | POA: Diagnosis not present

## 2023-05-30 DIAGNOSIS — F02A0 Dementia in other diseases classified elsewhere, mild, without behavioral disturbance, psychotic disturbance, mood disturbance, and anxiety: Secondary | ICD-10-CM | POA: Diagnosis not present

## 2023-05-30 DIAGNOSIS — R41841 Cognitive communication deficit: Secondary | ICD-10-CM | POA: Diagnosis not present

## 2023-05-31 DIAGNOSIS — M6281 Muscle weakness (generalized): Secondary | ICD-10-CM | POA: Diagnosis not present

## 2023-05-31 DIAGNOSIS — J449 Chronic obstructive pulmonary disease, unspecified: Secondary | ICD-10-CM | POA: Diagnosis not present

## 2023-05-31 DIAGNOSIS — R41841 Cognitive communication deficit: Secondary | ICD-10-CM | POA: Diagnosis not present

## 2023-05-31 DIAGNOSIS — G309 Alzheimer's disease, unspecified: Secondary | ICD-10-CM | POA: Diagnosis not present

## 2023-05-31 DIAGNOSIS — F02A0 Dementia in other diseases classified elsewhere, mild, without behavioral disturbance, psychotic disturbance, mood disturbance, and anxiety: Secondary | ICD-10-CM | POA: Diagnosis not present

## 2023-06-01 DIAGNOSIS — F02A0 Dementia in other diseases classified elsewhere, mild, without behavioral disturbance, psychotic disturbance, mood disturbance, and anxiety: Secondary | ICD-10-CM | POA: Diagnosis not present

## 2023-06-01 DIAGNOSIS — R41841 Cognitive communication deficit: Secondary | ICD-10-CM | POA: Diagnosis not present

## 2023-06-01 DIAGNOSIS — J449 Chronic obstructive pulmonary disease, unspecified: Secondary | ICD-10-CM | POA: Diagnosis not present

## 2023-06-01 DIAGNOSIS — M6281 Muscle weakness (generalized): Secondary | ICD-10-CM | POA: Diagnosis not present

## 2023-06-01 DIAGNOSIS — G309 Alzheimer's disease, unspecified: Secondary | ICD-10-CM | POA: Diagnosis not present

## 2023-06-04 DIAGNOSIS — J449 Chronic obstructive pulmonary disease, unspecified: Secondary | ICD-10-CM | POA: Diagnosis not present

## 2023-06-04 DIAGNOSIS — G309 Alzheimer's disease, unspecified: Secondary | ICD-10-CM | POA: Diagnosis not present

## 2023-06-04 DIAGNOSIS — M6281 Muscle weakness (generalized): Secondary | ICD-10-CM | POA: Diagnosis not present

## 2023-06-04 DIAGNOSIS — R41841 Cognitive communication deficit: Secondary | ICD-10-CM | POA: Diagnosis not present

## 2023-06-04 DIAGNOSIS — F02A0 Dementia in other diseases classified elsewhere, mild, without behavioral disturbance, psychotic disturbance, mood disturbance, and anxiety: Secondary | ICD-10-CM | POA: Diagnosis not present

## 2023-06-05 DIAGNOSIS — J449 Chronic obstructive pulmonary disease, unspecified: Secondary | ICD-10-CM | POA: Diagnosis not present

## 2023-06-05 DIAGNOSIS — F02A0 Dementia in other diseases classified elsewhere, mild, without behavioral disturbance, psychotic disturbance, mood disturbance, and anxiety: Secondary | ICD-10-CM | POA: Diagnosis not present

## 2023-06-05 DIAGNOSIS — G309 Alzheimer's disease, unspecified: Secondary | ICD-10-CM | POA: Diagnosis not present

## 2023-06-05 DIAGNOSIS — R41841 Cognitive communication deficit: Secondary | ICD-10-CM | POA: Diagnosis not present

## 2023-06-05 DIAGNOSIS — M6281 Muscle weakness (generalized): Secondary | ICD-10-CM | POA: Diagnosis not present

## 2023-06-07 DIAGNOSIS — G309 Alzheimer's disease, unspecified: Secondary | ICD-10-CM | POA: Diagnosis not present

## 2023-06-07 DIAGNOSIS — M6281 Muscle weakness (generalized): Secondary | ICD-10-CM | POA: Diagnosis not present

## 2023-06-07 DIAGNOSIS — R41841 Cognitive communication deficit: Secondary | ICD-10-CM | POA: Diagnosis not present

## 2023-06-07 DIAGNOSIS — J449 Chronic obstructive pulmonary disease, unspecified: Secondary | ICD-10-CM | POA: Diagnosis not present

## 2023-06-07 DIAGNOSIS — F02A0 Dementia in other diseases classified elsewhere, mild, without behavioral disturbance, psychotic disturbance, mood disturbance, and anxiety: Secondary | ICD-10-CM | POA: Diagnosis not present

## 2023-06-09 DIAGNOSIS — Z23 Encounter for immunization: Secondary | ICD-10-CM | POA: Diagnosis not present

## 2023-06-12 DIAGNOSIS — G309 Alzheimer's disease, unspecified: Secondary | ICD-10-CM | POA: Diagnosis not present

## 2023-06-12 DIAGNOSIS — F02A0 Dementia in other diseases classified elsewhere, mild, without behavioral disturbance, psychotic disturbance, mood disturbance, and anxiety: Secondary | ICD-10-CM | POA: Diagnosis not present

## 2023-06-12 DIAGNOSIS — M6281 Muscle weakness (generalized): Secondary | ICD-10-CM | POA: Diagnosis not present

## 2023-06-12 DIAGNOSIS — J449 Chronic obstructive pulmonary disease, unspecified: Secondary | ICD-10-CM | POA: Diagnosis not present

## 2023-06-12 DIAGNOSIS — R41841 Cognitive communication deficit: Secondary | ICD-10-CM | POA: Diagnosis not present

## 2023-06-14 DIAGNOSIS — F02A0 Dementia in other diseases classified elsewhere, mild, without behavioral disturbance, psychotic disturbance, mood disturbance, and anxiety: Secondary | ICD-10-CM | POA: Diagnosis not present

## 2023-06-14 DIAGNOSIS — J449 Chronic obstructive pulmonary disease, unspecified: Secondary | ICD-10-CM | POA: Diagnosis not present

## 2023-06-14 DIAGNOSIS — R41841 Cognitive communication deficit: Secondary | ICD-10-CM | POA: Diagnosis not present

## 2023-06-14 DIAGNOSIS — G309 Alzheimer's disease, unspecified: Secondary | ICD-10-CM | POA: Diagnosis not present

## 2023-06-20 DIAGNOSIS — F32A Depression, unspecified: Secondary | ICD-10-CM | POA: Diagnosis not present

## 2023-06-20 DIAGNOSIS — G309 Alzheimer's disease, unspecified: Secondary | ICD-10-CM | POA: Diagnosis not present

## 2023-07-17 DIAGNOSIS — F32A Depression, unspecified: Secondary | ICD-10-CM | POA: Diagnosis not present

## 2023-07-17 DIAGNOSIS — F0390 Unspecified dementia without behavioral disturbance: Secondary | ICD-10-CM | POA: Diagnosis not present

## 2023-07-17 DIAGNOSIS — I10 Essential (primary) hypertension: Secondary | ICD-10-CM | POA: Diagnosis not present

## 2023-07-17 DIAGNOSIS — L409 Psoriasis, unspecified: Secondary | ICD-10-CM | POA: Diagnosis not present

## 2023-07-17 DIAGNOSIS — Z9189 Other specified personal risk factors, not elsewhere classified: Secondary | ICD-10-CM | POA: Diagnosis not present

## 2023-08-01 DIAGNOSIS — G309 Alzheimer's disease, unspecified: Secondary | ICD-10-CM | POA: Diagnosis not present

## 2023-08-01 DIAGNOSIS — F32A Depression, unspecified: Secondary | ICD-10-CM | POA: Diagnosis not present

## 2023-08-19 DIAGNOSIS — F02A0 Dementia in other diseases classified elsewhere, mild, without behavioral disturbance, psychotic disturbance, mood disturbance, and anxiety: Secondary | ICD-10-CM | POA: Diagnosis not present

## 2023-08-19 DIAGNOSIS — M6281 Muscle weakness (generalized): Secondary | ICD-10-CM | POA: Diagnosis not present
